# Patient Record
Sex: Male | Born: 1980 | Hispanic: No | Marital: Married | Smoking: Current every day smoker
Health system: Southern US, Community
[De-identification: ages and names within clinical notes are randomized; demographics above are authoritative.]

## PROBLEM LIST (undated history)

## (undated) DIAGNOSIS — H5440 Blindness, one eye, unspecified eye: Secondary | ICD-10-CM

## (undated) DIAGNOSIS — I10 Essential (primary) hypertension: Secondary | ICD-10-CM

## (undated) DIAGNOSIS — J45909 Unspecified asthma, uncomplicated: Secondary | ICD-10-CM

## (undated) DIAGNOSIS — I2510 Atherosclerotic heart disease of native coronary artery without angina pectoris: Secondary | ICD-10-CM

## (undated) DIAGNOSIS — F419 Anxiety disorder, unspecified: Secondary | ICD-10-CM

## (undated) DIAGNOSIS — R0683 Snoring: Secondary | ICD-10-CM

## (undated) DIAGNOSIS — F32A Depression, unspecified: Secondary | ICD-10-CM

## (undated) DIAGNOSIS — E7800 Pure hypercholesterolemia, unspecified: Secondary | ICD-10-CM

## (undated) DIAGNOSIS — Z720 Tobacco use: Secondary | ICD-10-CM

## (undated) DIAGNOSIS — I219 Acute myocardial infarction, unspecified: Secondary | ICD-10-CM

## (undated) DIAGNOSIS — I739 Peripheral vascular disease, unspecified: Secondary | ICD-10-CM

## (undated) DIAGNOSIS — K648 Other hemorrhoids: Secondary | ICD-10-CM

## (undated) DIAGNOSIS — R202 Paresthesia of skin: Secondary | ICD-10-CM

## (undated) DIAGNOSIS — M1990 Unspecified osteoarthritis, unspecified site: Secondary | ICD-10-CM

## (undated) DIAGNOSIS — R7302 Impaired glucose tolerance (oral): Secondary | ICD-10-CM

## (undated) DIAGNOSIS — K644 Residual hemorrhoidal skin tags: Secondary | ICD-10-CM

## (undated) HISTORY — DX: Depression, unspecified: F32.A

## (undated) HISTORY — DX: Impaired glucose tolerance (oral): R73.02

## (undated) HISTORY — DX: Snoring: R06.83

## (undated) HISTORY — DX: Peripheral vascular disease, unspecified: I73.9

## (undated) HISTORY — DX: Anxiety disorder, unspecified: F41.9

## (undated) HISTORY — DX: Paresthesia of skin: R20.2

## (undated) HISTORY — DX: Other hemorrhoids: K64.8

## (undated) HISTORY — PX: OTHER SURGICAL HISTORY: SHX169

## (undated) HISTORY — DX: Residual hemorrhoidal skin tags: K64.4

## (undated) HISTORY — PX: CORONARY ANGIOPLASTY: SHX604

## (undated) HISTORY — DX: Blindness, one eye, unspecified eye: H54.40

## (undated) HISTORY — DX: Essential (primary) hypertension: I10

## (undated) HISTORY — DX: Pure hypercholesterolemia, unspecified: E78.00

## (undated) HISTORY — PX: CARDIAC CATHETERIZATION: SHX172

## (undated) HISTORY — DX: Atherosclerotic heart disease of native coronary artery without angina pectoris: I25.10

## (undated) HISTORY — DX: Tobacco use: Z72.0

---

## 2017-03-07 DIAGNOSIS — E7800 Pure hypercholesterolemia, unspecified: Secondary | ICD-10-CM | POA: Insufficient documentation

## 2017-03-07 DIAGNOSIS — I10 Essential (primary) hypertension: Secondary | ICD-10-CM | POA: Insufficient documentation

## 2017-03-07 DIAGNOSIS — Z720 Tobacco use: Secondary | ICD-10-CM

## 2017-03-07 HISTORY — DX: Pure hypercholesterolemia, unspecified: E78.00

## 2017-03-07 HISTORY — DX: Tobacco use: Z72.0

## 2017-03-07 HISTORY — DX: Essential (primary) hypertension: I10

## 2017-03-15 DIAGNOSIS — R7302 Impaired glucose tolerance (oral): Secondary | ICD-10-CM

## 2017-03-15 HISTORY — DX: Impaired glucose tolerance (oral): R73.02

## 2017-04-02 DIAGNOSIS — R202 Paresthesia of skin: Secondary | ICD-10-CM

## 2017-04-02 DIAGNOSIS — I739 Peripheral vascular disease, unspecified: Secondary | ICD-10-CM

## 2017-04-02 DIAGNOSIS — I2510 Atherosclerotic heart disease of native coronary artery without angina pectoris: Secondary | ICD-10-CM

## 2017-04-02 HISTORY — DX: Paresthesia of skin: R20.2

## 2017-04-02 HISTORY — DX: Peripheral vascular disease, unspecified: I73.9

## 2017-04-02 HISTORY — DX: Atherosclerotic heart disease of native coronary artery without angina pectoris: I25.10

## 2017-09-18 DIAGNOSIS — K648 Other hemorrhoids: Secondary | ICD-10-CM

## 2017-09-18 DIAGNOSIS — K644 Residual hemorrhoidal skin tags: Secondary | ICD-10-CM

## 2017-09-18 HISTORY — DX: Other hemorrhoids: K64.8

## 2017-09-18 HISTORY — DX: Residual hemorrhoidal skin tags: K64.4

## 2018-02-25 ENCOUNTER — Other Ambulatory Visit: Payer: Self-pay

## 2018-02-25 ENCOUNTER — Emergency Department (HOSPITAL_COMMUNITY)
Admission: EM | Admit: 2018-02-25 | Discharge: 2018-02-25 | Disposition: A | Discharge status: Home / Self Care | Payer: Self-pay | Attending: Emergency Medicine | Admitting: Emergency Medicine

## 2018-02-25 DIAGNOSIS — Z5321 Procedure and treatment not carried out due to patient leaving prior to being seen by health care provider: Secondary | ICD-10-CM | POA: Insufficient documentation

## 2018-02-25 DIAGNOSIS — H538 Other visual disturbances: Secondary | ICD-10-CM | POA: Insufficient documentation
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
## 2018-02-25 NOTE — ED Triage Notes (Signed)
Pt reports that he has had impaired vision in his left eye for the last 2 years that began from kidney failure. Pt reports due to financial reasons he has not seen a doctor for over 1 year about his eye. Pt reports on Thursday he has smoking vision on the bottom of his vision field but no other sight in that eye, Friday morning he woke up with no vision in his left eye.

## 2018-02-25 NOTE — ED Provider Notes (Cosign Needed)
Patient placed in Quick Look pathway, seen and evaluated   Chief Complaint: loss of vision left eye  HPI:  Notes Pt reports that he has had impaired vision in his left eye for the last 2 years that began from kidney failure. Pt reports due to financial reasons he has not seen a doctor for over 1 year about his eye. Pt reports on Thursday he has smoking vision on the bottom of his vision field but no other sight in that eye, Friday morning he woke up with no vision in his left eye. patient reports headache for the the past couple weeks. He headache has been frontal and behind the left eye.   ROS: Eyes: loss of vision left  Physical Exam:  BP (!) 167/110 (BP Location: Right Arm)   Pulse 95   Temp 98 F (36.7 C) (Oral)   Resp 16   SpO2 97%    Gen: No distress  Neuro: Awake and Alert  Skin: Warm and dry    Focused Exam:    Initiation of care has begun. The patient has been counseled on the process, plan, and necessity for staying for the completion/evaluation, and the remainder of the medical screening examination      Janne Napoleoneese, Hope M, NP 02/25/18 1401

## 2018-02-25 NOTE — ED Notes (Addendum)
Pt's family member came to desk and notified RN that pt is leaving. RN tried to convince pt to stay. He decided to leave

## 2018-02-26 ENCOUNTER — Encounter (HOSPITAL_COMMUNITY): Payer: Self-pay | Admitting: *Deleted

## 2018-02-26 ENCOUNTER — Other Ambulatory Visit (HOSPITAL_BASED_OUTPATIENT_CLINIC_OR_DEPARTMENT_OTHER): Payer: Self-pay

## 2018-02-26 ENCOUNTER — Other Ambulatory Visit: Payer: Self-pay

## 2018-02-26 ENCOUNTER — Emergency Department (HOSPITAL_COMMUNITY): Payer: Self-pay

## 2018-02-26 DIAGNOSIS — G4730 Sleep apnea, unspecified: Secondary | ICD-10-CM

## 2018-02-26 DIAGNOSIS — G4700 Insomnia, unspecified: Secondary | ICD-10-CM

## 2018-02-26 DIAGNOSIS — G4710 Hypersomnia, unspecified: Secondary | ICD-10-CM

## 2018-02-26 DIAGNOSIS — R51 Headache: Secondary | ICD-10-CM | POA: Insufficient documentation

## 2018-02-26 DIAGNOSIS — G2581 Restless legs syndrome: Secondary | ICD-10-CM

## 2018-02-26 DIAGNOSIS — R454 Irritability and anger: Secondary | ICD-10-CM

## 2018-02-26 DIAGNOSIS — Z5321 Procedure and treatment not carried out due to patient leaving prior to being seen by health care provider: Secondary | ICD-10-CM | POA: Insufficient documentation

## 2018-02-26 DIAGNOSIS — R0683 Snoring: Secondary | ICD-10-CM

## 2018-02-26 DIAGNOSIS — R5383 Other fatigue: Secondary | ICD-10-CM

## 2018-02-26 IMAGING — CT CT HEAD W/O CM
3 series · 16 of 47 positions shown, 19 images · non-contrast
Comparison: None.

CLINICAL DATA: Visual loss or uveitis/scleritis. Frontal headache.
Elevated blood pressure.

EXAM:
CT HEAD WITHOUT CONTRAST
TECHNIQUE: Contiguous axial images were obtained from the base of the skull
through the vertex without intravenous contrast.

[Series 2: head wo · axial · 0.49mm/px · z∈[+1264,+1399]mm · 10 of 33 slices shown, 13 images]
[im 3/33  brain]
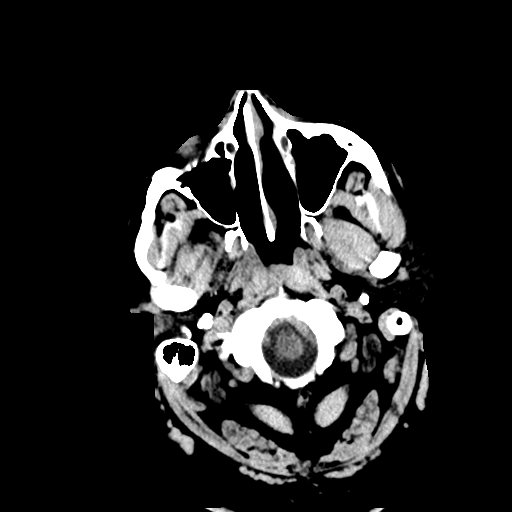
[im 3/33  bone]
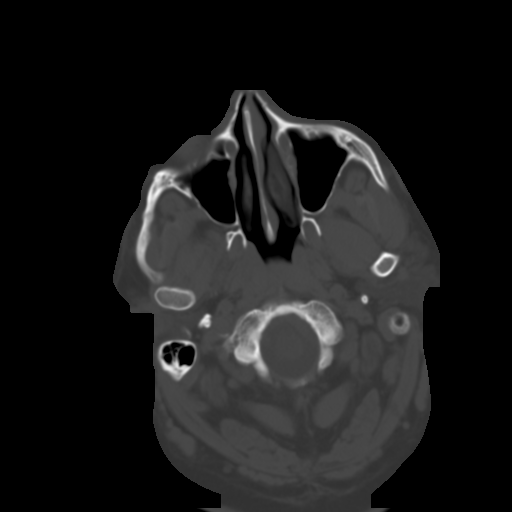
[im 6/33  brain]
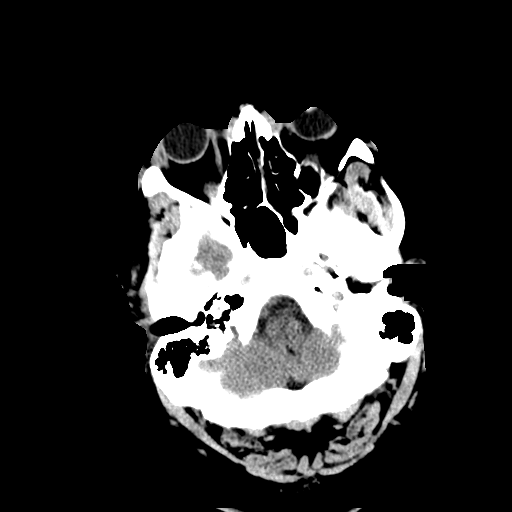
[im 9/33  brain]
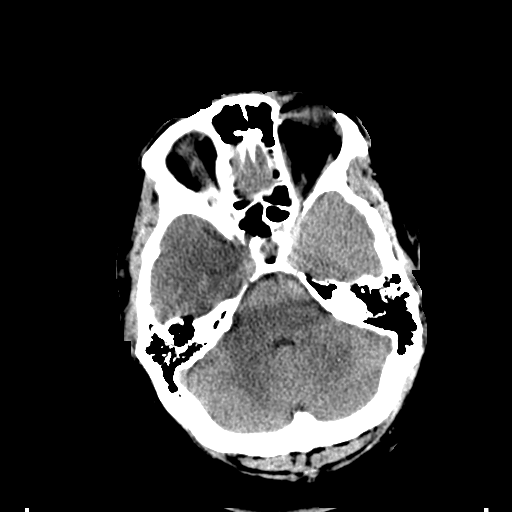
[im 12/33  brain]
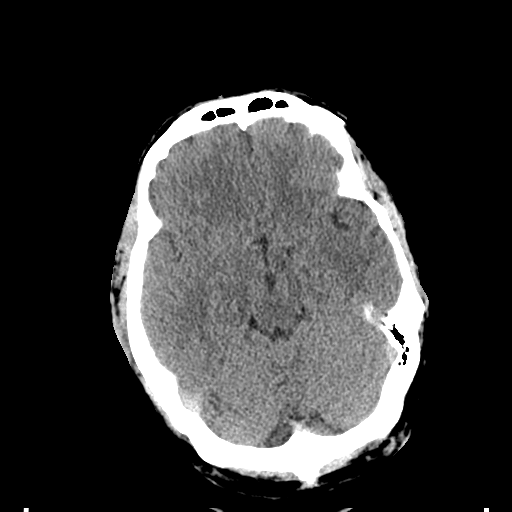
[im 15/33  brain]
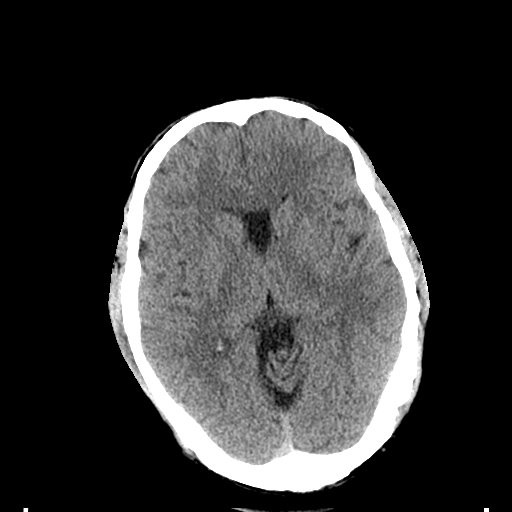
[im 15/33  bone]
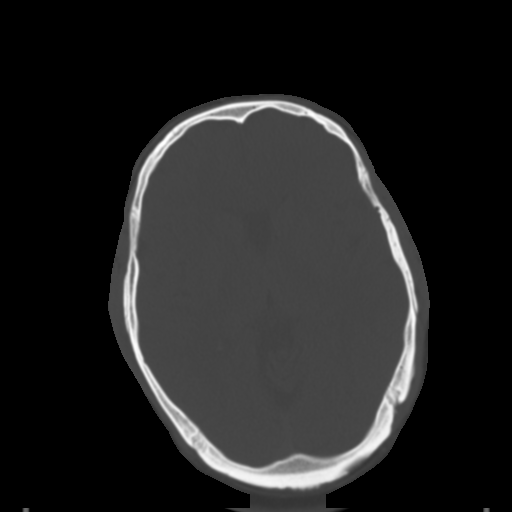
[im 18/33  brain]
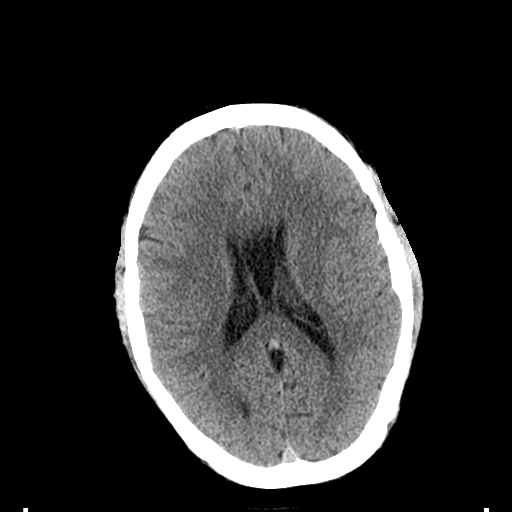
[im 21/33  brain]
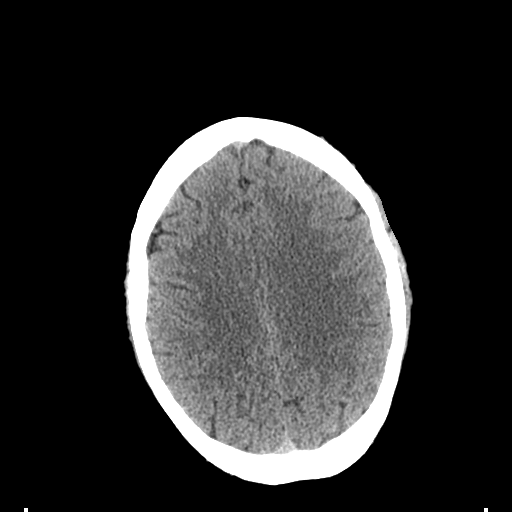
[im 25/33  brain]
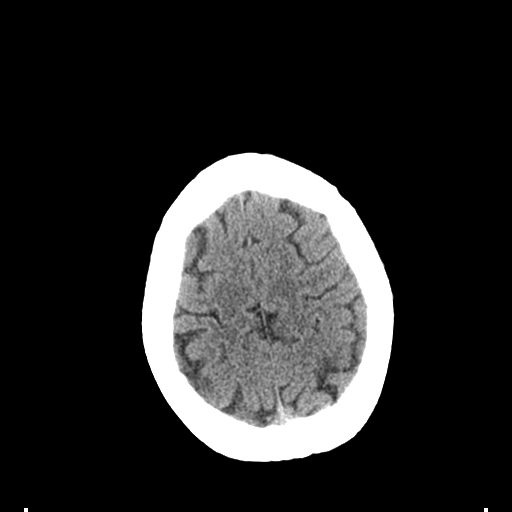
[im 27/33  brain]
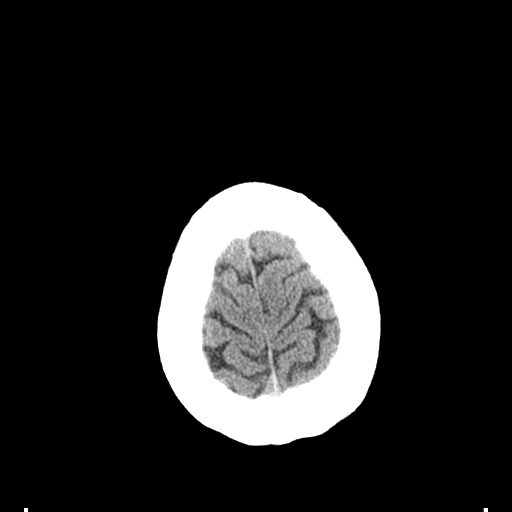
[im 27/33  bone]
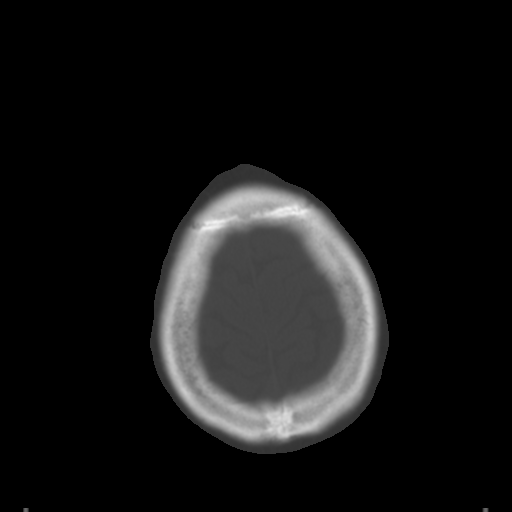
[im 30/33  brain]
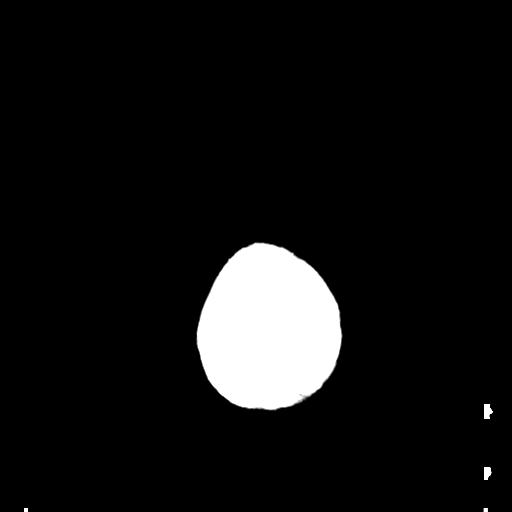

[Series 4: coronal soft tissue · coronal · 0.31mm/px · 3 of 70 slices shown]
[im 24/70  brain]
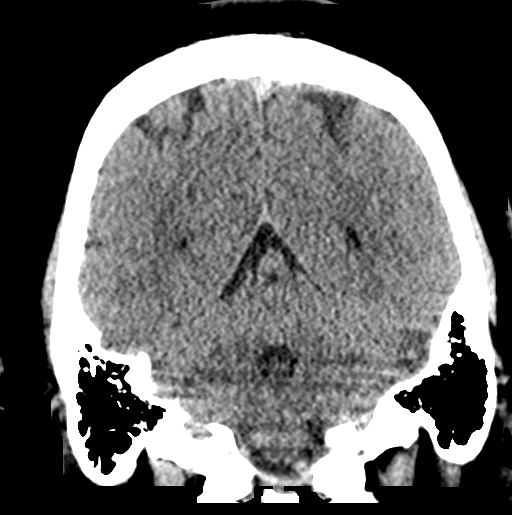
[im 31/70  brain]
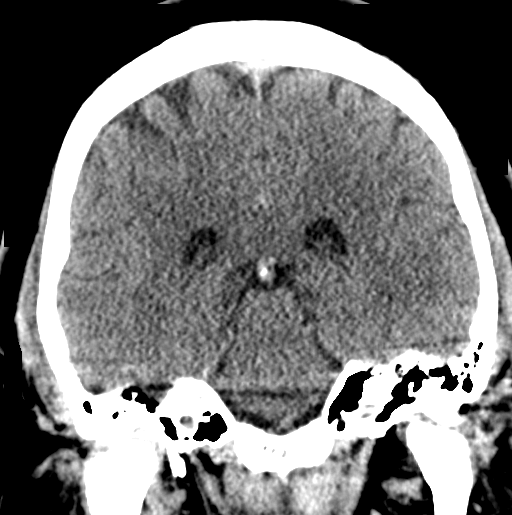
[im 39/70  brain]
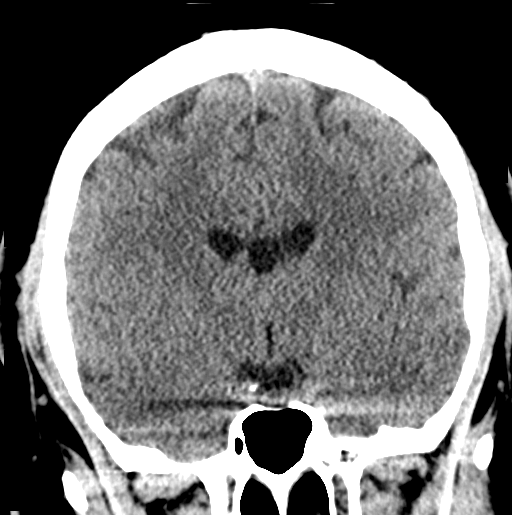

[Series 5: sagittal soft tissue · sagittal · 0.31mm/px · 3 of 54 slices shown]
[im 18/54  brain]
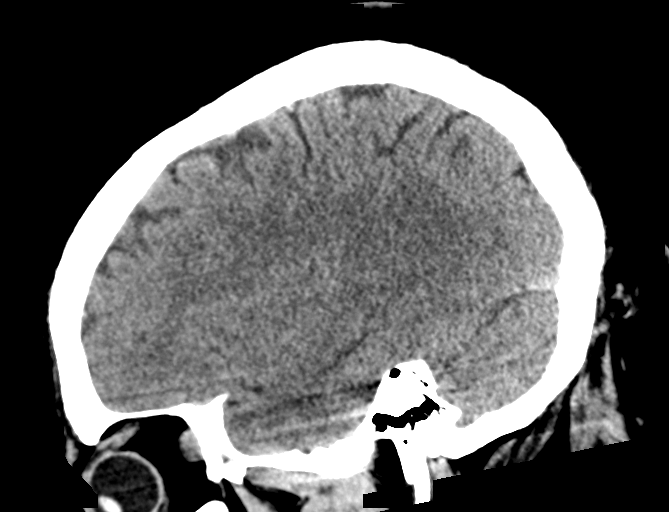
[im 27/54  brain]
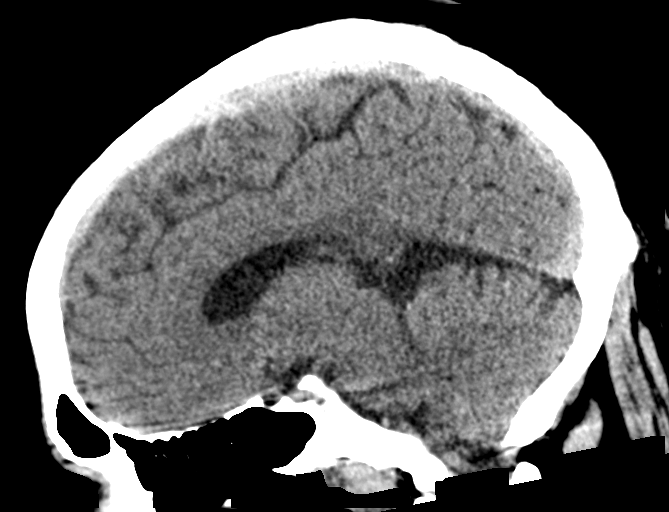
[im 36/54  brain]
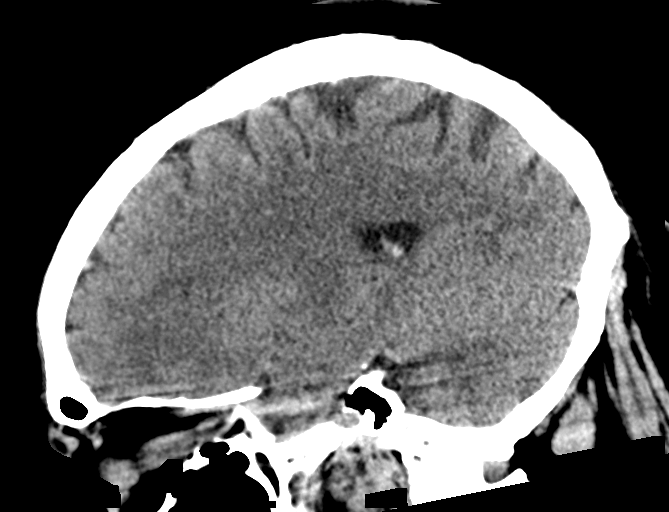

[16 of 47 positions shown; findings below may reference images not displayed]

FINDINGS: Brain: No intracranial hemorrhage, mass effect, or midline shift. No
hydrocephalus. Cavum septum pellucidum, normal variant. The basilar
cisterns are patent. No evidence of territorial infarct or acute
ischemia. No extra-axial or intracranial fluid collection.

Vascular: No hyperdense vessel or unexpected calcification.

Skull: Normal. Negative for fracture or focal lesion.

Sinuses/Orbits: Paranasal sinuses and mastoid air cells are clear.
The visualized orbits are unremarkable. No evidence of orbital
inflammation.

Other: None.
IMPRESSION: Unremarkable noncontrast head CT.

## 2018-02-26 NOTE — ED Notes (Signed)
[facility] EDP made aware of pt's CC.  Will order head CT

## 2018-02-26 NOTE — ED Triage Notes (Signed)
Pt reports frontal head pressure with L vision loss Friday.  [facility], waited for [facility] hours.  He went home without being seen.  Called his PCP today and was instructed to come to the ED.  Pt is hypertensive, has hx of HTN and has taken his med today.  Pt is A&Ox 4.

## 2018-02-27 ENCOUNTER — Emergency Department (HOSPITAL_COMMUNITY)
Admission: EM | Admit: 2018-02-27 | Discharge: 2018-02-27 | Discharge status: Against Medical Advice | Payer: Self-pay | Attending: Emergency Medicine | Admitting: Emergency Medicine

## 2018-02-27 ENCOUNTER — Emergency Department (HOSPITAL_COMMUNITY)
Admission: EM | Admit: 2018-02-27 | Discharge: 2018-02-27 | Disposition: A | Discharge status: Home / Self Care | Payer: Self-pay | Attending: Emergency Medicine | Admitting: Emergency Medicine

## 2018-02-27 ENCOUNTER — Encounter (HOSPITAL_COMMUNITY): Payer: Self-pay | Admitting: Emergency Medicine

## 2018-02-27 DIAGNOSIS — Z79899 Other long term (current) drug therapy: Secondary | ICD-10-CM | POA: Insufficient documentation

## 2018-02-27 DIAGNOSIS — I10 Essential (primary) hypertension: Secondary | ICD-10-CM | POA: Insufficient documentation

## 2018-02-27 DIAGNOSIS — Z7902 Long term (current) use of antithrombotics/antiplatelets: Secondary | ICD-10-CM | POA: Insufficient documentation

## 2018-02-27 DIAGNOSIS — H5462 Unqualified visual loss, left eye, normal vision right eye: Secondary | ICD-10-CM | POA: Insufficient documentation

## 2018-02-27 DIAGNOSIS — F17210 Nicotine dependence, cigarettes, uncomplicated: Secondary | ICD-10-CM | POA: Insufficient documentation

## 2018-02-27 DIAGNOSIS — J45909 Unspecified asthma, uncomplicated: Secondary | ICD-10-CM | POA: Insufficient documentation

## 2018-02-27 HISTORY — DX: Acute myocardial infarction, unspecified: I21.9

## 2018-02-27 HISTORY — DX: Unspecified osteoarthritis, unspecified site: M19.90

## 2018-02-27 HISTORY — DX: Essential (primary) hypertension: I10

## 2018-02-27 HISTORY — DX: Unspecified asthma, uncomplicated: J45.909

## 2018-02-27 LAB — CBC WITH DIFFERENTIAL/PLATELET
BASOS ABS: 0 10*3/uL (ref 0.0–0.1)
BASOS PCT: 0 %
EOS ABS: 0.5 10*3/uL (ref 0.0–0.7)
Eosinophils Relative: 4 %
HCT: 44.5 % (ref 39.0–52.0)
HEMOGLOBIN: 15.3 g/dL (ref 13.0–17.0)
Lymphocytes Relative: 34 %
Lymphs Abs: 4.4 10*3/uL — ABNORMAL HIGH (ref 0.7–4.0)
MCH: 30.8 pg (ref 26.0–34.0)
MCHC: 34.4 g/dL (ref 30.0–36.0)
MCV: 89.7 fL (ref 78.0–100.0)
Monocytes Absolute: 0.7 10*3/uL (ref 0.1–1.0)
Monocytes Relative: 6 %
NEUTROS PCT: 56 %
Neutro Abs: 7.3 10*3/uL (ref 1.7–7.7)
PLATELETS: 303 10*3/uL (ref 150–400)
RBC: 4.96 MIL/uL (ref 4.22–5.81)
RDW: 13.6 % (ref 11.5–15.5)
WBC: 13 10*3/uL — ABNORMAL HIGH (ref 4.0–10.5)

## 2018-02-27 LAB — COMPREHENSIVE METABOLIC PANEL
ALBUMIN: 4.1 g/dL (ref 3.5–5.0)
ALT: 35 U/L (ref 17–63)
ANION GAP: 9 (ref 5–15)
AST: 26 U/L (ref 15–41)
Alkaline Phosphatase: 84 U/L (ref 38–126)
BUN: 11 mg/dL (ref 6–20)
CO2: 27 mmol/L (ref 22–32)
Calcium: 9.3 mg/dL (ref 8.9–10.3)
Chloride: 103 mmol/L (ref 101–111)
Creatinine, Ser: 0.85 mg/dL (ref 0.61–1.24)
GFR calc non Af Amer: >60 mL/min (ref >60)
GLUCOSE: 192 mg/dL — AB (ref 65–99)
POTASSIUM: 3.7 mmol/L (ref 3.5–5.1)
SODIUM: 139 mmol/L (ref 135–145)
TOTAL PROTEIN: 7.7 g/dL (ref 6.5–8.1)
Total Bilirubin: 0.4 mg/dL (ref 0.3–1.2)

## 2018-02-27 LAB — APTT: aPTT: 32 seconds (ref 24–36)

## 2018-02-27 LAB — I-STAT TROPONIN, ED: Troponin i, poc: 0.03 ng/mL (ref 0.00–0.08)

## 2018-02-27 LAB — PROTIME-INR
INR: 0.94
PROTHROMBIN TIME: 12.5 s (ref 11.4–15.2)

## 2018-02-27 LAB — CBG MONITORING, ED: GLUCOSE-CAPILLARY: 189 mg/dL — AB (ref 65–99)

## 2018-02-27 MED ORDER — TETRACAINE HCL 0.5 % OP SOLN
1.0000 [drp] | Freq: Once | OPHTHALMIC | Status: AC
  Administered 2018-02-27: 1 [drp] via OPHTHALMIC
  Filled 2018-02-27: qty 4

## 2018-02-27 MED ORDER — FLUORESCEIN SODIUM 1 MG OP STRP
1.0000 | ORAL_STRIP | Freq: Once | OPHTHALMIC | Status: AC
Start: 2018-02-27 — End: 2018-02-27
  Administered 2018-02-27: 1 via OPHTHALMIC
  Filled 2018-02-27: qty 1

## 2018-02-27 NOTE — ED Provider Notes (Addendum)
Medical screening examination/treatment/procedure(s) were conducted as a shared visit with non-physician practitioner(s) and myself.  I personally evaluated the patient during the encounter.  None   Patient seen by me along with physician assistant.  Patient with known previous poor vision in left eye secondary to what he says was a retinal problem.  On Friday of this week patient had complete loss of vision in that eye.  Associated with some discomfort up in the left upper forehead area.  No distinct eye pain.  Patient also usually wears a patch routinely over the left eye when he is out in the sunlight.  Patient not followed by ophthalmology here.  Slit-lamp exam without any acute findings.  Ocular pressures are normal in both eyes.  Patient's left eye is reactive to light suggesting that is proceeding light.  Patient states he has no vision.  No obvious retinal problem on limited exam.  Will discuss with ophthalmology on-call.  Suspect patient can be seen as an outpatient based on the duration.  [facility] but left due to long wait.  Head CT had no acute abnormalities.  We have ordered basic labs here and are waiting on results.   Vanetta MuldersZackowski, Kendrick Remigio, MD 02/27/18 1805   In addition patient no acute distress.  Do not feel that the loss of vision is related to migraine.   Vanetta MuldersZackowski, Jaydence Vanyo, MD 02/27/18 1807
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
## 2018-02-27 NOTE — ED Notes (Signed)
Pt is sleeping in bed

## 2018-02-27 NOTE — ED Notes (Signed)
Pt is texting on phone. Spouse has left bedside.

## 2018-02-27 NOTE — ED Notes (Signed)
ED Provider at bedside. 

## 2018-02-27 NOTE — ED Triage Notes (Signed)
Pt repots that having headaches that have been intermittent since last Friday that is made worse by light.
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
## 2018-02-27 NOTE — ED Notes (Signed)
Pt no longer in lobby.  

## 2018-03-13 ENCOUNTER — Ambulatory Visit (HOSPITAL_BASED_OUTPATIENT_CLINIC_OR_DEPARTMENT_OTHER): Payer: Self-pay | Attending: *Deleted | Admitting: Internal Medicine

## 2018-03-13 VITALS — Ht 74.0 in | Wt 364.0 lb

## 2018-03-13 DIAGNOSIS — Z79899 Other long term (current) drug therapy: Secondary | ICD-10-CM | POA: Insufficient documentation

## 2018-03-13 DIAGNOSIS — G4733 Obstructive sleep apnea (adult) (pediatric): Secondary | ICD-10-CM | POA: Insufficient documentation

## 2018-03-13 DIAGNOSIS — E669 Obesity, unspecified: Secondary | ICD-10-CM | POA: Insufficient documentation

## 2018-03-13 DIAGNOSIS — R0683 Snoring: Secondary | ICD-10-CM | POA: Insufficient documentation

## 2018-03-13 DIAGNOSIS — G2581 Restless legs syndrome: Secondary | ICD-10-CM

## 2018-03-13 DIAGNOSIS — R454 Irritability and anger: Secondary | ICD-10-CM

## 2018-03-13 DIAGNOSIS — G4730 Sleep apnea, unspecified: Secondary | ICD-10-CM

## 2018-03-13 DIAGNOSIS — G4710 Hypersomnia, unspecified: Secondary | ICD-10-CM | POA: Insufficient documentation

## 2018-03-13 DIAGNOSIS — R5383 Other fatigue: Secondary | ICD-10-CM | POA: Insufficient documentation

## 2018-03-13 DIAGNOSIS — I10 Essential (primary) hypertension: Secondary | ICD-10-CM | POA: Insufficient documentation

## 2018-03-13 DIAGNOSIS — Z6841 Body Mass Index (BMI) 40.0 and over, adult: Secondary | ICD-10-CM | POA: Insufficient documentation

## 2018-03-13 DIAGNOSIS — I493 Ventricular premature depolarization: Secondary | ICD-10-CM | POA: Insufficient documentation

## 2018-03-13 DIAGNOSIS — G4700 Insomnia, unspecified: Secondary | ICD-10-CM

## 2018-03-13 HISTORY — DX: Snoring: R06.83

## 2018-03-23 DIAGNOSIS — R0683 Snoring: Secondary | ICD-10-CM

## 2018-03-23 NOTE — Procedures (Signed)
Patient Name: Jeffrey Barrera, Jeffrey Barrera Date: 03/13/2018 Gender: Male D.O.B: 07-Jan-1981 Age (years): 72 Referring Provider: Marliss Coots NP Height (inches): 74 Interpreting Physician: Baird Lyons MD, ABSM Weight (lbs): 364 RPSGT: Carolin Coy BMI: 64 MRN: [id number] Neck Size: 18.00  CLINICAL INFORMATION Sleep Study Type: Split Night CPAP Indication for sleep study: Excessive Daytime Sleepiness, Fatigue, Hypertension, Obesity, Snoring, Witnessed Apneas  Epworth Sleepiness Score:  5  SLEEP STUDY TECHNIQUE As per the AASM Manual for the Scoring of Sleep and Associated Events v2.3 (April 2016) with a hypopnea requiring 4% desaturations.  The channels recorded and monitored were frontal, central and occipital EEG, electrooculogram (EOG), submentalis EMG (chin), nasal and oral airflow, thoracic and abdominal wall motion, anterior tibialis EMG, snore microphone, electrocardiogram, and pulse oximetry. Continuous positive airway pressure (CPAP) was initiated when the patient met split night criteria and was titrated according to treat sleep-disordered breathing.  MEDICATIONS Medications self-administered by patient taken the night of the study : PRAZOSIN, GABAPENTIN, TRAZODONE  RESPIRATORY PARAMETERS Diagnostic  Total AHI (/hr): 20.2 RDI (/hr): 34.1 OA Index (/hr): 3.4 CA Index (/hr): 0.0 REM AHI (/hr): N/A NREM AHI (/hr): 20.2 Supine AHI (/hr): 66.7 Non-supine AHI (/hr): 18.42 Min O2 Sat (%): 88.0 Mean O2 (%): 92.8 Time below 88% (min): 0   Titration  Optimal Pressure (cm):  AHI at Optimal Pressure (/hr): N/A Min O2 at Optimal Pressure (%): 87.0 Supine % at Optimal (%): N/A Sleep % at Optimal (%): N/A   SLEEP ARCHITECTURE The recording time for the entire night was 362 minutes.  During a baseline period of 169.2 minutes, the patient slept for 125.0 minutes in REM and nonREM, yielding a sleep efficiency of 73.9%%. Sleep onset after lights out was 1.5 minutes with a REM  latency of N/A minutes. The patient spent 44.8%% of the night in stage N1 sleep, 55.2%% in stage N2 sleep, 0.0%% in stage N3 and 0.0%% in REM.  During the titration period of 185.6 minutes, the patient slept for 159.0 minutes in REM and nonREM, yielding a sleep efficiency of 85.6%%. Sleep onset after CPAP initiation was 14.2 minutes with a REM latency of 86.0 minutes. The patient spent 14.8%% of the night in stage N1 sleep, 74.8%% in stage N2 sleep, 0.0%% in stage N3 and 10.4%% in REM.  CARDIAC DATA The 2 lead EKG demonstrated sinus rhythm. The mean heart rate was 100.0 beats per minute. Other EKG findings include: PVCs.  LEG MOVEMENT DATA The total Periodic Limb Movements of Sleep (PLMS) were 0. The PLMS index was 0.0 .  IMPRESSIONS - Moderate obstructive sleep apnea occurred during the diagnostic portion of the study(AHI = 20.2/hour). - CPAP titrated to 16 cwp. - No significant central sleep apnea occurred during the diagnostic portion of the study (CAI = 0.0/hour). - Mild oxygen desaturation was noted during the diagnostic portion of the study (Min O2 = 88.0%). Min sat at CPAP16 was 93%. - The patient snored with moderate snoring volume during the diagnostic portion of the study. - EKG findings include PVCs. - Clinically significant periodic limb movements did not occur during sleep. - Sleep position on back reportedly aoided due to pain.  DIAGNOSIS - Obstructive Sleep Apnea (327.23 [G47.33 ICD-10])  RECOMMENDATIONS - Recommend trial of CPAP 16 or DME autopap 10-20. Patient wore a large Respironics Dreamwear nasal/oral mask with heated hose and humidifier. - Be careful with alcohol, sedatives and other CNS depressants that may worsen sleep apnea and disrupt normal sleep architecture. - Sleep hygiene should  be reviewed to assess factors that may improve sleep quality. - Weight management and regular exercise should be initiated or continued.  [Electronically signed] 03/23/2018 03:14  PM  Baird Lyons MD, [place], American Board of Sleep Medicine   NPI: [id number]                          [place], [place] [facility]  ELECTRONICALLY SIGNED ON:  03/23/2018, 3:09 PM [place] PH: [phone]   FX: [phone] [place]

## 2018-03-27 ENCOUNTER — Other Ambulatory Visit: Payer: Self-pay

## 2018-03-27 ENCOUNTER — Emergency Department (HOSPITAL_COMMUNITY)
Admission: EM | Admit: 2018-03-27 | Discharge: 2018-03-28 | Disposition: A | Discharge status: Home / Self Care | Payer: Self-pay | Attending: Emergency Medicine | Admitting: Emergency Medicine

## 2018-03-27 ENCOUNTER — Emergency Department (HOSPITAL_COMMUNITY): Payer: Self-pay

## 2018-03-27 ENCOUNTER — Encounter (HOSPITAL_COMMUNITY): Payer: Self-pay | Admitting: Emergency Medicine

## 2018-03-27 DIAGNOSIS — I10 Essential (primary) hypertension: Secondary | ICD-10-CM | POA: Insufficient documentation

## 2018-03-27 DIAGNOSIS — I252 Old myocardial infarction: Secondary | ICD-10-CM | POA: Insufficient documentation

## 2018-03-27 DIAGNOSIS — F17210 Nicotine dependence, cigarettes, uncomplicated: Secondary | ICD-10-CM | POA: Insufficient documentation

## 2018-03-27 DIAGNOSIS — Z7901 Long term (current) use of anticoagulants: Secondary | ICD-10-CM | POA: Insufficient documentation

## 2018-03-27 DIAGNOSIS — R079 Chest pain, unspecified: Secondary | ICD-10-CM | POA: Insufficient documentation

## 2018-03-27 DIAGNOSIS — J45909 Unspecified asthma, uncomplicated: Secondary | ICD-10-CM | POA: Insufficient documentation

## 2018-03-27 DIAGNOSIS — Z79899 Other long term (current) drug therapy: Secondary | ICD-10-CM | POA: Insufficient documentation

## 2018-03-27 LAB — CBC
HEMATOCRIT: 42.8 % (ref 39.0–52.0)
HEMOGLOBIN: 14.4 g/dL (ref 13.0–17.0)
MCH: 30.3 pg (ref 26.0–34.0)
MCHC: 33.6 g/dL (ref 30.0–36.0)
MCV: 90.1 fL (ref 78.0–100.0)
Platelets: 333 10*3/uL (ref 150–400)
RBC: 4.75 MIL/uL (ref 4.22–5.81)
RDW: 13.4 % (ref 11.5–15.5)
WBC: 11.1 10*3/uL — ABNORMAL HIGH (ref 4.0–10.5)

## 2018-03-27 LAB — I-STAT TROPONIN, ED: Troponin i, poc: 0.01 ng/mL (ref 0.00–0.08)

## 2018-03-27 LAB — BASIC METABOLIC PANEL
ANION GAP: 11 (ref 5–15)
BUN: 12 mg/dL (ref 6–20)
CHLORIDE: 104 mmol/L (ref 98–111)
CO2: 26 mmol/L (ref 22–32)
Calcium: 9.6 mg/dL (ref 8.9–10.3)
Creatinine, Ser: 0.81 mg/dL (ref 0.61–1.24)
GFR calc non Af Amer: >60 mL/min (ref >60)
Glucose, Bld: 201 mg/dL — ABNORMAL HIGH (ref 70–99)
POTASSIUM: 3.5 mmol/L (ref 3.5–5.1)
Sodium: 141 mmol/L (ref 135–145)

## 2018-03-27 IMAGING — CR DG CHEST 2V
2 series · 2 of 2 positions shown · non-contrast
Comparison: None.

CLINICAL DATA: Left-sided chest pain

EXAM:
CHEST - 2 VIEW

[w chest pa]
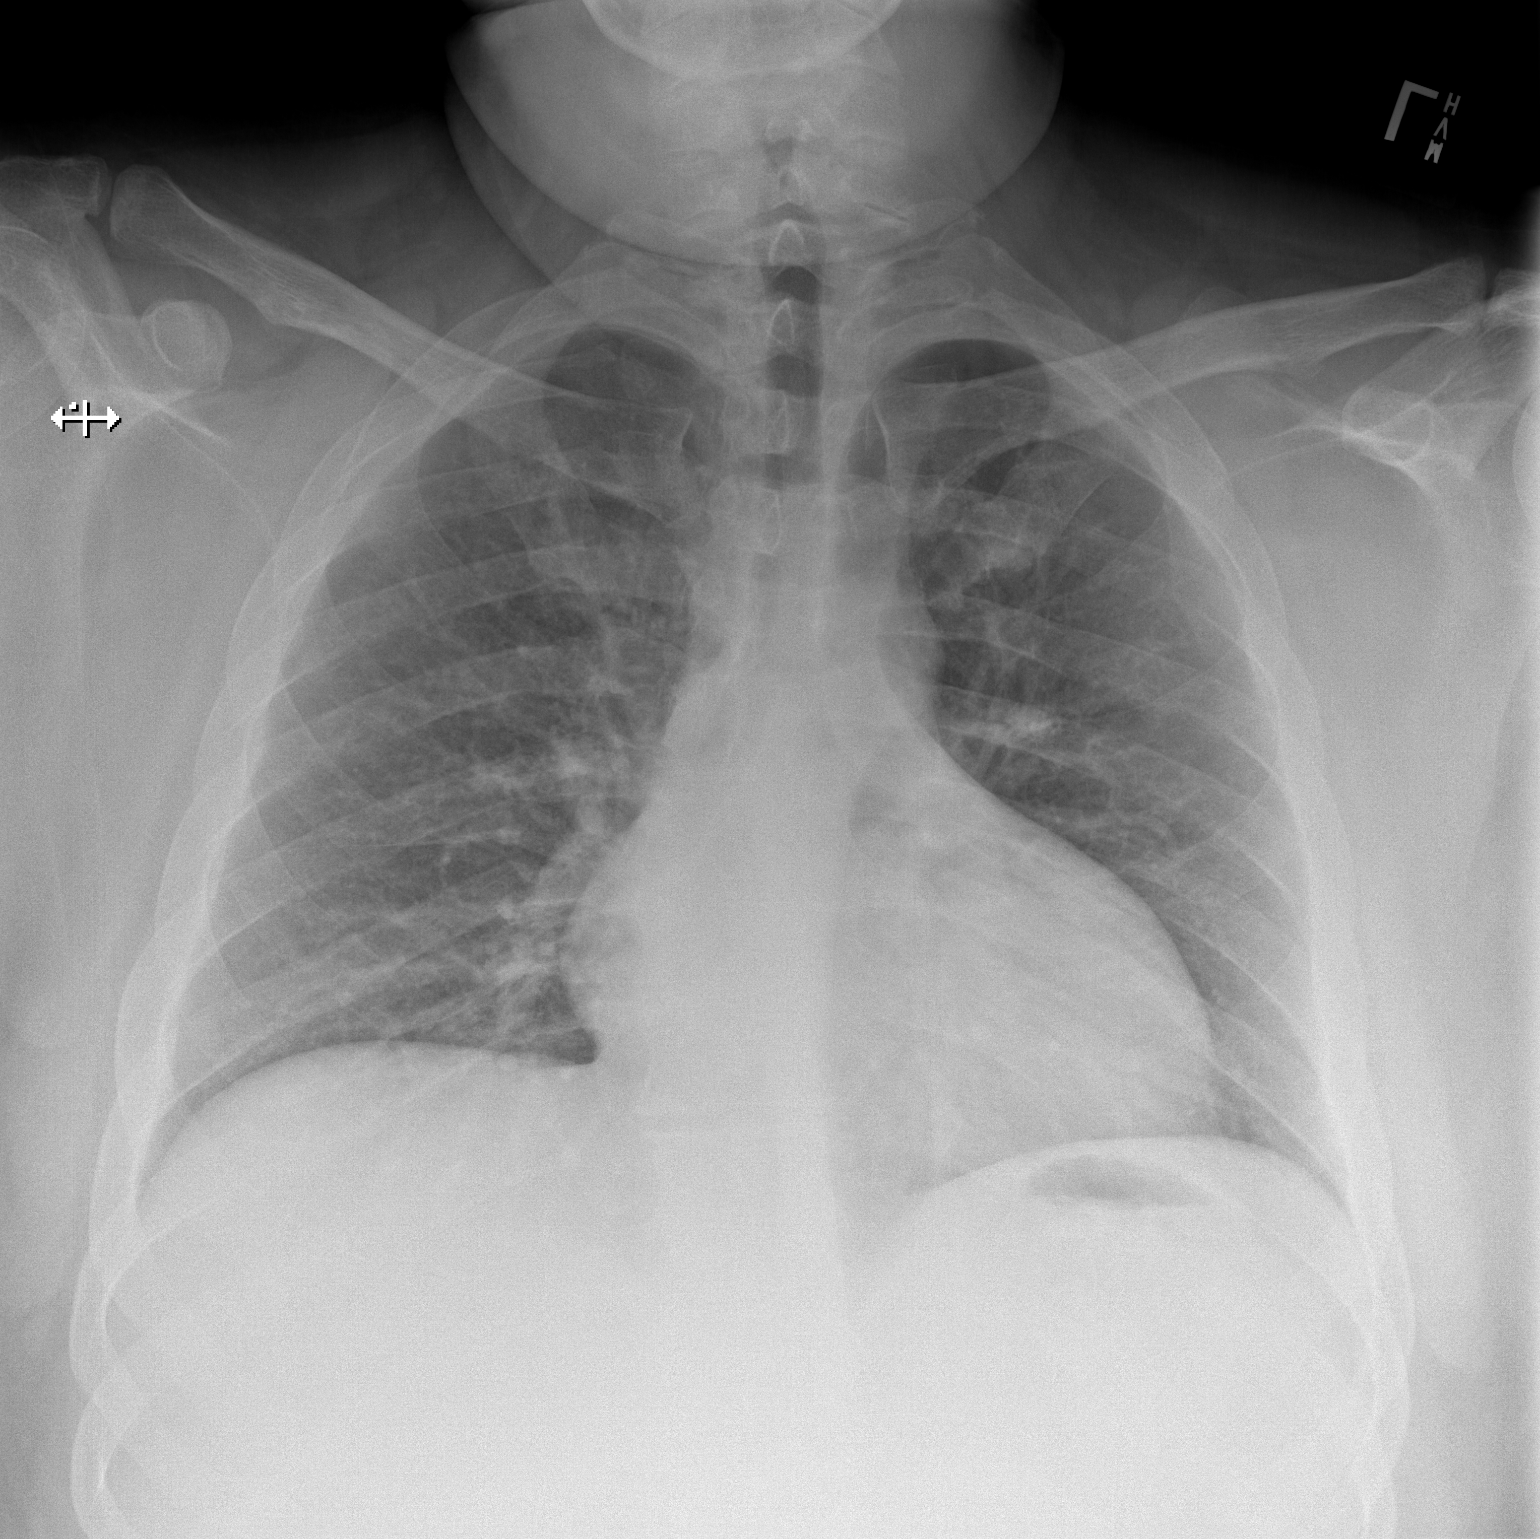

[w chest lat]
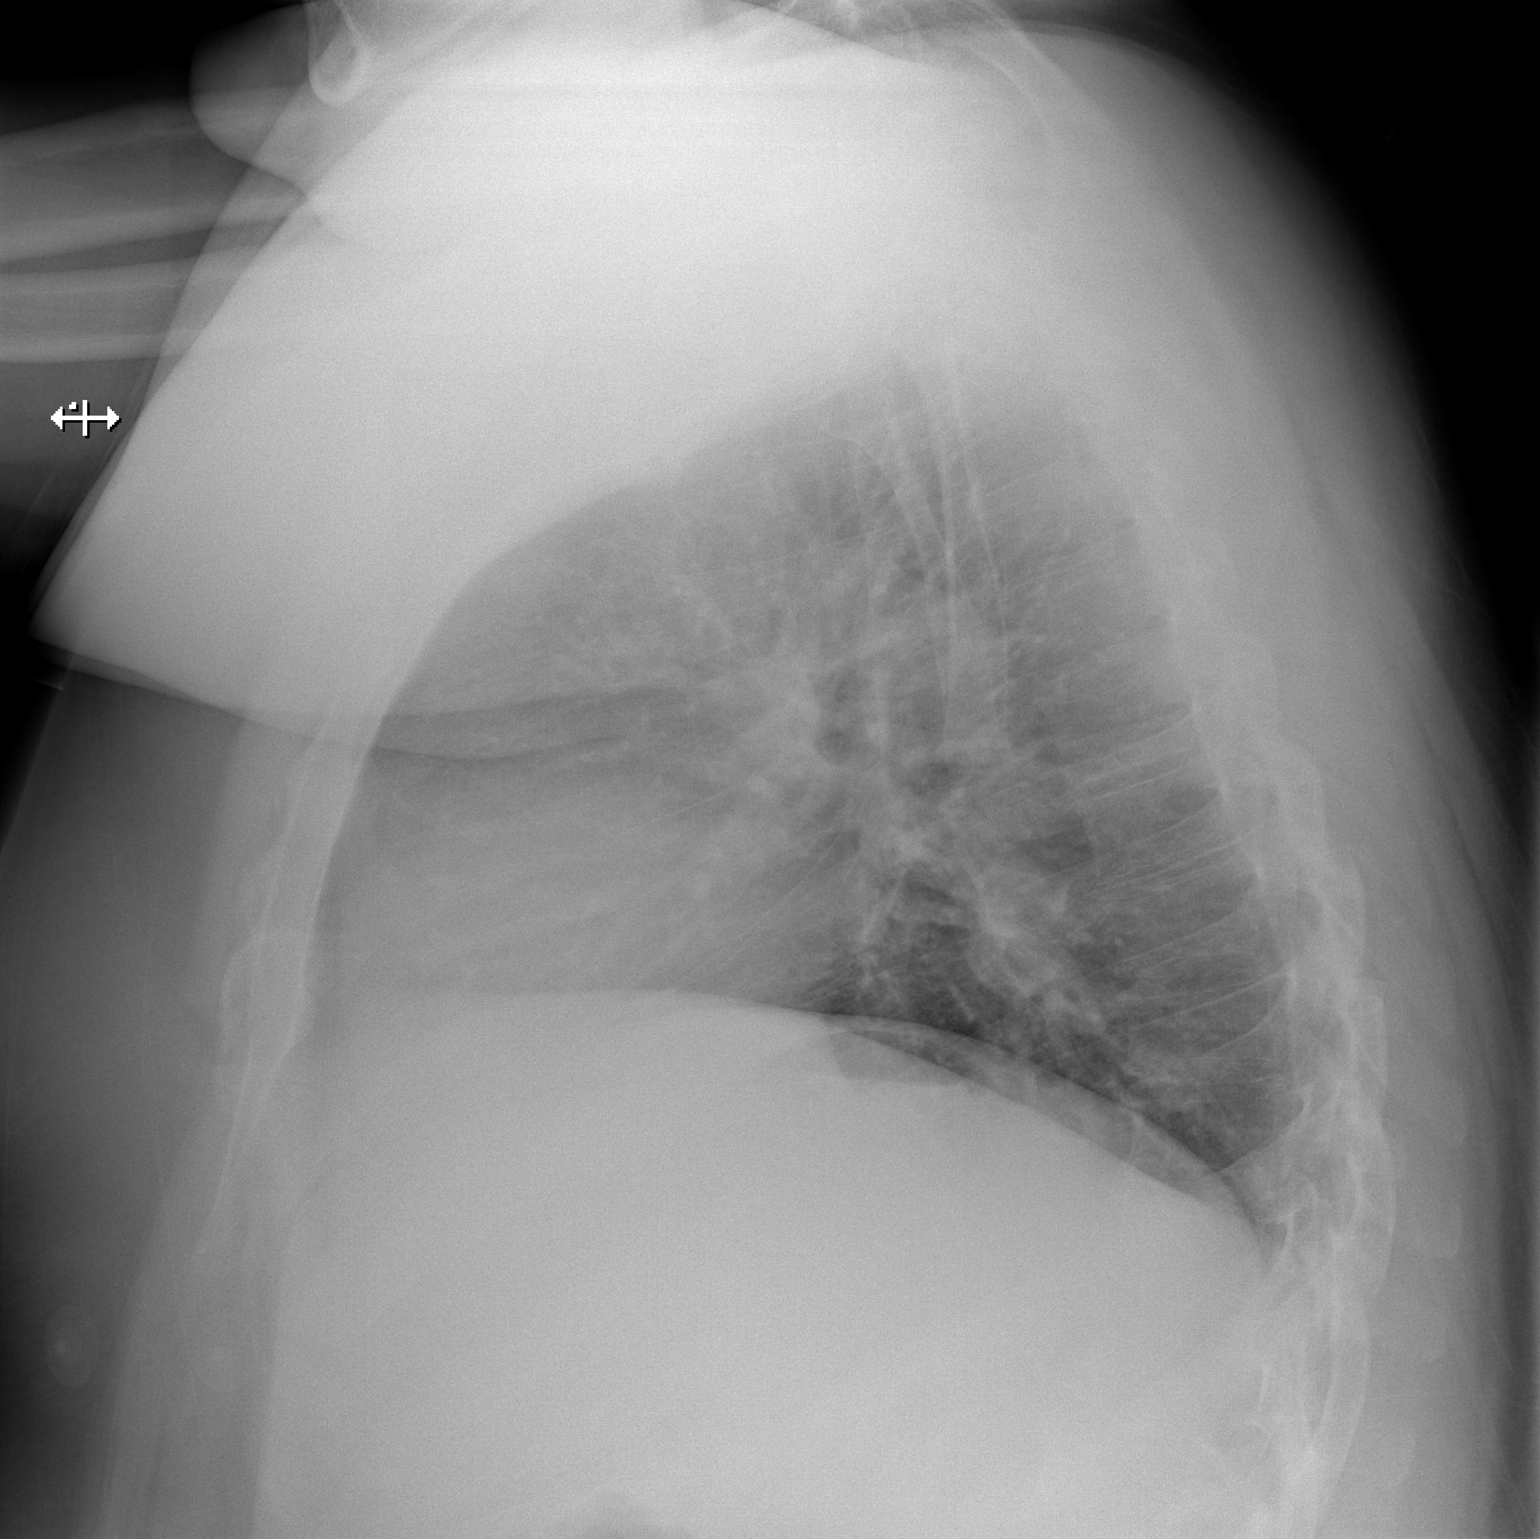

[2 of 2 positions shown; findings below may reference images not displayed]

FINDINGS: Borderline enlarged cardiac silhouette. Nonaneurysmal thoracic
aorta. Mild central vascular congestion without pulmonary
consolidation. No effusion or pneumothorax. No acute nor suspicious
osseous abnormalities.
IMPRESSION: 1. Borderline cardiomegaly with mild vascular congestion.
2. No acute pulmonary consolidation or pneumothorax.
3. No acute osseous abnormality.

## 2018-03-27 NOTE — ED Provider Notes (Signed)
[facility]   CSN: [id number] Arrival date & time: 03/27/18  2050    History   Chief Complaint Chief Complaint  Patient presents with  . Chest Pain    HPI Jeffrey [name] is a 37 y.o. male.  37 y/o male with hx of CAD and NSTEMI with PCIx2 02/2017 (on daily Plavix), asthma, hypertension, hyperlipidemia presents to the emergency department for evaluation of chest pain.  He states that he has had a central, substernal chest pain described as sharp and pressure-like.  It will last a few seconds to minutes before resolving.  This pain has been sporadic over the past 2 days.  It will improve when he lies on his stomach.  Patient denies aggravation of his pain with exertion.  It will not specifically occur with ambulation or strenuous activity.  He reports that he gets slight shortness of breath when his pain is present.  No diaphoresis, nausea, vomiting, syncope or near syncope, leg swelling.  No recent fevers.  He has been compliant with all of his daily medications.  He has not seen a cardiologist since 2018; [place]. Hx of tobacco abuse, but has largely quit since his admission for NSTEMI.  Usually uses nicotine patches.   Chest Pain      Past Medical History:  Diagnosis Date  . Arthritis   . Asthma   . Hypertension   . Myocardial infarction [facility](HCC)     Patient Active Problem List   Diagnosis Date Noted  . Snoring 03/13/2018    Past Surgical History:  Procedure Laterality Date  . cardiac stents          Home Medications    Prior to Admission medications   Medication Sig Start Date End Date Taking? Authorizing Provider  amLODipine (NORVASC) 10 MG tablet Take 10 mg by mouth daily.   Yes [provider]  atorvastatin (LIPITOR) 40 MG tablet Take 40 mg by mouth at bedtime.   Yes [provider]  clopidogrel (PLAVIX) 75 MG tablet Take  75 mg by mouth daily.   Yes [provider]  gabapentin (NEURONTIN) 400 MG capsule Take 800 mg by mouth 3 (three) times daily.    Yes [provider]  hydrochlorothiazide (HYDRODIURIL) 25 MG tablet Take 25 mg by mouth daily.   Yes [provider]  hydrOXYzine (VISTARIL) 50 MG capsule Take 50 mg by mouth 3 (three) times daily.   Yes [provider]  metoprolol tartrate (LOPRESSOR) 25 MG tablet Take 25 mg by mouth 2 (two) times daily.   Yes [provider]  naproxen sodium (ALEVE) 220 MG tablet Take 220 mg by mouth 2 (two) times daily as needed (headache).   Yes [provider]  prazosin (MINIPRESS) 1 MG capsule Take 1 mg by mouth at bedtime.   Yes [provider]  traZODone (DESYREL) 100 MG tablet Take 100 mg by mouth at bedtime.   Yes [provider]    Family History No family history on file.  Social History Social History   Tobacco Use  . Smoking status: Current Every Day Smoker    Types: Cigarettes  . Smokeless tobacco: Never Used  . Tobacco comment: 4 cigarettes   Substance Use Topics  . Alcohol use: Not Currently  . Drug use: Not Currently     Allergies   Penicillins; Asa [aspirin]; and Erythromycin   Review of Systems Review of Systems  Cardiovascular: Positive for chest pain.  Ten systems reviewed and are negative for acute change, except as noted in the HPI.    Physical Exam Updated Vital Signs BP 136/90   Pulse 79   Temp 98.3 F (36.8 C)   Resp (!) 21   Ht 6\' 3"  (1.905 m)   Wt (!) 165.1 kg (364 lb)   SpO2 98%   BMI 45.50 kg/m   Physical Exam  Constitutional: He is oriented to person, place, and time. He appears well-developed and well-nourished. No distress.  Obese, nontoxic.  HENT:  Head: Normocephalic and atraumatic.  Eyes: Conjunctivae and EOM are normal. No scleral icterus.  Neck: Normal range of motion.  Cardiovascular: Normal rate, regular rhythm and intact distal pulses.    Pulmonary/Chest: Effort normal. No stridor. No respiratory distress. He has no wheezes. He has no rales.  Lungs CTAB. Respirations even and unlabored.  Musculoskeletal: Normal range of motion.  No lower extremity pitting edema.  Neurological: He is alert and oriented to person, place, and time. He exhibits normal muscle tone. Coordination normal.  Skin: Skin is warm and dry. No rash noted. He is not diaphoretic. No erythema. No pallor.  Psychiatric: He has a normal mood and affect. His behavior is normal.  Nursing note and vitals reviewed.    ED Treatments / Results  Labs (all labs ordered are listed, but only abnormal results are displayed) Labs Reviewed  BASIC METABOLIC PANEL - Abnormal; Notable for the following components:      Result Value   Glucose, Bld 201 (*)    All other components within normal limits  CBC - Abnormal; Notable for the following components:   WBC 11.1 (*)    All other components within normal limits  I-STAT TROPONIN, ED  I-STAT TROPONIN, ED    EKG ED ECG REPORT   Date: 03/28/2018  Rate: 84  Rhythm: normal sinus rhythm  QRS Axis: normal  Intervals: normal  ST/T Wave abnormalities: nonspecific ST changes  Conduction Disutrbances:RSR' in V1 or V2, right VCD or RVH  Narrative Interpretation: NSR with RSR' in V1 or V2; no STEMI  Old EKG Reviewed: none available  I have personally reviewed the EKG tracing and agree with the computerized printout as noted.   Radiology Dg Chest 2 View  Result Date: 03/27/2018 CLINICAL DATA:  Left-sided chest pain EXAM: CHEST - 2 VIEW COMPARISON:  None. FINDINGS: Borderline enlarged cardiac silhouette. Nonaneurysmal thoracic aorta. Mild central vascular congestion without pulmonary consolidation. No effusion or pneumothorax. No acute nor suspicious osseous abnormalities. IMPRESSION: 1. Borderline cardiomegaly with mild vascular congestion. 2. No acute pulmonary consolidation or pneumothorax. 3. No acute osseous  abnormality. Electronically Signed   By: Tollie Eth M.D.   On: 03/27/2018 21:46    Procedures Procedures (including critical care time)  Medications Ordered in ED Medications - No data to display   Initial Impression / Assessment and Plan / ED Course  I have reviewed the triage vital signs and the nursing notes.  Pertinent labs & imaging results that were available during my care of the patient were reviewed by me and considered in my medical decision making (see chart for details).     Patient presents to the emergency department for evaluation of chest pain.  Cardiac work up today is reassuring.  EKG is nonischemic and troponin negative x2.  Patient has a heart score of 3 consistent with low risk of acute coronary event (slight suspicion, age, risk factors).  Chest x-ray without evidence  of mediastinal widening to suggest dissection.  No pneumothorax, pneumonia, pleural effusion.  Pulmonary embolus further considered; however, patient without tachycardia, tachypnea, dyspnea, hypoxia.  Patient is PERC negative.  The patient does have a history of an STEMI and is status post PCI 1 year ago.  He was found to have 90% stenosis to his RCA.  Only 20 to 30% stenosis of LAD.  Remainder of cardiac catheterization was reassuring.  Hospitalization course can be viewed in care everywhere.  I have had a discussion with the patient about admission for troponin trending.  He expresses that he does not desire admission at this time and would rather follow-up outpatient.  I do not believe this is unreasonable.  The nature of his symptoms is quite atypical sounding for ACS.  I have encouraged close outpatient cardiology follow-up with completion of the stress test within the next month.  Referral provided at time of discharge.  Patient reliable for follow-up.  He has been instructed to return for new or concerning symptoms.  Return precautions discussed and provided. Patient discharged in stable condition with  no unaddressed concerns.   Final Clinical Impressions(s) / ED Diagnoses   Final diagnoses:  Nonspecific chest pain    ED Discharge Orders    None       Antony Madura, PA-C 03/28/18 0216    Linwood Dibbles, MD 03/29/18 1030

## 2018-03-27 NOTE — ED Triage Notes (Signed)
Pt from home with c/o left sided cp that moves inward. 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 Pt states that 2 days ago the CP began and has been getting increasingly severe since then. Pt states laying on stomach makes symptoms better.

## 2018-03-28 LAB — I-STAT TROPONIN, ED: Troponin i, poc: 0 ng/mL (ref 0.00–0.08)

## 2018-03-28 NOTE — Discharge Instructions (Signed)
Continue your daily medications.  Your work-up in the emergency department today was reassuring.  We recommend that you follow-up with a cardiologist for further evaluation of your ongoing chest pain.  It would be beneficial to have a stress test completed within the next 30 days.  This can be coordinated by a cardiologist, but may be able to be completed by your primary care doctor.  Return to the ED for new or concerning symptoms.

## 2018-05-05 ENCOUNTER — Other Ambulatory Visit: Payer: Self-pay

## 2018-05-05 ENCOUNTER — Encounter (HOSPITAL_COMMUNITY): Payer: Self-pay | Admitting: Emergency Medicine

## 2018-05-05 ENCOUNTER — Emergency Department (HOSPITAL_COMMUNITY)
Admission: EM | Admit: 2018-05-05 | Discharge: 2018-05-05 | Disposition: A | Discharge status: Home / Self Care | Payer: Self-pay | Attending: Emergency Medicine | Admitting: Emergency Medicine

## 2018-05-05 ENCOUNTER — Emergency Department (HOSPITAL_COMMUNITY): Payer: Self-pay

## 2018-05-05 DIAGNOSIS — I2510 Atherosclerotic heart disease of native coronary artery without angina pectoris: Secondary | ICD-10-CM | POA: Insufficient documentation

## 2018-05-05 DIAGNOSIS — Z79899 Other long term (current) drug therapy: Secondary | ICD-10-CM | POA: Insufficient documentation

## 2018-05-05 DIAGNOSIS — F17210 Nicotine dependence, cigarettes, uncomplicated: Secondary | ICD-10-CM | POA: Insufficient documentation

## 2018-05-05 DIAGNOSIS — J45909 Unspecified asthma, uncomplicated: Secondary | ICD-10-CM | POA: Insufficient documentation

## 2018-05-05 DIAGNOSIS — K649 Unspecified hemorrhoids: Secondary | ICD-10-CM | POA: Insufficient documentation

## 2018-05-05 DIAGNOSIS — Z7901 Long term (current) use of anticoagulants: Secondary | ICD-10-CM | POA: Insufficient documentation

## 2018-05-05 DIAGNOSIS — I10 Essential (primary) hypertension: Secondary | ICD-10-CM | POA: Insufficient documentation

## 2018-05-05 DIAGNOSIS — K625 Hemorrhage of anus and rectum: Secondary | ICD-10-CM

## 2018-05-05 DIAGNOSIS — R079 Chest pain, unspecified: Secondary | ICD-10-CM | POA: Insufficient documentation

## 2018-05-05 LAB — TYPE AND SCREEN
ABO/RH(D): A POS
Antibody Screen: NEGATIVE

## 2018-05-05 LAB — COMPREHENSIVE METABOLIC PANEL
ALK PHOS: 76 U/L (ref 38–126)
ALT: 41 U/L (ref 0–44)
AST: 34 U/L (ref 15–41)
Albumin: 3.6 g/dL (ref 3.5–5.0)
Anion gap: 10 (ref 5–15)
BUN: 6 mg/dL (ref 6–20)
CALCIUM: 8.6 mg/dL — AB (ref 8.9–10.3)
CHLORIDE: 99 mmol/L (ref 98–111)
CO2: 25 mmol/L (ref 22–32)
CREATININE: 1.13 mg/dL (ref 0.61–1.24)
Glucose, Bld: 228 mg/dL — ABNORMAL HIGH (ref 70–99)
Potassium: 3.3 mmol/L — ABNORMAL LOW (ref 3.5–5.1)
SODIUM: 134 mmol/L — AB (ref 135–145)
Total Bilirubin: 0.7 mg/dL (ref 0.3–1.2)
Total Protein: 7.1 g/dL (ref 6.5–8.1)

## 2018-05-05 LAB — ABO/RH: ABO/RH(D): A POS

## 2018-05-05 LAB — CBC
HCT: 44.2 % (ref 39.0–52.0)
Hemoglobin: 14.8 g/dL (ref 13.0–17.0)
MCH: 29.7 pg (ref 26.0–34.0)
MCHC: 33.5 g/dL (ref 30.0–36.0)
MCV: 88.8 fL (ref 78.0–100.0)
PLATELETS: 330 10*3/uL (ref 150–400)
RBC: 4.98 MIL/uL (ref 4.22–5.81)
RDW: 12.4 % (ref 11.5–15.5)
WBC: 8.8 10*3/uL (ref 4.0–10.5)

## 2018-05-05 LAB — I-STAT TROPONIN, ED: Troponin i, poc: 0 ng/mL (ref 0.00–0.08)

## 2018-05-05 LAB — POC OCCULT BLOOD, ED: FECAL OCCULT BLD: POSITIVE — AB

## 2018-05-05 IMAGING — DX DG CHEST 1V PORT
1 series · 1 of 1 positions shown · non-contrast
Comparison: [DATE]

CLINICAL DATA: Chest pain

EXAM:
PORTABLE CHEST 1 VIEW

[chest]
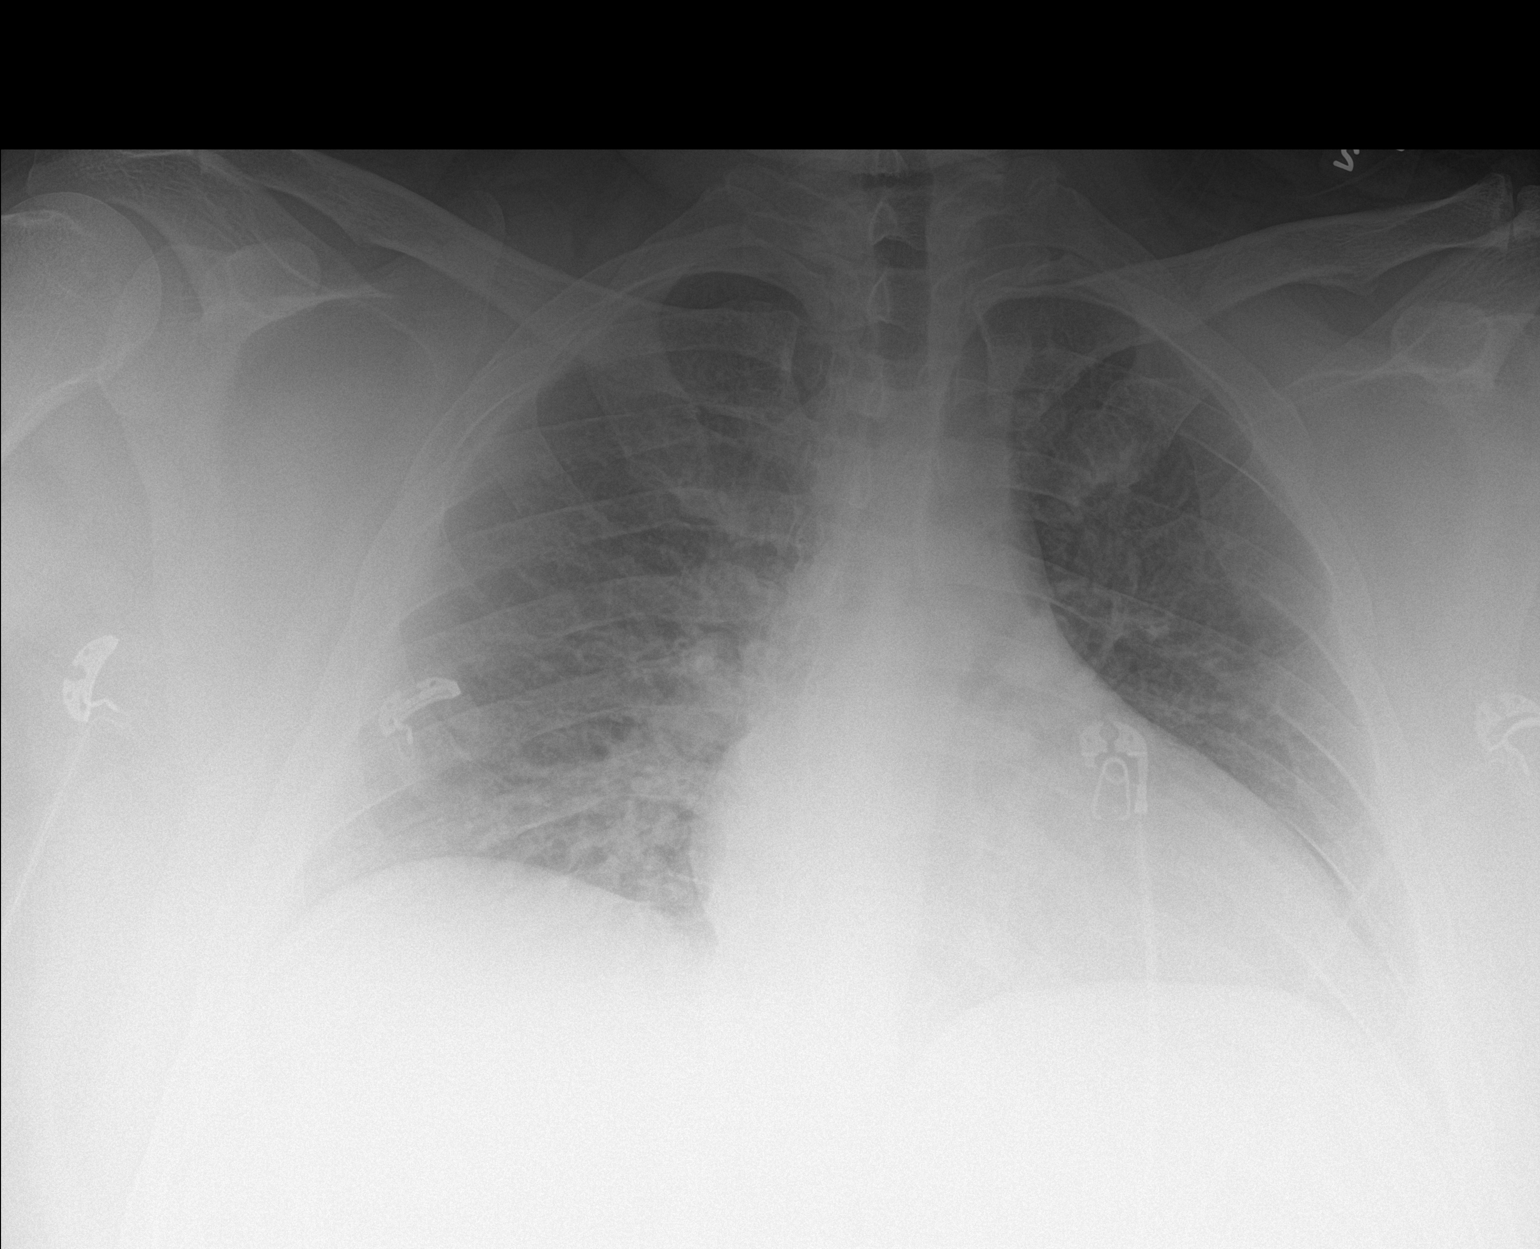

[1 of 1 positions shown; findings below may reference images not displayed]

FINDINGS: Borderline to mild cardiomegaly with mild vascular congestion. No
consolidation or effusion. No pneumothorax.
IMPRESSION: Borderline to mild cardiomegaly with minimal vascular congestion.

## 2018-05-05 MED ORDER — TIZANIDINE HCL 4 MG PO TABS
4.0000 mg | ORAL_TABLET | Freq: Once | ORAL | Status: AC
  Administered 2018-05-05: 4 mg via ORAL
  Filled 2018-05-05: qty 1

## 2018-05-05 MED ORDER — LIDOCAINE HCL URETHRAL/MUCOSAL 2 % EX GEL
1.0000 | Freq: Once | CUTANEOUS | Status: AC
Start: 2018-05-05 — End: 2018-05-05
  Administered 2018-05-05: 1 via TOPICAL
  Filled 2018-05-05: qty 20

## 2018-05-05 MED ORDER — LIDOCAINE HCL 2 % EX GEL: 1.0000 "application " | CUTANEOUS | 0 refills | Status: DC | PRN

## 2018-05-05 MED ORDER — TIZANIDINE HCL 4 MG PO TABS: 4.0000 mg | ORAL_TABLET | Freq: Four times a day (QID) | ORAL | 0 refills | Status: DC | PRN

## 2018-05-05 NOTE — ED Provider Notes (Signed)
[facility] Provider Note   CSN: [id number] Arrival date & time: 05/05/18  1751     History   Chief Complaint Chief Complaint  Patient presents with  . GI Bleeding    HPI Jeffrey CrowJames Rougeau Jr. is a 37 y.o. male who presents with rectal pain and bleeding. PMH significant for CAD, hx of NSTEMI on Plavix, HTN, hx of internal and external hemorrhoids, obestiy. He states that over the past three days he's had gradually worsening rectal pain and bloody bowel movements. The blood is bright red in color. He has hemorrhoid cream and suppositories at home which he has been using without relief. He has had multiple evaluations in the past. Lidocaine jelly and muscle relaxers seemed to help. He has had several bouts of rectal bleeding over the past year which have been minor and manageable with just the cream and suppositories. He reports associated diffuse lower abdominal pain and loose stools. He denies fever, vomiting, constipation.   His wife is at bedside and also brings up chest pain that he had this morning which occurred around 10AM. He states that he was sitting and that it was a pressure like sensation over the center of his chest which felt similar to his MI. It lasted for several seconds and resolved. He does not have this pain often. Of note he was seen in the ED in July for chest pain which was pressure like. He does not have a cardiologist here because he has [place] [facility] and is uninsured. Fortunately, he has been able to establish care with a PCP in the area.  HPI  Past Medical History:  Diagnosis Date  . Arthritis   . Asthma   . Hypertension   . Myocardial infarction [facility](HCC)     Patient Active Problem List   Diagnosis Date Noted  . Snoring 03/13/2018    Past Surgical History:  Procedure Laterality Date  . cardiac stents          Home Medications    Prior to Admission medications     Medication Sig Start Date End Date Taking? Authorizing Provider  amLODipine (NORVASC) 10 MG tablet Take 10 mg by mouth daily.    [provider]  atorvastatin (LIPITOR) 40 MG tablet Take 40 mg by mouth at bedtime.    [provider]  clopidogrel (PLAVIX) 75 MG tablet Take 75 mg by mouth daily.    [provider]  gabapentin (NEURONTIN) 400 MG capsule Take 800 mg by mouth 3 (three) times daily.     [provider]  hydrochlorothiazide (HYDRODIURIL) 25 MG tablet Take 25 mg by mouth daily.    [provider]  hydrOXYzine (VISTARIL) 50 MG capsule Take 50 mg by mouth 3 (three) times daily.    [provider]  metoprolol tartrate (LOPRESSOR) 25 MG tablet Take 25 mg by mouth 2 (two) times daily.    [provider]  naproxen sodium (ALEVE) 220 MG tablet Take 220 mg by mouth 2 (two) times daily as needed (headache).    [provider]  prazosin (MINIPRESS) 1 MG capsule Take 1 mg by mouth at bedtime.    [provider]  traZODone (DESYREL) 100 MG tablet Take 100 mg by mouth at bedtime.    [provider]    Family History No family history on file.  Social History Social History   Tobacco Use  . Smoking status: Current Every Day  Smoker    Types: Cigarettes  . Smokeless tobacco: Never Used  . Tobacco comment: 4 cigarettes   Substance Use Topics  . Alcohol use: Not Currently  . Drug use: Not Currently     Allergies   Penicillins; Asa [aspirin]; and Erythromycin   Review of Systems Review of Systems  Constitutional: Negative for fever.  Respiratory: Negative for shortness of breath.   Cardiovascular: Positive for chest pain (resolved).  Gastrointestinal: Positive for abdominal pain, blood in stool, diarrhea and rectal pain. Negative for anal bleeding, constipation, nausea and vomiting.  Genitourinary: Negative for dysuria.  All other systems reviewed and are negative.    Physical Exam Updated  Vital Signs BP 139/86   Pulse 98   Temp 98.6 F (37 C)   Resp 18   SpO2 98%   Physical Exam  Constitutional: He is oriented to person, place, and time. He appears well-developed and well-nourished. No distress.  Obese, lying on side. Has sunglasses on  HENT:  Head: Normocephalic and atraumatic.  Eyes: Pupils are equal, round, and reactive to light. Conjunctivae are normal. Right eye exhibits no discharge. Left eye exhibits no discharge. No scleral icterus.  Neck: Normal range of motion.  Cardiovascular: Normal rate and regular rhythm.  Pulmonary/Chest: Effort normal and breath sounds normal. No respiratory distress.  Abdominal: Soft. Bowel sounds are normal. He exhibits no distension and no mass. There is tenderness (diffuse lower abdominal tenderness). There is no rebound and no guarding. No hernia.  Genitourinary:  Genitourinary Comments: Rectal: Exquisite tenderness. Small amount of bright red blood with external non-thrombosed hemorrhoid. Pt could not tolerate full digital exam. No obvious fissures, redness, area of fluctuance, lesions. Chaperone present during exam.   Neurological: He is alert and oriented to person, place, and time.  Skin: Skin is warm and dry.  Psychiatric: He has a normal mood and affect. His behavior is normal.  Nursing note and vitals reviewed.    ED Treatments / Results  Labs (all labs ordered are listed, but only abnormal results are displayed) Labs Reviewed  COMPREHENSIVE METABOLIC PANEL - Abnormal; Notable for the following components:      Result Value   Sodium 134 (*)    Potassium 3.3 (*)    Glucose, Bld 228 (*)    Calcium 8.6 (*)    All other components within normal limits  POC OCCULT BLOOD, ED - Abnormal; Notable for the following components:   Fecal Occult Bld POSITIVE (*)    All other components within normal limits  CBC  I-STAT TROPONIN, ED  TYPE AND SCREEN  ABO/RH    EKG EKG Interpretation  Date/Time:  Sunday May 05 2018  21:14:26 EDT Ventricular Rate:  82 PR Interval:    QRS Duration: 114 QT Interval:  384 QTC Calculation: 449 R Axis:   -18 Text Interpretation:  Sinus rhythm Inferolateral infarct, old When compared with ECG of 03/27/2018, No significant change was found Confirmed by Dione BoozeGlick, David ([id number]) on 05/06/2018 4:01:23 PM   Radiology Dg Chest Port 1 View  Result Date: 05/05/2018 CLINICAL DATA:  Chest pain EXAM: PORTABLE CHEST 1 VIEW COMPARISON:  03/27/2018 FINDINGS: Borderline to mild cardiomegaly with mild vascular congestion. No consolidation or effusion. No pneumothorax. IMPRESSION: Borderline to mild cardiomegaly with minimal vascular congestion. Electronically Signed   By: Jasmine PangKim  Fujinaga M.D.   On: 05/05/2018 21:45    Procedures Procedures (including critical care time)  Medications Ordered in ED Medications  lidocaine (XYLOCAINE) 2 % jelly 1 application (1 application  Topical Given 05/05/18 2120)  tiZANidine (ZANAFLEX) tablet 4 mg (4 mg Oral Given 05/05/18 2119)     Initial Impression / Assessment and Plan / ED Course  I have reviewed the triage vital signs and the nursing notes.  Pertinent labs & imaging results that were available during my care of the patient were reviewed by me and considered in my medical decision making (see chart for details).  37 year old male presents with rectal pain and bleeding for several days. His vitals are normal. On exam he is significant rectal tenderness and gross blood on digital exam. CBC is normal. CMP is remarkable for mild hyponatremia, mild hypokalemia, hyperglycemia (228). He is using his prescribed meds without relief. In the past, lidocaine and a muscle relaxer helped. We will give this to him here and rx for the same. His wife is very concerned about hyperglycemia. They are encouraged to f/u with their PCP for recheck  Additionally the patient reports chest pain this morning. This was not mentioned to nursing in triage and was actually brought up  by his wife while on my initial exam. The patient reports a pressure sensation this morning which felt like his MI. He does have this pain at times but not often. He had a recent eval in the ED for this pain. He does not have a cardiologist currently. EKG is SR which appears unchanged. Initial trop is 0. CXR shows borderline cardiomegaly with mild vascular congestion which is similar to prior. Will defer 2nd trop as pain lasted for only seconds and was ~12 hours ago. Strict return precautions given.  Final Clinical Impressions(s) / ED Diagnoses   Final diagnoses:  Hemorrhoids, unspecified hemorrhoid type  Rectal bleeding  Chest pain, unspecified type    ED Discharge Orders    None       Bethel Born, PA-C 05/06/18 Julian Reil    Bethann Berkshire, MD 05/07/18 1137

## 2018-05-05 NOTE — Discharge Instructions (Signed)
Please have blood sugar and A1c checked by your PCP Use Lidocaine jelly and muscle relaxer as needed for rectal pain. Continue hemorrhoid cream and suppositories as well Return if worsening

## 2018-05-05 NOTE — ED Triage Notes (Signed)
Pt states almost 3 days of blood in stool. Does have a known hemorrhoid. Pt states there is a lot of blood in the toilet, states it was dark the first day but is bright now. Takes Plavix, denies excessive use of NSAIDS.

## 2018-05-30 ENCOUNTER — Ambulatory Visit: Payer: Self-pay | Admitting: Cardiovascular Disease

## 2018-06-18 NOTE — Progress Notes (Deleted)
Elmer Picker MD Reason for referral-Chest pain  HPI: 37 yo male for evaluation of CP at request of Linwood Dibbles MD. Seen 7/19 and 8/19 with CP; chest xray with borderline CE and mild vascular congestion. Hgb 14.8, troponin neg, glucose 228, K 3.3, LFTs normal. Pt with h/o CAD; previously follow [facility]. Had abnormal functional study 6/18. BMS x 2 to RCA 6/18. Echo 6/18 showed normal LV function. ABIs 7/18 normal.   Current Outpatient Medications  Medication Sig Dispense Refill  . albuterol (PROVENTIL HFA;VENTOLIN HFA) 108 (90 Base) MCG/ACT inhaler Inhale 2 puffs into the lungs every 6 (six) hours as needed for wheezing or shortness of breath.    Marland Kitchen amLODipine (NORVASC) 10 MG tablet Take 10 mg by mouth daily.    Marland Kitchen atorvastatin (LIPITOR) 40 MG tablet Take 40 mg by mouth at bedtime.    Marland Kitchen buPROPion (WELLBUTRIN) 100 MG tablet Take 100 mg by mouth See admin instructions. Take 1 tablet (100 mg) by mouth twice daily - morning and mid afternoon    . clopidogrel (PLAVIX) 75 MG tablet Take 75 mg by mouth daily.    . furosemide (LASIX) 20 MG tablet Take 20 mg by mouth daily as needed (feet and leg swelling).    . gabapentin (NEURONTIN) 400 MG capsule Take 800 mg by mouth 3 (three) times daily.     . hydrochlorothiazide (HYDRODIURIL) 25 MG tablet Take 25 mg by mouth daily.    . hydrOXYzine (VISTARIL) 50 MG capsule Take 100 mg by mouth 3 (three) times daily.     Marland Kitchen ketoconazole (NIZORAL) 2 % cream Apply 1 application topically 2 (two) times daily. Apply to skin folds on abdomen    . lidocaine (XYLOCAINE) 2 % jelly Apply 1 application topically as needed. 30 mL 0  . metoprolol tartrate (LOPRESSOR) 25 MG tablet Take 12.5 mg by mouth 2 (two) times daily.     . Multiple Vitamin (MULTIVITAMIN WITH MINERALS) TABS tablet Take 1 tablet by mouth daily.    . prazosin (MINIPRESS) 2 MG capsule Take 2 mg by mouth at bedtime.     Marland Kitchen tiZANidine (ZANAFLEX) 4 MG tablet Take 1 tablet (4 mg total) by mouth every 6  (six) hours as needed for muscle spasms. 30 tablet 0  . traZODone (DESYREL) 100 MG tablet Take 150 mg by mouth at bedtime.      No current facility-administered medications for this visit.     Allergies  Allergen Reactions  . Asa [Aspirin] Anaphylaxis  . Penicillins Anaphylaxis and Rash    Has patient had a PCN reaction causing immediate rash, facial/tongue/throat swelling, SOB or lightheadedness with hypotension: Yes Has patient had a PCN reaction causing severe rash involving mucus membranes or skin necrosis: No Has patient had a PCN reaction that required hospitalization: reaction occurred in the hospital Has patient had a PCN reaction occurring within the last 10 years: Yes If all of the above answers are "NO", then may proceed with Cephalosporin use.  Marland Kitchen Lisinopril     Kidney failure  . Clindamycin Rash  . Erythromycin Rash    Past Medical History:  Diagnosis Date  . Arthritis   . Asthma   . Hypertension   . Myocardial infarction [facility])     Past Surgical History:  Procedure Laterality Date  . cardiac stents      Social History   Socioeconomic History  . Marital status: Married    Spouse name: Not on file  . Number of children: Not  on file  . Years of education: Not on file  . Highest education level: Not on file  Occupational History  . Not on file  Social Needs  . Financial resource strain: Not on file  . Food insecurity:    Worry: Not on file    Inability: Not on file  . Transportation needs:    Medical: Not on file    Non-medical: Not on file  Tobacco Use  . Smoking status: Current Every Day Smoker    Types: Cigarettes  . Smokeless tobacco: Never Used  . Tobacco comment: 4 cigarettes   Substance and Sexual Activity  . Alcohol use: Not Currently  . Drug use: Not Currently  . Sexual activity: Not on file  Lifestyle  . Physical activity:    Days per week: Not on file    Minutes per session: Not on file  . Stress: Not on file  Relationships  .  Social connections:    Talks on phone: Not on file    Gets together: Not on file    Attends religious service: Not on file    Active member of club or organization: Not on file    Attends meetings of clubs or organizations: Not on file    Relationship status: Not on file  . Intimate partner violence:    Fear of current or ex partner: Not on file    Emotionally abused: Not on file    Physically abused: Not on file    Forced sexual activity: Not on file  Other Topics Concern  . Not on file  Social History Narrative  . Not on file    No family history on file.  ROS: no fevers or chills, productive cough, hemoptysis, dysphasia, odynophagia, melena, hematochezia, dysuria, hematuria, rash, seizure activity, orthopnea, PND, pedal edema, claudication. Remaining systems are negative.  Physical Exam:   There were no vitals taken for this visit.  General:  Well developed/well nourished in NAD Skin warm/dry Patient not depressed No peripheral clubbing Back-normal HEENT-normal/normal eyelids Neck supple/normal carotid upstroke bilaterally; no bruits; no JVD; no thyromegaly chest - CTA/ normal expansion CV - RRR/normal S1 and S2; no murmurs, rubs or gallops;  PMI nondisplaced Abdomen -NT/ND, no HSM, no mass, + bowel sounds, no bruit 2+ femoral pulses, no bruits Ext-no edema, chords, 2+ DP Neuro-grossly nonfocal  ECG - 03/27/18, NSR inferior MI; personally reviewed  A/P  1  Olga Millers, MD

## 2018-06-24 ENCOUNTER — Ambulatory Visit: Payer: Self-pay | Admitting: Cardiology

## 2018-06-28 ENCOUNTER — Ambulatory Visit: Payer: Self-pay | Attending: Nurse Practitioner | Admitting: Nurse Practitioner

## 2018-06-28 ENCOUNTER — Encounter: Payer: Self-pay | Admitting: Nurse Practitioner

## 2018-06-28 VITALS — BP 146/91 | HR 83 | Temp 98.2°F | Ht 75.0 in | Wt 379.4 lb

## 2018-06-28 DIAGNOSIS — Z7902 Long term (current) use of antithrombotics/antiplatelets: Secondary | ICD-10-CM | POA: Insufficient documentation

## 2018-06-28 DIAGNOSIS — F419 Anxiety disorder, unspecified: Secondary | ICD-10-CM | POA: Insufficient documentation

## 2018-06-28 DIAGNOSIS — F32A Depression, unspecified: Secondary | ICD-10-CM

## 2018-06-28 DIAGNOSIS — E7800 Pure hypercholesterolemia, unspecified: Secondary | ICD-10-CM | POA: Insufficient documentation

## 2018-06-28 DIAGNOSIS — R600 Localized edema: Secondary | ICD-10-CM | POA: Insufficient documentation

## 2018-06-28 DIAGNOSIS — M1990 Unspecified osteoarthritis, unspecified site: Secondary | ICD-10-CM | POA: Insufficient documentation

## 2018-06-28 DIAGNOSIS — I10 Essential (primary) hypertension: Secondary | ICD-10-CM | POA: Insufficient documentation

## 2018-06-28 DIAGNOSIS — F329 Major depressive disorder, single episode, unspecified: Secondary | ICD-10-CM | POA: Insufficient documentation

## 2018-06-28 DIAGNOSIS — Z8249 Family history of ischemic heart disease and other diseases of the circulatory system: Secondary | ICD-10-CM | POA: Insufficient documentation

## 2018-06-28 DIAGNOSIS — R609 Edema, unspecified: Secondary | ICD-10-CM

## 2018-06-28 DIAGNOSIS — Z955 Presence of coronary angioplasty implant and graft: Secondary | ICD-10-CM | POA: Insufficient documentation

## 2018-06-28 DIAGNOSIS — G629 Polyneuropathy, unspecified: Secondary | ICD-10-CM | POA: Insufficient documentation

## 2018-06-28 DIAGNOSIS — F4310 Post-traumatic stress disorder, unspecified: Secondary | ICD-10-CM | POA: Insufficient documentation

## 2018-06-28 DIAGNOSIS — I252 Old myocardial infarction: Secondary | ICD-10-CM | POA: Insufficient documentation

## 2018-06-28 DIAGNOSIS — Z79899 Other long term (current) drug therapy: Secondary | ICD-10-CM | POA: Insufficient documentation

## 2018-06-28 DIAGNOSIS — F5101 Primary insomnia: Secondary | ICD-10-CM

## 2018-06-28 DIAGNOSIS — J45909 Unspecified asthma, uncomplicated: Secondary | ICD-10-CM | POA: Insufficient documentation

## 2018-06-28 DIAGNOSIS — I2510 Atherosclerotic heart disease of native coronary artery without angina pectoris: Secondary | ICD-10-CM | POA: Insufficient documentation

## 2018-06-28 MED ORDER — AMLODIPINE BESYLATE 10 MG PO TABS: 10.0000 mg | ORAL_TABLET | Freq: Every day | ORAL | 1 refills | Status: DC

## 2018-06-28 MED ORDER — BUPROPION HCL ER (SR) 150 MG PO TB12: 150.0000 mg | ORAL_TABLET | Freq: Two times a day (BID) | ORAL | 3 refills | Status: DC

## 2018-06-28 MED ORDER — TRAZODONE HCL 150 MG PO TABS: 150.0000 mg | ORAL_TABLET | Freq: Every day | ORAL | 2 refills | Status: DC

## 2018-06-28 MED ORDER — GABAPENTIN 400 MG PO CAPS: 800.0000 mg | ORAL_CAPSULE | Freq: Three times a day (TID) | ORAL | 3 refills | Status: DC

## 2018-06-28 MED ORDER — TIZANIDINE HCL 4 MG PO TABS: 4.0000 mg | ORAL_TABLET | Freq: Four times a day (QID) | ORAL | 1 refills | Status: DC | PRN

## 2018-06-28 MED ORDER — METOPROLOL TARTRATE 25 MG PO TABS: 12.5000 mg | ORAL_TABLET | Freq: Two times a day (BID) | ORAL | 3 refills | Status: DC

## 2018-06-28 MED ORDER — FUROSEMIDE 20 MG PO TABS: 20.0000 mg | ORAL_TABLET | Freq: Every day | ORAL | 2 refills | Status: DC | PRN

## 2018-06-28 MED ORDER — ATORVASTATIN CALCIUM 40 MG PO TABS: 40.0000 mg | ORAL_TABLET | Freq: Every day | ORAL | 1 refills | Status: DC

## 2018-06-28 MED ORDER — HYDROXYZINE PAMOATE 50 MG PO CAPS: 100.0000 mg | ORAL_CAPSULE | Freq: Three times a day (TID) | ORAL | 3 refills | Status: AC

## 2018-06-28 MED ORDER — LOSARTAN POTASSIUM-HCTZ 100-25 MG PO TABS: 1.0000 | ORAL_TABLET | Freq: Every day | ORAL | 3 refills | Status: DC

## 2018-06-28 MED ORDER — CLOPIDOGREL BISULFATE 75 MG PO TABS: 75.0000 mg | ORAL_TABLET | Freq: Every day | ORAL | 2 refills | Status: DC

## 2018-06-28 NOTE — Progress Notes (Signed)
Assessment & Plan:  Jeffrey Barrera was seen today for hospitalization follow-up.  Diagnoses and all orders for this visit:  Essential hypertension -     losartan-hydrochlorothiazide (HYZAAR) 100-25 MG tablet; Take 1 tablet by mouth daily. -     amLODipine (NORVASC) 10 MG tablet; Take 1 tablet (10 mg total) by mouth daily. -     CMP14+EGFR Continue all antihypertensives as prescribed.  Remember to bring in your blood pressure log with you for your follow up appointment.  DASH/Mediterranean Diets are healthier choices for HTN.   Pure hypercholesterolemia -     atorvastatin (LIPITOR) 40 MG tablet; Take 1 tablet (40 mg total) by mouth at bedtime. -     Lipid panel INSTRUCTIONS: Work on a low fat, heart healthy diet and participate in regular aerobic exercise program by working out at least 150 minutes per week; 5 days a week-30 minutes per day. Avoid red meat, fried foods. junk foods, sodas, sugary drinks, unhealthy snacking, alcohol and smoking.  Drink at least 48oz of water per day and monitor your carbohydrate intake daily.    Primary insomnia -     traZODone (DESYREL) 150 MG tablet; Take 1 tablet (150 mg total) by mouth at bedtime.  Anxiety and depression -     hydrOXYzine (VISTARIL) 50 MG capsule; Take 2 capsules (100 mg total) by mouth 3 (three) times daily. -     buPROPion (WELLBUTRIN SR) 150 MG 12 hr tablet; Take 1 tablet (150 mg total) by mouth 2 (two) times daily.  Peripheral edema -     furosemide (LASIX) 20 MG tablet; Take 1 tablet (20 mg total) by mouth daily as needed (feet and leg swelling).  History of MI (myocardial infarction) -     clopidogrel (PLAVIX) 75 MG tablet; Take 1 tablet (75 mg total) by mouth daily. -     metoprolol tartrate (LOPRESSOR) 25 MG tablet; Take 0.5 tablets (12.5 mg total) by mouth 2 (two) times daily.  Peripheral polyneuropathy -     gabapentin (NEURONTIN) 400 MG capsule; Take 2 capsules (800 mg total) by mouth 3 (three) times daily.  Other orders -      tiZANidine (ZANAFLEX) 4 MG tablet; Take 1 tablet (4 mg total) by mouth every 6 (six) hours as needed for muscle spasms.    Patient has been counseled on age-appropriate routine health concerns for screening and prevention. These are reviewed and up-to-date. Referrals have been placed accordingly. Immunizations are up-to-date or declined.    Subjective:   Chief Complaint  Patient presents with  . Hospitalization Follow-up    Pt. is here for a hospital follow-up.    HPI   Jeffrey Barrera. 37 y.o. male presents to office today to establish care. He relocated from [place] last year due to the [facility] and has a history of MI, HTN, HPL, neuropathy and BLE edema. He will need to establish care with a cardiologist once he has established financial assistance.  He was recently seen in the ED for hemorrhoids which he reports have currently resolved. As his fecal occult blood test was also positive he will need to see GI. He is currently seeing a psychiatrist for anxiety, insomnia, PTSD and depression through family services. He is accompanied by his wife today who reports he will also need to be evaluated by chronic pain management.   CHRONIC HYPERTENSION Disease Monitoring  Blood pressure range. Elevated today. He endorses high readings at home. Will add losartan to HCTZ 25 mg today. He  also takes amlodipine 10 mg and due to history of MI takes metoprolol 12.5 mg bid.  BP Readings from Last 3 Encounters:  06/28/18 (!) 146/91  05/05/18 132/77  03/27/18 136/90   Chest pain: no   Dyspnea: no   Claudication: no  Medication compliance: yes  Medication Side Effects  Lightheadedness: no   Urinary frequency: no   Edema: yes   Impotence: no  Preventitive Healthcare:  Exercise: no   Diet Pattern: diet: general  Salt Restriction:  No  Hyperlipidemia Patient presents for follow up to hyperlipidemia.  He is medication compliant taking atorvastatin 40 mg daily. He is not diet compliant and  denies exertional chest pressure/discomfort, poor exercise tolerance and skin xanthelasma or statin intolerance including myalgias.  Lab Results  Component Value Date   CHOL 178 06/28/2018   Lab Results  Component Value Date   HDL 31 (L) 06/28/2018   Lab Results  Component Value Date   LDLCALC 105 (H) 06/28/2018   Lab Results  Component Value Date   TRIG 209 (H) 06/28/2018   Lab Results  Component Value Date   CHOLHDL 5.7 (H) 06/28/2018    Review of Systems  Constitutional: Negative for fever, malaise/fatigue and weight loss.  HENT: Negative.  Negative for nosebleeds.   Eyes: Negative.  Negative for blurred vision, double vision and photophobia.  Respiratory: Negative.  Negative for cough and shortness of breath.   Cardiovascular: Positive for leg swelling. Negative for chest pain and palpitations.  Gastrointestinal: Negative.  Negative for heartburn, nausea and vomiting.  Musculoskeletal: Positive for joint pain. Negative for myalgias.  Neurological: Positive for tingling and sensory change. Negative for dizziness, focal weakness, seizures and headaches.  Psychiatric/Behavioral: Positive for depression. Negative for suicidal ideas. The patient is nervous/anxious and has insomnia.     Past Medical History:  Diagnosis Date  . Arthritis   . Asthma   . Claudication ([place]) 04/02/2017  . Coronary artery disease involving native coronary artery of native heart without angina pectoris 04/02/2017  . Essential hypertension 03/07/2017  . Glucose intolerance (impaired glucose tolerance) 03/15/2017  . Hypertension   . Myocardial infarction ([place])   . Paresthesia of lower extremity 04/02/2017  . Pure hypercholesterolemia 03/07/2017  . Snoring 03/13/2018  . Tobacco abuse 03/07/2017    Past Surgical History:  Procedure Laterality Date  . cardiac stents      Family History  Problem Relation Age of Onset  . Hypertension Mother     Social History Reviewed with no changes to be made  today.   Outpatient Medications Prior to Visit  Medication Sig Dispense Refill  . albuterol (PROVENTIL HFA;VENTOLIN HFA) 108 (90 Base) MCG/ACT inhaler Inhale 2 puffs into the lungs every 6 (six) hours as needed for wheezing or shortness of breath.    . ketoconazole (NIZORAL) 2 % cream Apply 1 application topically 2 (two) times daily. Apply to skin folds on abdomen    . lidocaine (XYLOCAINE) 2 % jelly Apply 1 application topically as needed. 30 mL 0  . Multiple Vitamin (MULTIVITAMIN WITH MINERALS) TABS tablet Take 1 tablet by mouth daily.    Marland Kitchen amLODipine (NORVASC) 10 MG tablet Take 10 mg by mouth daily.    Marland Kitchen atorvastatin (LIPITOR) 40 MG tablet Take 40 mg by mouth at bedtime.    Marland Kitchen buPROPion (WELLBUTRIN) 100 MG tablet Take 100 mg by mouth See admin instructions. Take 1 tablet (100 mg) by mouth twice daily - morning and mid afternoon    . clopidogrel (PLAVIX)  75 MG tablet Take 75 mg by mouth daily.    . furosemide (LASIX) 20 MG tablet Take 20 mg by mouth daily as needed (feet and leg swelling).    . gabapentin (NEURONTIN) 400 MG capsule Take 800 mg by mouth 3 (three) times daily.     . hydrochlorothiazide (HYDRODIURIL) 25 MG tablet Take 25 mg by mouth daily.    . hydrOXYzine (VISTARIL) 50 MG capsule Take 100 mg by mouth 3 (three) times daily.     . metoprolol tartrate (LOPRESSOR) 25 MG tablet Take 12.5 mg by mouth 2 (two) times daily.     . prazosin (MINIPRESS) 2 MG capsule Take 2 mg by mouth at bedtime.     Marland Kitchen tiZANidine (ZANAFLEX) 4 MG tablet Take 1 tablet (4 mg total) by mouth every 6 (six) hours as needed for muscle spasms. 30 tablet 0  . traZODone (DESYREL) 100 MG tablet Take 150 mg by mouth at bedtime.      No facility-administered medications prior to visit.     Allergies  Allergen Reactions  . Asa [Aspirin] Anaphylaxis  . Penicillins Anaphylaxis and Rash    Has patient had a PCN reaction causing immediate rash, facial/tongue/throat swelling, SOB or lightheadedness with hypotension:  Yes Has patient had a PCN reaction causing severe rash involving mucus membranes or skin necrosis: No Has patient had a PCN reaction that required hospitalization: reaction occurred in the hospital Has patient had a PCN reaction occurring within the last 10 years: Yes If all of the above answers are "NO", then may proceed with Cephalosporin use.  [place] Flavor     Throat swell up.   . Lisinopril     Kidney failure  . Other Swelling    Pickle  . Clindamycin Rash  . Erythromycin Rash       Objective:    BP (!) 146/91 (BP Location: Right Arm, Patient Position: Sitting, Cuff Size: Large)   Pulse 83   Temp 98.2 F (36.8 C) (Oral)   Ht '6\' 3"'  (1.905 m)   Wt (!) 379 lb 6.4 oz (172.1 kg)   SpO2 95%   BMI 47.42 kg/m  Wt Readings from Last 3 Encounters:  06/28/18 (!) 379 lb 6.4 oz (172.1 kg)  03/27/18 (!) 364 lb (165.1 kg)  03/13/18 (!) 364 lb (165.1 kg)    Physical Exam  Constitutional: He is oriented to person, place, and time. He appears well-developed and well-nourished. He is cooperative.  HENT:  Head: Normocephalic and atraumatic.  Eyes: EOM are normal.  Neck: Normal range of motion.  Cardiovascular: Normal rate, regular rhythm and normal heart sounds. Exam reveals no gallop and no friction rub.  No murmur heard. Pulmonary/Chest: Effort normal and breath sounds normal. No tachypnea. No respiratory distress. He has no decreased breath sounds. He has no wheezes. He has no rhonchi. He has no rales. He exhibits no tenderness.  Abdominal: Bowel sounds are normal.  Musculoskeletal: Normal range of motion. He exhibits no edema.  Neurological: He is alert and oriented to person, place, and time. Coordination normal.  Skin: Skin is warm and dry.  Psychiatric: He has a normal mood and affect. His behavior is normal. Judgment and thought content normal.  Nursing note and vitals reviewed.      Patient has been counseled extensively about nutrition and exercise as well as the  importance of adherence with medications and regular follow-up. The patient was given clear instructions to go to ER or return to medical center if  symptoms don't improve, worsen or new problems develop. The patient verbalized understanding.   Follow-up: Return in about 4 weeks (around 07/26/2018) for BP recheck with luke.   Gildardo Pounds, FNP-BC [facility] [place], [place]   06/30/2018, 10:34 PM

## 2018-06-29 LAB — CMP14+EGFR
A/G RATIO: 1.5 (ref 1.2–2.2)
ALBUMIN: 4.2 g/dL (ref 3.5–5.5)
ALT: 49 IU/L — AB (ref 0–44)
AST: 31 IU/L (ref 0–40)
Alkaline Phosphatase: 89 IU/L (ref 39–117)
BILIRUBIN TOTAL: 0.3 mg/dL (ref 0.0–1.2)
BUN / CREAT RATIO: 11 (ref 9–20)
BUN: 10 mg/dL (ref 6–20)
CHLORIDE: 97 mmol/L (ref 96–106)
CO2: 26 mmol/L (ref 20–29)
Calcium: 9.8 mg/dL (ref 8.7–10.2)
Creatinine, Ser: 0.9 mg/dL (ref 0.76–1.27)
GFR calc non Af Amer: 107 mL/min/{1.73_m2} (ref >59)
GFR, EST AFRICAN AMERICAN: 124 mL/min/{1.73_m2} (ref >59)
Globulin, Total: 2.8 g/dL (ref 1.5–4.5)
Glucose: 269 mg/dL — ABNORMAL HIGH (ref 65–99)
POTASSIUM: 4.1 mmol/L (ref 3.5–5.2)
Sodium: 137 mmol/L (ref 134–144)
TOTAL PROTEIN: 7 g/dL (ref 6.0–8.5)

## 2018-06-29 LAB — LIPID PANEL
Chol/HDL Ratio: 5.7 ratio — ABNORMAL HIGH (ref 0.0–5.0)
Cholesterol, Total: 178 mg/dL (ref 100–199)
HDL: 31 mg/dL — ABNORMAL LOW (ref >39)
LDL Calculated: 105 mg/dL — ABNORMAL HIGH (ref 0–99)
Triglycerides: 209 mg/dL — ABNORMAL HIGH (ref 0–149)
VLDL Cholesterol Cal: 42 mg/dL — ABNORMAL HIGH (ref 5–40)

## 2018-06-30 ENCOUNTER — Encounter: Payer: Self-pay | Admitting: Nurse Practitioner

## 2018-07-03 ENCOUNTER — Telehealth: Payer: Self-pay

## 2018-07-03 NOTE — Telephone Encounter (Signed)
-----   Message from Claiborne Rigg, NP sent at 06/30/2018 11:00 PM EDT ----- Your glucose levels are showing numbers consistent with diabetes. We will discuss at your next office visit. I have sent a diabetes medication for you to start taking to the pharmacy and would like for you to start checking your blood glucose levels twice a day. Please bring your meter in for your next appointment. Before you eat your blood glucose levels should be between 90-130 and 1-2 hours after you eat your blood glucose levels should be <180. Your cholesterol levels are also elevated; please make sure you are taking your cholesterol medication atorvastatin every day as prescribed. High cholesterol levels increase your risk of another heart attack or stroke.

## 2018-07-03 NOTE — H&P (View-Only) (Signed)
Cardiology Office Note:    Date:  07/04/2018   ID:  Jeffrey Adger Jr., DOB 05/28/1981, MRN [id number]  PCP:  Jeffrey Barrera  Cardiologist:  Jeffrey Munley, MD   Referring MD: Jeffrey Barrera  ASSESSMENT:    1. Coronary artery disease involving native coronary artery of native heart without angina pectoris   2. Essential hypertension   3. Pure hypercholesterolemia   4. Claudication in peripheral vascular disease (HCC)   5. Does not have health insurance   6. Pre-op evaluation    PLAN:    In order of problems listed above:  1. See discussion under history known CAD PCI and 2 bare-metal stents June 2018 now having class III angina he will continue medical therapy and referred for left heart catheterization and PCI if appropriate.  Note he had anaphylaxis with aspirin.  He has been maintained on single agent clopidogrel.  Risk benefits options detailed with the patient and wife they voiced understanding and agreed to heart catheterization 2. Stable blood pressure target continue current treatment 3. Stable continue high intensity statin 4. He has a diagnosis of claudication peripheral vascular disease, however ABIs were normal he has low back pain and neuropathic symptoms and I think he is pseudo-claudication. 5. Referred for social work consultation prior to elective coronary angiography  Next appointment 1 month   Medication Adjustments/Labs and Tests Ordered: Current medicines are reviewed at length with the patient today.  Concerns regarding medicines are outlined above.  Orders Placed This Encounter  Procedures  . DG Chest 2 View  . CBC  . Basic Metabolic Panel (BMET)  . Ambulatory referral to Social Work  . EKG 12-Lead   Meds ordered this encounter  Medications  . nitroGLYCERIN (NITROSTAT) 0.4 MG SL tablet    Sig: Place 1 tablet (0.4 mg total) under the tongue every 5 (five) minutes as needed for chest pain.    Dispense:  25 tablet    Refill:  11      Chief Complaint  Patient presents with  . Coronary Artery Disease    History of Present Illness:    Jeffrey Findlay Jr. is a 37 y.o. male who is being seen today for the evaluation of CAD with PCI and stent of LAD 03/01/17 at the request of Jeffrey Barrera.  Has been under intense personal stress with the abdomen out of her teens and being relocated and financial distress.  Overall feels poorly he is now on CPAP for his obstructive sleep apnea he has marked exercise intolerance weakness and fatigue but he has a pattern of anginal discomfort diffuse precordial pressure without radiation with shortness of breath with and without physical activity relieved and unrelieved with rest and ice several episodes of week and the pattern is accelerated.  He has had an emergency room visit in July with chest pain normal troponins.  Reviewed the patient's history and drug-eluting stents and his wife thinks is because of her questions of his financial ability to afford long-term dual antiplatelet therapy with newer agents he is also allergic to aspirin with anaphylaxis.  He has been maintained on clopidogrel.  In view of his clinical presentation and high degree of suspicion of in-stent restenosis with 2 drug-eluting stents I advocated he undergo coronary angiography options benefits and risk detail the patient and wife agree will be scheduled for week at the request of social work work consult for financial assistance.  In the interim we will continue his current medical   treatment and is been given a new prescription for nitroglycerin. Past Medical History:  Diagnosis Date  . Arthritis   . Asthma   . Claudication ([place]) 04/02/2017  . Coronary artery disease involving native coronary artery of native heart without angina pectoris 04/02/2017  . Essential hypertension 03/07/2017  . Glucose intolerance (impaired glucose tolerance) 03/15/2017  . Hypertension   . Myocardial infarction ([place])   . Paresthesia of  lower extremity 04/02/2017  . Pure hypercholesterolemia 03/07/2017  . Snoring 03/13/2018  . Tobacco abuse 03/07/2017    Past Surgical History:  Procedure Laterality Date  . CARDIAC CATHETERIZATION    . cardiac stents    . CORONARY ANGIOPLASTY      Current Medications: Current Meds  Medication Sig  . albuterol (PROVENTIL HFA;VENTOLIN HFA) 108 (90 Base) MCG/ACT inhaler Inhale 2 puffs into the lungs every 6 (six) hours as needed for wheezing or shortness of breath.  Marland Kitchen amLODipine (NORVASC) 10 MG tablet Take 1 tablet (10 mg total) by mouth daily.  Marland Kitchen atorvastatin (LIPITOR) 40 MG tablet Take 1 tablet (40 mg total) by mouth at bedtime.  . Blood Glucose Monitoring Suppl (TRUE METRIX METER) w/Device KIT Use as instructed  . buPROPion (WELLBUTRIN SR) 150 MG 12 hr tablet Take 1 tablet (150 mg total) by mouth 2 (two) times daily.  . clopidogrel (PLAVIX) 75 MG tablet Take 1 tablet (75 mg total) by mouth daily.  . furosemide (LASIX) 20 MG tablet Take 1 tablet (20 mg total) by mouth daily as needed (feet and leg swelling).  . gabapentin (NEURONTIN) 400 MG capsule Take 2 capsules (800 mg total) by mouth 3 (three) times daily.  Marland Kitchen glucose blood (TRUE METRIX BLOOD GLUCOSE TEST) test strip Use as instructed  . hydrOXYzine (VISTARIL) 50 MG capsule Take 2 capsules (100 mg total) by mouth 3 (three) times daily.  Marland Kitchen ketoconazole (NIZORAL) 2 % cream Apply 1 application topically 2 (two) times daily. Apply to skin folds on abdomen  . lidocaine (XYLOCAINE) 2 % jelly Apply 1 application topically as needed.  Marland Kitchen losartan-hydrochlorothiazide (HYZAAR) 100-25 MG tablet Take 1 tablet by mouth daily.  . metoprolol tartrate (LOPRESSOR) 25 MG tablet Take 0.5 tablets (12.5 mg total) by mouth 2 (two) times daily.  . Multiple Vitamin (MULTIVITAMIN WITH MINERALS) TABS tablet Take 1 tablet by mouth daily.  Marland Kitchen tiZANidine (ZANAFLEX) 4 MG tablet Take 1 tablet (4 mg total) by mouth every 6 (six) hours as needed for muscle spasms.  .  traZODone (DESYREL) 150 MG tablet Take 1 tablet (150 mg total) by mouth at bedtime.  . TRUEPLUS LANCETS 28G MISC Use as instructed     Allergies:   Asa [aspirin]; Penicillins; Lemon flavor; Lisinopril; Other; Clindamycin; and Erythromycin   Social History   Socioeconomic History  . Marital status: Married    Spouse name: Not on file  . Number of children: Not on file  . Years of education: Not on file  . Highest education level: Not on file  Occupational History  . Not on file  Social Needs  . Financial resource strain: Not on file  . Food insecurity:    Worry: Not on file    Inability: Not on file  . Transportation needs:    Medical: Not on file    Non-medical: Not on file  Tobacco Use  . Smoking status: Current Every Day Smoker    Types: Cigarettes  . Smokeless tobacco: Former Systems developer    Types: Chew    Quit date: 2000  .  Tobacco comment: 4 cigarettes   Substance and Sexual Activity  . Alcohol use: Not Currently  . Drug use: Not Currently  . Sexual activity: Yes  Lifestyle  . Physical activity:    Days per week: Not on file    Minutes per session: Not on file  . Stress: Not on file  Relationships  . Social connections:    Talks on phone: Not on file    Gets together: Not on file    Attends religious service: Not on file    Active member of club or organization: Not on file    Attends meetings of clubs or organizations: Not on file    Relationship status: Not on file  Other Topics Concern  . Not on file  Social History Narrative  . Not on file     Family History: The patient's family history includes Heart attack in his mother; Heart disease in his mother; Hypertension in his father and mother.  ROS:   Review of Systems  Constitution: Positive for chills and malaise/fatigue.  HENT: Negative.   Eyes: Positive for visual disturbance.  Cardiovascular: Positive for chest pain and dyspnea on exertion.  Respiratory: Positive for shortness of breath and snoring.    Endocrine: Negative.   Hematologic/Lymphatic: Negative.   Skin: Negative.   Musculoskeletal: Positive for back pain.  Gastrointestinal: Negative.   Genitourinary: Negative.   Neurological: Positive for dizziness.  Psychiatric/Behavioral: Positive for depression. The patient is nervous/anxious.   Allergic/Immunologic: Negative.    Please see the history of present illness.     All other systems reviewed and are negative.  EKGs/Labs/Other Studies Reviewed:    The following studies were reviewed today:   EKG:  EKG is  ordered today.  The ekg ordered today demonstrates sinus rhythm left axis deviation  Recent Labs: 05/05/2018: Hemoglobin 14.8; Platelets 330 06/28/2018: ALT 49; BUN 10; Creatinine, Ser 0.90; Potassium 4.1; Sodium 137  Recent Lipid Panel    Component Value Date/Time   CHOL 178 06/28/2018 1213   TRIG 209 (H) 06/28/2018 1213   HDL 31 (L) 06/28/2018 1213   CHOLHDL 5.7 (H) 06/28/2018 1213   LDLCALC 105 (H) 06/28/2018 1213    Physical Exam:    VS:  BP 110/72 (BP Location: Left Arm, Patient Position: Sitting, Cuff Size: Large)   Pulse 72   Ht _0  (1.905 m)   Wt (!) 380 lb 12.8 oz (172.7 kg)   SpO2 97%   BMI 47.60 kg/m     Wt Readings from Last 3 Encounters:  07/04/18 (!) 380 lb 12.8 oz (172.7 kg)  06/28/18 (!) 379 lb 6.4 oz (172.1 kg)  03/27/18 (!) 364 lb (165.1 kg)     GEN:  Well nourished, well developed in no acute distress marked obesity HEENT: Normal NECK: No JVD; No carotid bruits LYMPHATICS: No lymphadenopathy CARDIAC: RRR, no murmurs, rubs, gallops RESPIRATORY:  Clear to auscultation without rales, wheezing or rhonchi  ABDOMEN: Soft, non-tender, non-distended MUSCULOSKELETAL:  No edema; No deformity  SKIN: Warm and dry NEUROLOGIC:  Alert and oriented x 3 PSYCHIATRIC:  Normal affect     Signed, Shirlee More, MD  07/04/2018 10:40 AM    [place]

## 2018-07-03 NOTE — Telephone Encounter (Signed)
CMA spoke to patient to inform on results and advising.  Patient verified DOB. Patient stated he would like his medication for his diabetes and meter to be send to [facility] due to no insurance.  Pt. Stated he is taking his cholesterol medication everyday.

## 2018-07-03 NOTE — Progress Notes (Signed)
Cardiology Office Note:    Date:  07/04/2018   ID:  Jeffrey Moores., DOB 02-16-1981, MRN [id number]  PCP:  Gildardo Pounds, NP  Cardiologist:  Shirlee More, MD   Referring MD: Marliss Coots, NP  ASSESSMENT:    1. Coronary artery disease involving native coronary artery of native heart without angina pectoris   2. Essential hypertension   3. Pure hypercholesterolemia   4. Claudication in peripheral vascular disease ([place])   5. Does not have health insurance   6. Pre-op evaluation    PLAN:    In order of problems listed above:  1. See discussion under history known CAD PCI and 2 bare-metal stents June 2018 now having class III angina he will continue medical therapy and referred for left heart catheterization and PCI if appropriate.  Note he had anaphylaxis with aspirin.  He has been maintained on single agent clopidogrel.  Risk benefits options detailed with the patient and wife they voiced understanding and agreed to heart catheterization 2. Stable blood pressure target continue current treatment 3. Stable continue high intensity statin 4. He has a diagnosis of claudication peripheral vascular disease, however ABIs were normal he has low back pain and neuropathic symptoms and I think he is pseudo-claudication. 5. Referred for social work consultation prior to elective coronary angiography  Next appointment 1 month   Medication Adjustments/Labs and Tests Ordered: Current medicines are reviewed at length with the patient today.  Concerns regarding medicines are outlined above.  Orders Placed This Encounter  Procedures  . DG Chest 2 View  . CBC  . Basic Metabolic Panel (BMET)  . Ambulatory referral to Social Work  . EKG 12-Lead   Meds ordered this encounter  Medications  . nitroGLYCERIN (NITROSTAT) 0.4 MG SL tablet    Sig: Place 1 tablet (0.4 mg total) under the tongue every 5 (five) minutes as needed for chest pain.    Dispense:  25 tablet    Refill:  11      Chief Complaint  Patient presents with  . Coronary Artery Disease    History of Present Illness:    Jeffrey Volante. is a 37 y.o. male who is being seen today for the evaluation of CAD with PCI and stent of LAD 03/01/17 at the request of Placey, Audrea Muscat, NP.  Has been under intense personal stress with the abdomen out of her teens and being relocated and financial distress.  Overall feels poorly he is now on CPAP for his obstructive sleep apnea he has marked exercise intolerance weakness and fatigue but he has a pattern of anginal discomfort diffuse precordial pressure without radiation with shortness of breath with and without physical activity relieved and unrelieved with rest and ice several episodes of week and the pattern is accelerated.  He has had an emergency room visit in July with chest pain normal troponins.  Reviewed the patient's history and drug-eluting stents and his wife thinks is because of her questions of his financial ability to afford long-term dual antiplatelet therapy with newer agents he is also allergic to aspirin with anaphylaxis.  He has been maintained on clopidogrel.  In view of his clinical presentation and high degree of suspicion of in-stent restenosis with 2 drug-eluting stents I advocated he undergo coronary angiography options benefits and risk detail the patient and wife agree will be scheduled for week at the request of social work work consult for financial assistance.  In the interim we will continue his current medical  treatment and is been given a new prescription for nitroglycerin. Past Medical History:  Diagnosis Date  . Arthritis   . Asthma   . Claudication ([place]) 04/02/2017  . Coronary artery disease involving native coronary artery of native heart without angina pectoris 04/02/2017  . Essential hypertension 03/07/2017  . Glucose intolerance (impaired glucose tolerance) 03/15/2017  . Hypertension   . Myocardial infarction ([place])   . Paresthesia of  lower extremity 04/02/2017  . Pure hypercholesterolemia 03/07/2017  . Snoring 03/13/2018  . Tobacco abuse 03/07/2017    Past Surgical History:  Procedure Laterality Date  . CARDIAC CATHETERIZATION    . cardiac stents    . CORONARY ANGIOPLASTY      Current Medications: Current Meds  Medication Sig  . albuterol (PROVENTIL HFA;VENTOLIN HFA) 108 (90 Base) MCG/ACT inhaler Inhale 2 puffs into the lungs every 6 (six) hours as needed for wheezing or shortness of breath.  Marland Kitchen amLODipine (NORVASC) 10 MG tablet Take 1 tablet (10 mg total) by mouth daily.  Marland Kitchen atorvastatin (LIPITOR) 40 MG tablet Take 1 tablet (40 mg total) by mouth at bedtime.  . Blood Glucose Monitoring Suppl (TRUE METRIX METER) w/Device KIT Use as instructed  . buPROPion (WELLBUTRIN SR) 150 MG 12 hr tablet Take 1 tablet (150 mg total) by mouth 2 (two) times daily.  . clopidogrel (PLAVIX) 75 MG tablet Take 1 tablet (75 mg total) by mouth daily.  . furosemide (LASIX) 20 MG tablet Take 1 tablet (20 mg total) by mouth daily as needed (feet and leg swelling).  . gabapentin (NEURONTIN) 400 MG capsule Take 2 capsules (800 mg total) by mouth 3 (three) times daily.  Marland Kitchen glucose blood (TRUE METRIX BLOOD GLUCOSE TEST) test strip Use as instructed  . hydrOXYzine (VISTARIL) 50 MG capsule Take 2 capsules (100 mg total) by mouth 3 (three) times daily.  Marland Kitchen ketoconazole (NIZORAL) 2 % cream Apply 1 application topically 2 (two) times daily. Apply to skin folds on abdomen  . lidocaine (XYLOCAINE) 2 % jelly Apply 1 application topically as needed.  Marland Kitchen losartan-hydrochlorothiazide (HYZAAR) 100-25 MG tablet Take 1 tablet by mouth daily.  . metoprolol tartrate (LOPRESSOR) 25 MG tablet Take 0.5 tablets (12.5 mg total) by mouth 2 (two) times daily.  . Multiple Vitamin (MULTIVITAMIN WITH MINERALS) TABS tablet Take 1 tablet by mouth daily.  Marland Kitchen tiZANidine (ZANAFLEX) 4 MG tablet Take 1 tablet (4 mg total) by mouth every 6 (six) hours as needed for muscle spasms.  .  traZODone (DESYREL) 150 MG tablet Take 1 tablet (150 mg total) by mouth at bedtime.  . TRUEPLUS LANCETS 28G MISC Use as instructed     Allergies:   Asa [aspirin]; Penicillins; Lemon flavor; Lisinopril; Other; Clindamycin; and Erythromycin   Social History   Socioeconomic History  . Marital status: Married    Spouse name: Not on file  . Number of children: Not on file  . Years of education: Not on file  . Highest education level: Not on file  Occupational History  . Not on file  Social Needs  . Financial resource strain: Not on file  . Food insecurity:    Worry: Not on file    Inability: Not on file  . Transportation needs:    Medical: Not on file    Non-medical: Not on file  Tobacco Use  . Smoking status: Current Every Day Smoker    Types: Cigarettes  . Smokeless tobacco: Former Systems developer    Types: Chew    Quit date: 2000  .  Tobacco comment: 4 cigarettes   Substance and Sexual Activity  . Alcohol use: Not Currently  . Drug use: Not Currently  . Sexual activity: Yes  Lifestyle  . Physical activity:    Days per week: Not on file    Minutes per session: Not on file  . Stress: Not on file  Relationships  . Social connections:    Talks on phone: Not on file    Gets together: Not on file    Attends religious service: Not on file    Active member of club or organization: Not on file    Attends meetings of clubs or organizations: Not on file    Relationship status: Not on file  Other Topics Concern  . Not on file  Social History Narrative  . Not on file     Family History: The patient's family history includes Heart attack in his mother; Heart disease in his mother; Hypertension in his father and mother.  ROS:   Review of Systems  Constitution: Positive for chills and malaise/fatigue.  HENT: Negative.   Eyes: Positive for visual disturbance.  Cardiovascular: Positive for chest pain and dyspnea on exertion.  Respiratory: Positive for shortness of breath and snoring.    Endocrine: Negative.   Hematologic/Lymphatic: Negative.   Skin: Negative.   Musculoskeletal: Positive for back pain.  Gastrointestinal: Negative.   Genitourinary: Negative.   Neurological: Positive for dizziness.  Psychiatric/Behavioral: Positive for depression. The patient is nervous/anxious.   Allergic/Immunologic: Negative.    Please see the history of present illness.     All other systems reviewed and are negative.  EKGs/Labs/Other Studies Reviewed:    The following studies were reviewed today:   EKG:  EKG is  ordered today.  The ekg ordered today demonstrates sinus rhythm left axis deviation  Recent Labs: 05/05/2018: Hemoglobin 14.8; Platelets 330 06/28/2018: ALT 49; BUN 10; Creatinine, Ser 0.90; Potassium 4.1; Sodium 137  Recent Lipid Panel    Component Value Date/Time   CHOL 178 06/28/2018 1213   TRIG 209 (H) 06/28/2018 1213   HDL 31 (L) 06/28/2018 1213   CHOLHDL 5.7 (H) 06/28/2018 1213   LDLCALC 105 (H) 06/28/2018 1213    Physical Exam:    VS:  BP 110/72 (BP Location: Left Arm, Patient Position: Sitting, Cuff Size: Large)   Pulse 72   Ht _0  (1.905 m)   Wt (!) 380 lb 12.8 oz (172.7 kg)   SpO2 97%   BMI 47.60 kg/m     Wt Readings from Last 3 Encounters:  07/04/18 (!) 380 lb 12.8 oz (172.7 kg)  06/28/18 (!) 379 lb 6.4 oz (172.1 kg)  03/27/18 (!) 364 lb (165.1 kg)     GEN:  Well nourished, well developed in no acute distress marked obesity HEENT: Normal NECK: No JVD; No carotid bruits LYMPHATICS: No lymphadenopathy CARDIAC: RRR, no murmurs, rubs, gallops RESPIRATORY:  Clear to auscultation without rales, wheezing or rhonchi  ABDOMEN: Soft, non-tender, non-distended MUSCULOSKELETAL:  No edema; No deformity  SKIN: Warm and dry NEUROLOGIC:  Alert and oriented x 3 PSYCHIATRIC:  Normal affect     Signed, Shirlee More, MD  07/04/2018 10:40 AM    [place]

## 2018-07-04 ENCOUNTER — Encounter

## 2018-07-04 ENCOUNTER — Encounter: Payer: Self-pay | Admitting: Cardiology

## 2018-07-04 ENCOUNTER — Ambulatory Visit (INDEPENDENT_AMBULATORY_CARE_PROVIDER_SITE_OTHER): Payer: Self-pay | Admitting: Cardiology

## 2018-07-04 ENCOUNTER — Other Ambulatory Visit: Payer: Self-pay | Admitting: Nurse Practitioner

## 2018-07-04 ENCOUNTER — Ambulatory Visit (HOSPITAL_BASED_OUTPATIENT_CLINIC_OR_DEPARTMENT_OTHER)
Admission: RE | Admit: 2018-07-04 | Discharge: 2018-07-04 | Disposition: A | Discharge status: Home / Self Care | Payer: Self-pay | Source: Ambulatory Visit | Attending: Cardiology | Admitting: Cardiology

## 2018-07-04 VITALS — BP 110/72 | HR 72 | Ht 75.0 in | Wt 380.8 lb

## 2018-07-04 DIAGNOSIS — Z5989 Other problems related to housing and economic circumstances: Secondary | ICD-10-CM

## 2018-07-04 DIAGNOSIS — Z01818 Encounter for other preprocedural examination: Secondary | ICD-10-CM | POA: Insufficient documentation

## 2018-07-04 DIAGNOSIS — I10 Essential (primary) hypertension: Secondary | ICD-10-CM

## 2018-07-04 DIAGNOSIS — I739 Peripheral vascular disease, unspecified: Secondary | ICD-10-CM

## 2018-07-04 DIAGNOSIS — Z598 Other problems related to housing and economic circumstances: Secondary | ICD-10-CM

## 2018-07-04 DIAGNOSIS — E7800 Pure hypercholesterolemia, unspecified: Secondary | ICD-10-CM

## 2018-07-04 DIAGNOSIS — I2510 Atherosclerotic heart disease of native coronary artery without angina pectoris: Secondary | ICD-10-CM

## 2018-07-04 IMAGING — DX DG CHEST 2V
2 series · 2 of 2 positions shown · non-contrast
Comparison: [DATE] and [DATE]

CLINICAL DATA: Preoperative study prior to heart catheterization.

EXAM:
CHEST - 2 VIEW

[chest pa]
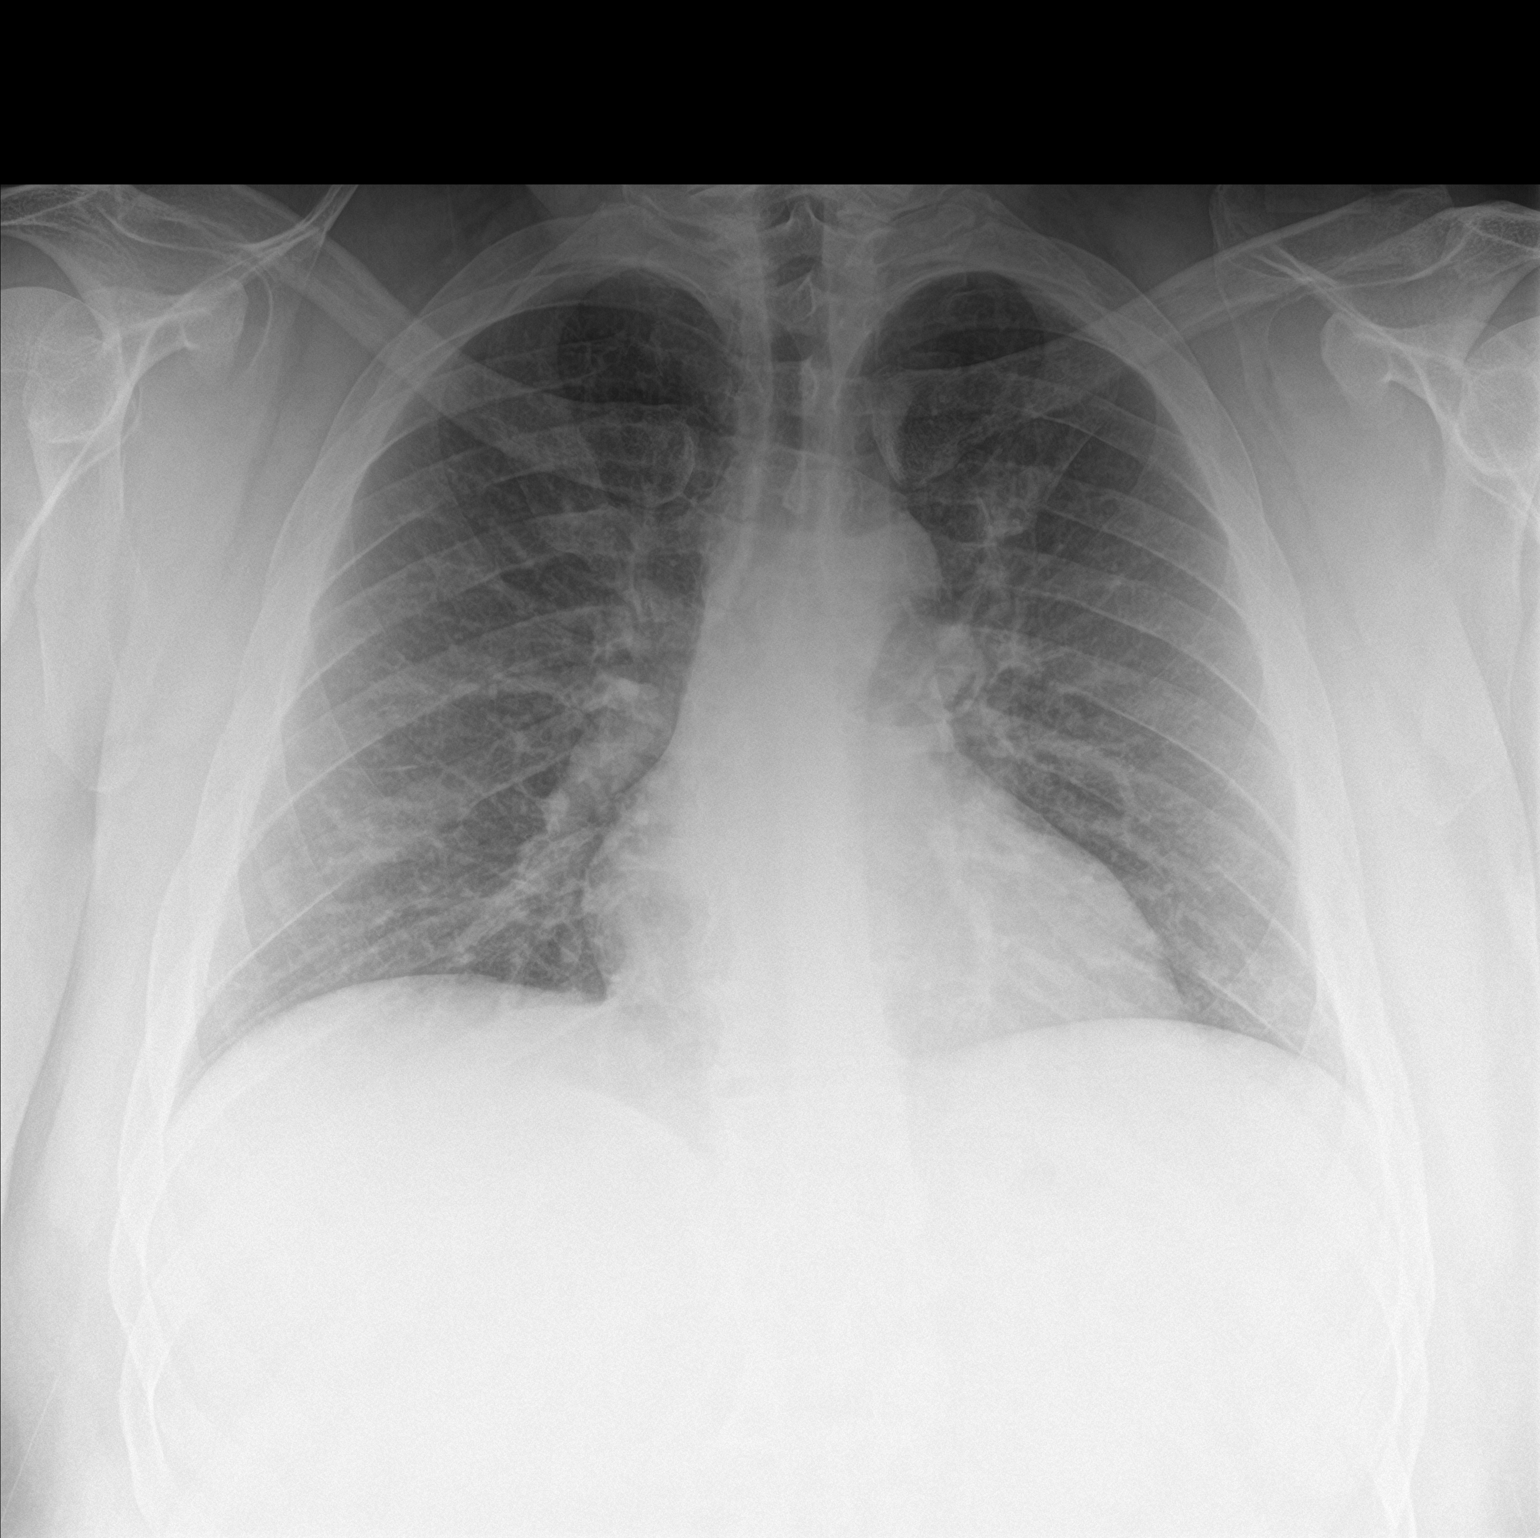

[chest lat]
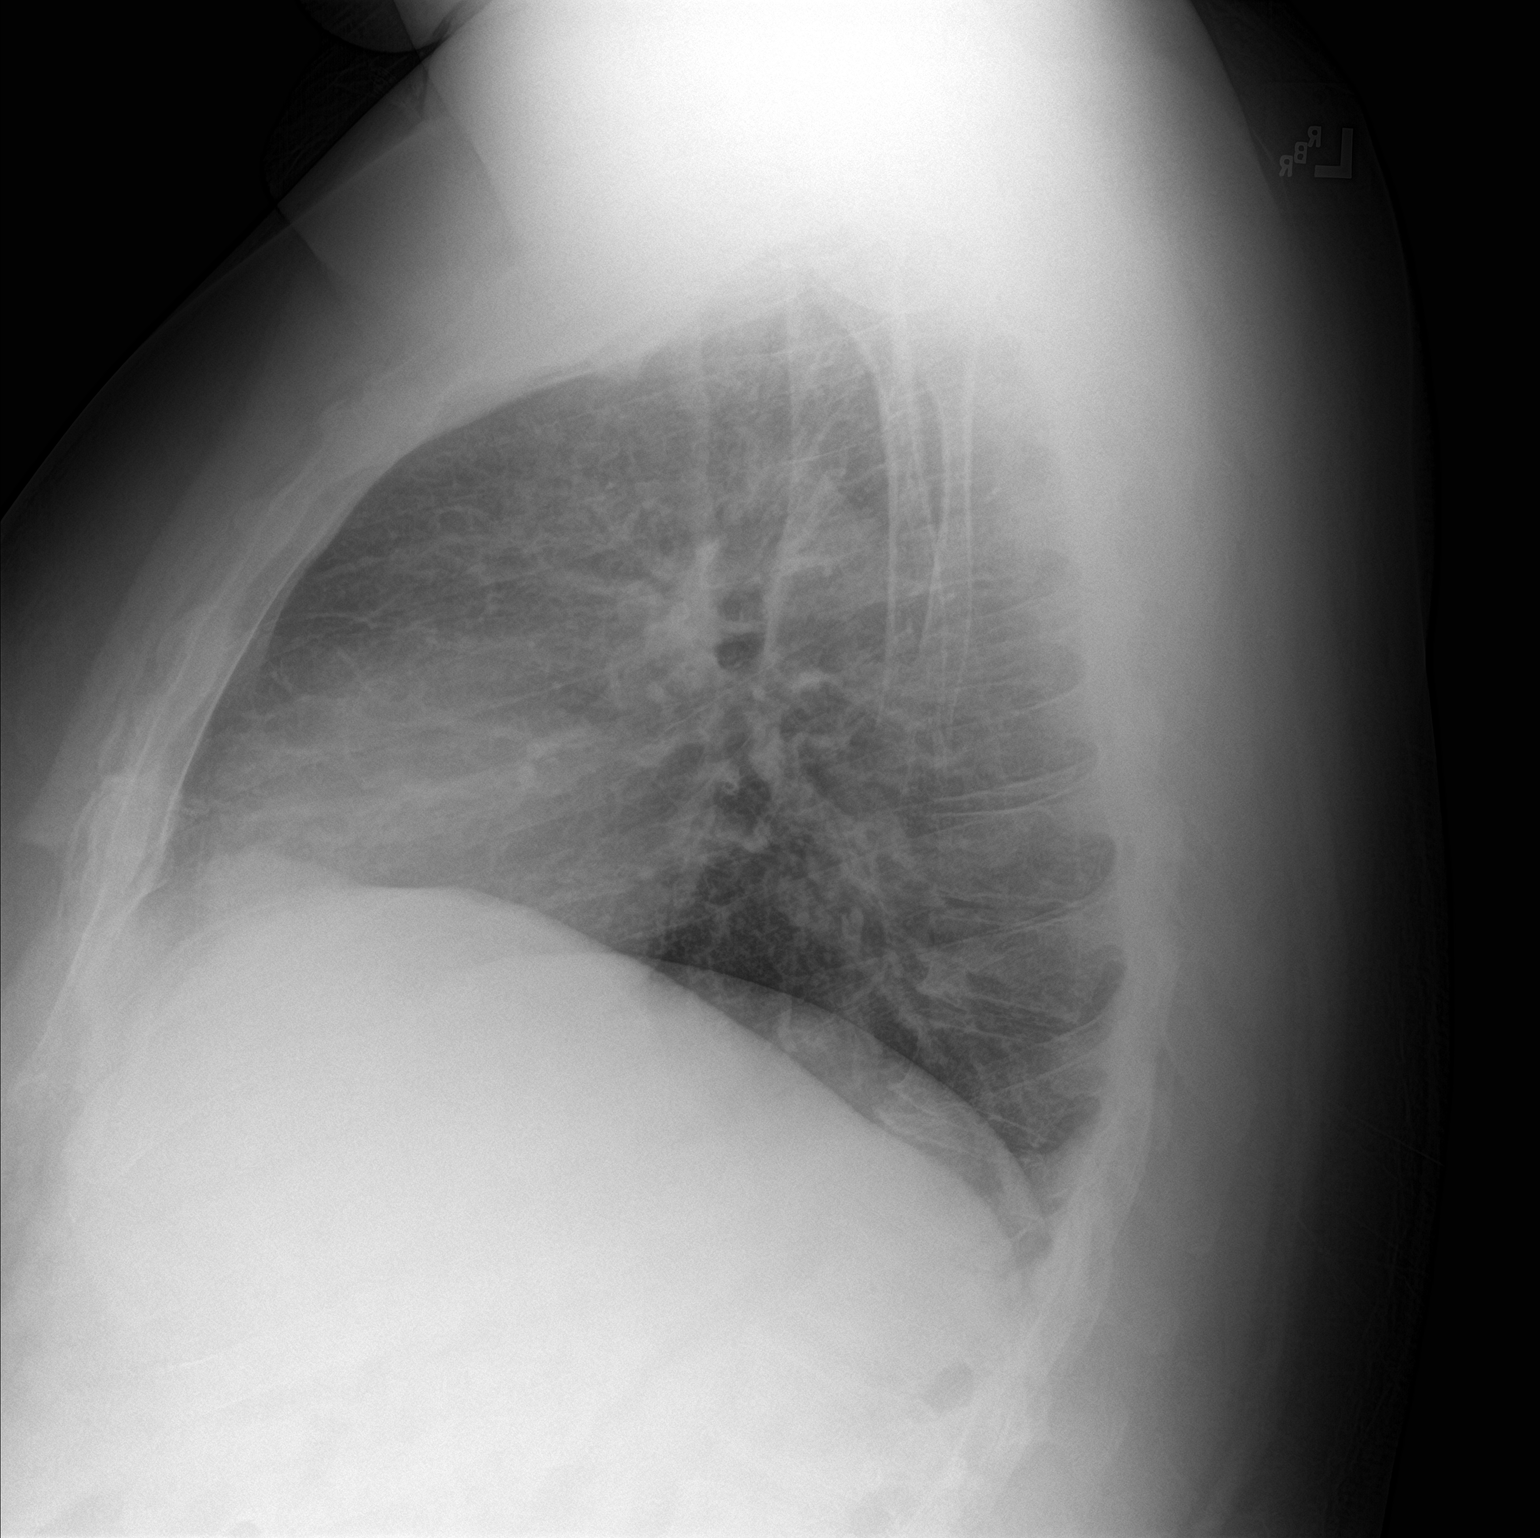

[2 of 2 positions shown; findings below may reference images not displayed]

FINDINGS: The heart size is borderline to mildly enlarged. The hila and
mediastinum are normal. Increased density over the left first
costochondral junction is likely degenerative and superimposition of
vascular and bony structures. No suspicious nodules or masses. No
focal infiltrates.
IMPRESSION: No active cardiopulmonary disease.

## 2018-07-04 MED ORDER — ACETAMINOPHEN 500 MG PO TABS: 1000.0000 mg | ORAL_TABLET | Freq: Three times a day (TID) | ORAL | 0 refills | Status: DC

## 2018-07-04 MED ORDER — METFORMIN HCL 500 MG PO TABS: 500.0000 mg | ORAL_TABLET | Freq: Two times a day (BID) | ORAL | 3 refills | Status: DC

## 2018-07-04 MED ORDER — NITROGLYCERIN 0.4 MG SL SUBL: 0.4000 mg | SUBLINGUAL_TABLET | SUBLINGUAL | 11 refills | Status: AC | PRN

## 2018-07-04 MED ORDER — TRUEPLUS LANCETS 28G MISC: 3 refills | Status: DC

## 2018-07-04 MED ORDER — GLUCOSE BLOOD VI STRP: ORAL_STRIP | 12 refills | Status: DC

## 2018-07-04 MED ORDER — TRUE METRIX METER W/DEVICE KIT: PACK | 0 refills | Status: DC

## 2018-07-04 NOTE — Telephone Encounter (Signed)
Patient want to know if he need to be any diabetic medication.

## 2018-07-04 NOTE — Addendum Note (Signed)
Addended by: Crist Fat on: 07/04/2018 02:24 PM   Modules accepted: Orders

## 2018-07-04 NOTE — Patient Instructions (Addendum)
Medication Instructions:  Your physician has recommended you make the following change in your medication:   START nitroglycerin as needed for chest pain: When having chest pain, stop what you are doing and sit down. Take 1 nitro, wait 5 minutes. Still having chest pain, take 1 nitro, wait 5 minutes. Still having chest pain, take 1 nitro, dial 911. Total of 3 nitro in 15 minutes.   If you need a refill on your cardiac medications before your next appointment, please call your pharmacy.   Lab work: Your physician recommends that you return for lab work today: CBC, BMP.   If you have labs (blood work) drawn today and your tests are completely normal, you will receive your results only by: Marland Kitchen MyChart Message (if you have MyChart) OR . A paper copy in the mail If you have any lab test that is abnormal or we need to change your treatment, we will call you to review the results.  Testing/Procedures: You had an EKG today.   A chest x-ray takes a picture of the organs and structures inside the chest, including the heart, lungs, and blood vessels. This test can show several things, including, whether the heart is enlarges; whether fluid is building up in the lungs; and whether pacemaker / defibrillator leads are still in place.      [facility] [place] [address], [address] [place] [address] Dept: [phone] Loc: [phone]  Jeffrey Barrera.  07/04/2018  You are scheduled for a Cardiac Catheterization on Thursday, October 24 with Dr. Cristal [place] End.  1. Please arrive at the [facility] (Main Entrance A) at [facility]: [address] [place], [place] [address] at 5:30 AM (This time is two hours before your procedure to ensure your preparation). Free valet parking service is available.   Special note: Every effort is made to have your procedure done on time. Please understand that emergencies  sometimes delay scheduled procedures.  2. Diet: Do not eat solid foods after midnight.  The patient may have clear liquids until 5am upon the day of the procedure.  3. Labs: None needed.  4. Medication instructions in preparation for your procedure:   Contrast Allergy: No  Stop furosemide (lasix) and losartan-hydrochlorothiazide on Wednesday, 07/10/18.   On the morning of your procedure, take your Plavix/Clopidogrel and any morning medicines NOT listed above.  You may use sips of water./  5. Plan for one night stay--bring personal belongings. 6. Bring a current list of your medications and current insurance cards. 7. You MUST have a responsible person to drive you home. 8. Someone MUST be with you the first 24 hours after you arrive home or your discharge will be delayed. 9. Please wear clothes that are easy to get on and off and wear slip-on shoes.  Thank you for allowing [place] to care for you!   -- [facility]   Follow-Up: At [facility], you and your health needs are our priority.  As part of our continuing mission to provide you with exceptional heart care, we have created designated Provider Care Teams.  These Care Teams include your primary Cardiologist (physician) and Advanced Practice Providers (APPs -  Physician Assistants and Nurse Practitioners) who all work together to provide you with the care you need, when you need it. . You will need a follow up appointment in 1 months.   Any Other Special Instructions Will Be Listed  Below (If Applicable). You will be contacted by a Child psychotherapist. Please fill out financial assistance paperwork and drop off at designated location/mail ASAP.   Nitroglycerin sublingual tablets What is this medicine? NITROGLYCERIN (nye troe GLI ser in) is a type of vasodilator. It relaxes blood vessels, increasing the blood and oxygen supply to your heart. This medicine is used to relieve chest pain caused by angina. It  is also used to prevent chest pain before activities like climbing stairs, going outdoors in cold weather, or sexual activity. This medicine may be used for other purposes; ask your health care provider or pharmacist if you have questions. COMMON BRAND NAME(S): Nitroquick, Nitrostat, Nitrotab What should I tell my health care provider before I take this medicine? They need to know if you have any of these conditions: -anemia -head injury, recent stroke, or bleeding in the brain -liver disease -previous heart attack -an unusual or allergic reaction to nitroglycerin, other medicines, foods, dyes, or preservatives -pregnant or trying to get pregnant -breast-feeding How should I use this medicine? Take this medicine by mouth as needed. At the first sign of an angina attack (chest pain or tightness) place one tablet under your tongue. You can also take this medicine 5 to 10 minutes before an event likely to produce chest pain. Follow the directions on the prescription label. Let the tablet dissolve under the tongue. Do not swallow whole. Replace the dose if you accidentally swallow it. It will help if your mouth is not dry. Saliva around the tablet will help it to dissolve more quickly. Do not eat or drink, smoke or chew tobacco while a tablet is dissolving. If you are not better within 5 minutes after taking ONE dose of nitroglycerin, call 9-1-1 immediately to seek emergency medical care. Do not take more than 3 nitroglycerin tablets over 15 minutes. If you take this medicine often to relieve symptoms of angina, your doctor or health care professional may provide you with different instructions to manage your symptoms. If symptoms do not go away after following these instructions, it is important to call 9-1-1 immediately. Do not take more than 3 nitroglycerin tablets over 15 minutes. Talk to your pediatrician regarding the use of this medicine in children. Special care may be needed. Overdosage: If you  think you have taken too much of this medicine contact a poison control center or emergency room at once. NOTE: This medicine is only for you. Do not share this medicine with others. What if I miss a dose? This does not apply. This medicine is only used as needed. What may interact with this medicine? Do not take this medicine with any of the following medications: -certain migraine medicines like ergotamine and dihydroergotamine (DHE) -medicines used to treat erectile dysfunction like sildenafil, tadalafil, and vardenafil -riociguat This medicine may also interact with the following medications: -alteplase -aspirin -heparin -medicines for high blood pressure -medicines for mental depression -other medicines used to treat angina -phenothiazines like chlorpromazine, mesoridazine, prochlorperazine, thioridazine This list may not describe all possible interactions. Give your health care provider a list of all the medicines, herbs, non-prescription drugs, or dietary supplements you use. Also tell them if you smoke, drink alcohol, or use illegal drugs. Some items may interact with your medicine. What should I watch for while using this medicine? Tell your doctor or health care professional if you feel your medicine is no longer working. Keep this medicine with you at all times. Sit or lie down when you take your medicine  to prevent falling if you feel dizzy or faint after using it. Try to remain calm. This will help you to feel better faster. If you feel dizzy, take several deep breaths and lie down with your feet propped up, or bend forward with your head resting between your knees. You may get drowsy or dizzy. Do not drive, use machinery, or do anything that needs mental alertness until you know how this drug affects you. Do not stand or sit up quickly, especially if you are an older patient. This reduces the risk of dizzy or fainting spells. Alcohol can make you more drowsy and dizzy. Avoid  alcoholic drinks. Do not treat yourself for coughs, colds, or pain while you are taking this medicine without asking your doctor or health care professional for advice. Some ingredients may increase your blood pressure. What side effects may I notice from receiving this medicine? Side effects that you should report to your doctor or health care professional as soon as possible: -blurred vision -dry mouth -skin rash -sweating -the feeling of extreme pressure in the head -unusually weak or tired Side effects that usually do not require medical attention (report to your doctor or health care professional if they continue or are bothersome): -flushing of the face or neck -headache -irregular heartbeat, palpitations -nausea, vomiting This list may not describe all possible side effects. Call your doctor for medical advice about side effects. You may report side effects to FDA at 1-800-FDA-1088. Where should I keep my medicine? Keep out of the reach of children. Store at room temperature between 20 and 25 degrees C (68 and 77 degrees F). Store in Retail buyer. Protect from light and moisture. Keep tightly closed. Throw away any unused medicine after the expiration date. NOTE: This sheet is a summary. It may not cover all possible information. If you have questions about this medicine, talk to your doctor, pharmacist, or health care provider.  2018 Elsevier/Gold Standard (2013-07-03 17:57:36)     Coronary Angiogram With Stent Coronary angiogram with stent placement is a procedure to widen or open a narrow blood vessel of the heart (coronary artery). Arteries may become blocked by cholesterol buildup (plaques) in the lining of the wall. When a coronary artery becomes partially blocked, blood flow to that area decreases. This may lead to chest pain or a heart attack (myocardial infarction). A stent is a small piece of metal that looks like mesh or a spring. Stent placement may be done as  treatment for a heart attack or right after a coronary angiogram in which a blocked artery is found. Let your health care provider know about:  Any allergies you have.  All medicines you are taking, including vitamins, herbs, eye drops, creams, and over-the-counter medicines.  Any problems you or family members have had with anesthetic medicines.  Any blood disorders you have.  Any surgeries you have had.  Any medical conditions you have.  Whether you are pregnant or may be pregnant. What are the risks? Generally, this is a safe procedure. However, problems may occur, including:  Damage to the heart or its blood vessels.  A return of blockage.  Bleeding, infection, or bruising at the insertion site.  A collection of blood under the skin (hematoma) at the insertion site.  A blood clot in another part of the body.  Kidney injury.  Allergic reaction to the dye or contrast that is used.  Bleeding into the abdomen (retroperitoneal bleeding).  What happens before the procedure? Staying hydrated Follow  instructions from your health care provider about hydration, which may include:  Up to 2 hours before the procedure - you may continue to drink clear liquids, such as water, clear fruit juice, black coffee, and plain tea.  Eating and drinking restrictions Follow instructions from your health care provider about eating and drinking, which may include:  8 hours before the procedure - stop eating heavy meals or foods such as meat, fried foods, or fatty foods.  6 hours before the procedure - stop eating light meals or foods, such as toast or cereal.  2 hours before the procedure - stop drinking clear liquids.  Ask your health care provider about:  Changing or stopping your regular medicines. This is especially important if you are taking diabetes medicines or blood thinners.  Taking medicines such as ibuprofen. These medicines can thin your blood. Do not take these medicines  before your procedure if your health care provider instructs you not to. Generally, aspirin is recommended before a procedure of passing a small, thin tube (catheter) through a blood vessel and into the heart (cardiac catheterization).  What happens during the procedure?  An IV tube will be inserted into one of your veins.  You will be given one or more of the following: ? A medicine to help you relax (sedative). ? A medicine to numb the area where the catheter will be inserted into an artery (local anesthetic).  To reduce your risk of infection: ? Your health care team will wash or sanitize their hands. ? Your skin will be washed with soap. ? Hair may be removed from the area where the catheter will be inserted.  Using a guide wire, the catheter will be inserted into an artery. The location may be in your groin, in your wrist, or in the fold of your arm (near your elbow).  A type of X-ray (fluoroscopy) will be used to help guide the catheter to the opening of the arteries in the heart.  A dye will be injected into the catheter, and X-rays will be taken. The dye will help to show where any narrowing or blockages are located in the arteries.  A tiny wire will be guided to the blocked spot, and a balloon will be inflated to make the artery wider.  The stent will be expanded and will crush the plaques into the wall of the vessel. The stent will hold the area open and improve the blood flow. Most stents have a drug coating to reduce the risk of the stent narrowing over time.  The artery may be made wider using a drill, laser, or other tools to remove plaques.  When the blood flow is better, the catheter will be removed. The lining of the artery will grow over the stent, which stays where it was placed. This procedure may vary among health care providers and hospitals. What happens after the procedure?  If the procedure is done through the leg, you will be kept in bed lying flat for about 6  hours. You will be instructed to not bend and not cross your legs.  The insertion site will be checked frequently.  The pulse in your foot or wrist will be checked frequently.  You may have additional blood tests, X-rays, and a test that records the electrical activity of your heart (electrocardiogram, or ECG). This information is not intended to replace advice given to you by your health care provider. Make sure you discuss any questions you have with your health care  provider. Document Released: 03/11/2003 Document Revised: 05/04/2016 Document Reviewed: 04/09/2016 Elsevier Interactive Patient Education  [employer].

## 2018-07-04 NOTE — Telephone Encounter (Signed)
Medications have been sent

## 2018-07-05 LAB — CBC
Hematocrit: 42.2 % (ref 37.5–51.0)
Hemoglobin: 14.3 g/dL (ref 13.0–17.7)
MCH: 28.7 pg (ref 26.6–33.0)
MCHC: 33.9 g/dL (ref 31.5–35.7)
MCV: 85 fL (ref 79–97)
Platelets: 345 10*3/uL (ref 150–450)
RBC: 4.99 x10E6/uL (ref 4.14–5.80)
RDW: 12.7 % (ref 12.3–15.4)
WBC: 11.1 10*3/uL — ABNORMAL HIGH (ref 3.4–10.8)

## 2018-07-05 LAB — BASIC METABOLIC PANEL
BUN / CREAT RATIO: 10 (ref 9–20)
BUN: 9 mg/dL (ref 6–20)
CO2: 24 mmol/L (ref 20–29)
Calcium: 9.5 mg/dL (ref 8.7–10.2)
Chloride: 97 mmol/L (ref 96–106)
Creatinine, Ser: 0.87 mg/dL (ref 0.76–1.27)
GFR, EST AFRICAN AMERICAN: 126 mL/min/{1.73_m2} (ref >59)
GFR, EST NON AFRICAN AMERICAN: 109 mL/min/{1.73_m2} (ref >59)
GLUCOSE: 212 mg/dL — AB (ref 65–99)
Potassium: 4.2 mmol/L (ref 3.5–5.2)
SODIUM: 140 mmol/L (ref 134–144)

## 2018-07-07 ENCOUNTER — Other Ambulatory Visit: Payer: Self-pay | Admitting: Nurse Practitioner

## 2018-07-07 MED ORDER — METFORMIN HCL 500 MG PO TABS: 500.0000 mg | ORAL_TABLET | Freq: Every day | ORAL | 1 refills | Status: DC

## 2018-07-07 NOTE — Telephone Encounter (Signed)
Yes metformin has been sent to the pharmacy

## 2018-07-08 NOTE — Telephone Encounter (Signed)
CMA spoke to patient to inform his meter and his diabetic medication are sent to [facility].  Pt. Understood.

## 2018-07-09 ENCOUNTER — Telehealth: Payer: Self-pay | Admitting: *Deleted

## 2018-07-09 NOTE — Telephone Encounter (Addendum)
Pt contacted pre-catheterization scheduled at [facility] for: Thursday July 11, 2018 7:30 AM Verified arrival time and place: [facility] Main Entrance A at: 5:30 AM  No solid food after midnight prior to cath, clear liquids until 5 AM day of procedure. Contrast allergy:no    Hold Metformin-day of procedure and 48 hours post procedure- pt states he has not started this medication. He is aware of metformin instruction. Losartan-HCTZ-AM of procedure. Furosemide-AM of procedure  Except hold medications AM meds can be  taken pre-cath with sip of water including: Clopidogrel 75 mg  Of note-allergic to aspirin per patient.   Confirmed patient has responsible person to drive home post procedure and for 24 hours after you arrive home: yes

## 2018-07-11 ENCOUNTER — Encounter (HOSPITAL_COMMUNITY)
Admission: RE | Disposition: A | Discharge status: Home / Self Care | Payer: Self-pay | Source: Ambulatory Visit | Attending: Internal Medicine

## 2018-07-11 ENCOUNTER — Other Ambulatory Visit: Payer: Self-pay

## 2018-07-11 ENCOUNTER — Ambulatory Visit (HOSPITAL_COMMUNITY)
Admission: RE | Admit: 2018-07-11 | Discharge: 2018-07-11 | Disposition: A | Discharge status: Home / Self Care | Payer: Self-pay | Source: Ambulatory Visit | Attending: Internal Medicine | Admitting: Internal Medicine

## 2018-07-11 DIAGNOSIS — I2089 Other forms of angina pectoris: Secondary | ICD-10-CM | POA: Diagnosis present

## 2018-07-11 DIAGNOSIS — M1990 Unspecified osteoarthritis, unspecified site: Secondary | ICD-10-CM | POA: Insufficient documentation

## 2018-07-11 DIAGNOSIS — Z8249 Family history of ischemic heart disease and other diseases of the circulatory system: Secondary | ICD-10-CM | POA: Insufficient documentation

## 2018-07-11 DIAGNOSIS — E7800 Pure hypercholesterolemia, unspecified: Secondary | ICD-10-CM | POA: Insufficient documentation

## 2018-07-11 DIAGNOSIS — Z881 Allergy status to other antibiotic agents status: Secondary | ICD-10-CM | POA: Insufficient documentation

## 2018-07-11 DIAGNOSIS — I208 Other forms of angina pectoris: Secondary | ICD-10-CM | POA: Diagnosis present

## 2018-07-11 DIAGNOSIS — R609 Edema, unspecified: Secondary | ICD-10-CM

## 2018-07-11 DIAGNOSIS — Z888 Allergy status to other drugs, medicaments and biological substances status: Secondary | ICD-10-CM | POA: Insufficient documentation

## 2018-07-11 DIAGNOSIS — Z880 Allergy status to penicillin: Secondary | ICD-10-CM | POA: Insufficient documentation

## 2018-07-11 DIAGNOSIS — Z79899 Other long term (current) drug therapy: Secondary | ICD-10-CM | POA: Insufficient documentation

## 2018-07-11 DIAGNOSIS — F17210 Nicotine dependence, cigarettes, uncomplicated: Secondary | ICD-10-CM | POA: Insufficient documentation

## 2018-07-11 DIAGNOSIS — Z955 Presence of coronary angioplasty implant and graft: Secondary | ICD-10-CM | POA: Insufficient documentation

## 2018-07-11 DIAGNOSIS — G4733 Obstructive sleep apnea (adult) (pediatric): Secondary | ICD-10-CM | POA: Insufficient documentation

## 2018-07-11 DIAGNOSIS — I25118 Atherosclerotic heart disease of native coronary artery with other forms of angina pectoris: Secondary | ICD-10-CM | POA: Insufficient documentation

## 2018-07-11 DIAGNOSIS — Z886 Allergy status to analgesic agent status: Secondary | ICD-10-CM | POA: Insufficient documentation

## 2018-07-11 DIAGNOSIS — I252 Old myocardial infarction: Secondary | ICD-10-CM | POA: Insufficient documentation

## 2018-07-11 DIAGNOSIS — Z7902 Long term (current) use of antithrombotics/antiplatelets: Secondary | ICD-10-CM | POA: Insufficient documentation

## 2018-07-11 DIAGNOSIS — J45909 Unspecified asthma, uncomplicated: Secondary | ICD-10-CM | POA: Insufficient documentation

## 2018-07-11 DIAGNOSIS — Y831 Surgical operation with implant of artificial internal device as the cause of abnormal reaction of the patient, or of later complication, without mention of misadventure at the time of the procedure: Secondary | ICD-10-CM | POA: Insufficient documentation

## 2018-07-11 DIAGNOSIS — I10 Essential (primary) hypertension: Secondary | ICD-10-CM | POA: Insufficient documentation

## 2018-07-11 DIAGNOSIS — T82855A Stenosis of coronary artery stent, initial encounter: Secondary | ICD-10-CM | POA: Insufficient documentation

## 2018-07-11 HISTORY — PX: LEFT HEART CATH AND CORONARY ANGIOGRAPHY: CATH118249

## 2018-07-11 LAB — GLUCOSE, CAPILLARY: Glucose-Capillary: 176 mg/dL — ABNORMAL HIGH (ref 70–99)

## 2018-07-11 SURGERY — LEFT HEART CATH AND CORONARY ANGIOGRAPHY
Anesthesia: LOCAL

## 2018-07-11 MED ORDER — FENTANYL CITRATE (PF) 100 MCG/2ML IJ SOLN
INTRAMUSCULAR | Status: DC | PRN
  Administered 2018-07-11: 50 ug via INTRAVENOUS

## 2018-07-11 MED ORDER — HEPARIN (PORCINE) IN NACL 1000-0.9 UT/500ML-% IV SOLN
INTRAVENOUS | Status: DC | PRN
  Administered 2018-07-11 (×3): 500 mL

## 2018-07-11 MED ORDER — ONDANSETRON HCL 4 MG/2ML IJ SOLN: 4.0000 mg | Freq: Four times a day (QID) | INTRAMUSCULAR | Status: DC | PRN

## 2018-07-11 MED ORDER — IOHEXOL 350 MG/ML SOLN
INTRAVENOUS | Status: DC | PRN
  Administered 2018-07-11: 70 mL via INTRA_ARTERIAL

## 2018-07-11 MED ORDER — VERAPAMIL HCL 2.5 MG/ML IV SOLN
INTRAVENOUS | Status: DC | PRN
  Administered 2018-07-11: 10 mL via INTRA_ARTERIAL

## 2018-07-11 MED ORDER — LIDOCAINE HCL (PF) 1 % IJ SOLN
INTRAMUSCULAR | Status: DC | PRN
  Administered 2018-07-11: 2 mL via INTRADERMAL

## 2018-07-11 MED ORDER — ACETAMINOPHEN 325 MG PO TABS
650.0000 mg | ORAL_TABLET | ORAL | Status: DC | PRN
  Administered 2018-07-11: 650 mg via ORAL
  Filled 2018-07-11: qty 2

## 2018-07-11 MED ORDER — HEPARIN SODIUM (PORCINE) 1000 UNIT/ML IJ SOLN
INTRAMUSCULAR | Status: DC | PRN
  Administered 2018-07-11: 5000 [IU] via INTRAVENOUS

## 2018-07-11 MED ORDER — SODIUM CHLORIDE 0.9 % WEIGHT BASED INFUSION: 1.0000 mL/kg/h | INTRAVENOUS | Status: DC

## 2018-07-11 MED ORDER — LIDOCAINE HCL (PF) 1 % IJ SOLN
INTRAMUSCULAR | Status: AC
  Filled 2018-07-11: qty 30

## 2018-07-11 MED ORDER — HEPARIN (PORCINE) IN NACL 1000-0.9 UT/500ML-% IV SOLN
INTRAVENOUS | Status: AC
  Filled 2018-07-11: qty 1000

## 2018-07-11 MED ORDER — FENTANYL CITRATE (PF) 100 MCG/2ML IJ SOLN
INTRAMUSCULAR | Status: AC
  Filled 2018-07-11: qty 2

## 2018-07-11 MED ORDER — SODIUM CHLORIDE 0.9% FLUSH: 3.0000 mL | Freq: Two times a day (BID) | INTRAVENOUS | Status: DC

## 2018-07-11 MED ORDER — SODIUM CHLORIDE 0.9 % IV SOLN: 250.0000 mL | INTRAVENOUS | Status: DC | PRN

## 2018-07-11 MED ORDER — METFORMIN HCL 500 MG PO TABS: 500.0000 mg | ORAL_TABLET | Freq: Every day | ORAL | 1 refills | Status: DC

## 2018-07-11 MED ORDER — FUROSEMIDE 10 MG/ML IJ SOLN
20.0000 mg | Freq: Once | INTRAMUSCULAR | Status: AC
  Administered 2018-07-11: 20 mg via INTRAVENOUS
  Filled 2018-07-11: qty 4

## 2018-07-11 MED ORDER — SODIUM CHLORIDE 0.9 % WEIGHT BASED INFUSION
3.0000 mL/kg/h | INTRAVENOUS | Status: AC
  Administered 2018-07-11: 3 mL/kg/h via INTRAVENOUS

## 2018-07-11 MED ORDER — VERAPAMIL HCL 2.5 MG/ML IV SOLN
INTRAVENOUS | Status: AC
  Filled 2018-07-11: qty 2

## 2018-07-11 MED ORDER — FUROSEMIDE 40 MG PO TABS: 40.0000 mg | ORAL_TABLET | Freq: Every day | ORAL | 5 refills | Status: DC

## 2018-07-11 MED ORDER — MIDAZOLAM HCL 2 MG/2ML IJ SOLN
INTRAMUSCULAR | Status: DC | PRN
  Administered 2018-07-11: 1 mg via INTRAVENOUS

## 2018-07-11 MED ORDER — SODIUM CHLORIDE 0.9% FLUSH: 3.0000 mL | INTRAVENOUS | Status: DC | PRN

## 2018-07-11 MED ORDER — MIDAZOLAM HCL 2 MG/2ML IJ SOLN
INTRAMUSCULAR | Status: AC
  Filled 2018-07-11: qty 2

## 2018-07-11 SURGICAL SUPPLY — 14 items
BRACE RADIAL COMPRESSION RADST (HEMOSTASIS) ×2 IMPLANT
CATH INFINITI 5FR ANG PIGTAIL (CATHETERS) ×2 IMPLANT
CATH OPTITORQUE TIG 4.0 5F (CATHETERS) ×2 IMPLANT
DEVICE RAD COMP TR BAND LRG (VASCULAR PRODUCTS) ×2 IMPLANT
GLIDESHEATH SLEND A-KIT 6F 22G (SHEATH) ×2 IMPLANT
GUIDEWIRE INQWIRE 1.5J.035X260 (WIRE) ×1 IMPLANT
HOVERMATT SINGLE USE (MISCELLANEOUS) ×2 IMPLANT
INQWIRE 1.5J .035X260CM (WIRE) ×2
KIT HEART LEFT (KITS) ×2 IMPLANT
PACK CARDIAC CATHETERIZATION (CUSTOM PROCEDURE TRAY) ×2 IMPLANT
SHEATH PROBE COVER 6X72 (BAG) ×2 IMPLANT
SYR MEDRAD MARK V 150ML (SYRINGE) ×2 IMPLANT
TRANSDUCER W/STOPCOCK (MISCELLANEOUS) ×2 IMPLANT
TUBING CIL FLEX 10 FLL-RA (TUBING) ×2 IMPLANT

## 2018-07-11 NOTE — Brief Op Note (Signed)
BRIEF CARDIAC CATHETERIZATION NOTE  07/11/2018  8:12 AM  PATIENT:  Jeffrey Barrera.  37 y.o. male  PRE-OPERATIVE DIAGNOSIS:  Stable angina  POST-OPERATIVE DIAGNOSIS:  CAD and HFpEF  PROCEDURE:  Procedure(s): LEFT HEART CATH AND CORONARY ANGIOGRAPHY (N/A)  SURGEON:  Surgeon(s) and Role:    * Dannon Perlow, MD - Primary  FINDINGS: 1. Mild, non-obstructive CAD. 2. Patent overlapping stents in the mid RCA with mild ISR. 3. Moderately to severely elevated LVEDP with normal LVEF.  RECOMMENDATIONS: 1. Medical therapy of chronic diastolic HF.  Will give furosemide 20 mg IV x1 today and discharge with furosemide 40 mg PO daily. 2. Aggressive secondary prevention. 3. Encourage weight loss. 4. Will need BMP in ~1 week with Dr. Dulce Sellar.  Yvonne Kendall, MD [facility] Pager: [phone]

## 2018-07-11 NOTE — Discharge Instructions (Signed)
NO METFORMIN/GLUCOPHAGE FOR 2 DAYS ° ° ° °Radial Site Care °Refer to this sheet in the next few weeks. These instructions provide you with information about caring for yourself after your procedure. Your health care provider may also give you more specific instructions. Your treatment has been planned according to current medical practices, but problems sometimes occur. Call your health care provider if you have any problems or questions after your procedure. °What can I expect after the procedure? °After your procedure, it is typical to have the following: °· Bruising at the radial site that usually fades within 1-2 weeks. °· Blood collecting in the tissue (hematoma) that may be painful to the touch. It should usually decrease in size and tenderness within 1-2 weeks. ° °Follow these instructions at home: °· Take medicines only as directed by your health care provider. °· You may shower 24-48 hours after the procedure or as directed by your health care provider. Remove the bandage (dressing) and gently wash the site with plain soap and water. Pat the area dry with a clean towel. Do not rub the site, because this may cause bleeding. °· Do not take baths, swim, or use a hot tub until your health care provider approves. °· Check your insertion site every day for redness, swelling, or drainage. °· Do not apply powder or lotion to the site. °· Do not flex or bend the affected arm for 24 hours or as directed by your health care provider. °· Do not push or pull heavy objects with the affected arm for 24 hours or as directed by your health care provider. °· Do not lift over 10 lb (4.5 kg) for 5 days after your procedure or as directed by your health care provider. °· Ask your health care provider when it is okay to: °? Return to work or school. °? Resume usual physical activities or sports. °? Resume sexual activity. °· Do not drive home if you are discharged the same day as the procedure. Have someone else drive  you. °· You may drive 24 hours after the procedure unless otherwise instructed by your health care provider. °· Do not operate machinery or power tools for 24 hours after the procedure. °· If your procedure was done as an outpatient procedure, which means that you went home the same day as your procedure, a responsible adult should be with you for the first 24 hours after you arrive home. °· Keep all follow-up visits as directed by your health care provider. This is important. °Contact a health care provider if: °· You have a fever. °· You have chills. °· You have increased bleeding from the radial site. Hold pressure on the site. °Get help right away if: °· You have unusual pain at the radial site. °· You have redness, warmth, or swelling at the radial site. °· You have drainage (other than a small amount of blood on the dressing) from the radial site. °· The radial site is bleeding, and the bleeding does not stop after 30 minutes of holding steady pressure on the site. °· Your arm or hand becomes pale, cool, tingly, or numb. °This information is not intended to replace advice given to you by your health care provider. Make sure you discuss any questions you have with your health care provider. °Document Released: 10/07/2010 Document Revised: 02/10/2016 Document Reviewed: 03/23/2014 °Elsevier Interactive Patient Education © 2018 Elsevier Inc. °Moderate Conscious Sedation, Adult, Care After °These instructions provide you with information about caring for yourself after your   procedure. Your health care provider may also give you more specific instructions. Your treatment has been planned according to current medical practices, but problems sometimes occur. Call your health care provider if you have any problems or questions after your procedure. °What can I expect after the procedure? °After your procedure, it is common: °· To feel sleepy for several hours. °· To feel clumsy and have poor balance for several  hours. °· To have poor judgment for several hours. °· To vomit if you eat too soon. ° °Follow these instructions at home: °For at least 24 hours after the procedure: ° °· Do not: °? Participate in activities where you could fall or become injured. °? Drive. °? Use heavy machinery. °? Drink alcohol. °? Take sleeping pills or medicines that cause drowsiness. °? Make important decisions or sign legal documents. °? Take care of children on your own. °· Rest. °Eating and drinking °· Follow the diet recommended by your health care provider. °· If you vomit: °? Drink water, juice, or soup when you can drink without vomiting. °? Make sure you have little or no nausea before eating solid foods. °General instructions °· Have a responsible adult stay with you until you are awake and alert. °· Take over-the-counter and prescription medicines only as told by your health care provider. °· If you smoke, do not smoke without supervision. °· Keep all follow-up visits as told by your health care provider. This is important. °Contact a health care provider if: °· You keep feeling nauseous or you keep vomiting. °· You feel light-headed. °· You develop a rash. °· You have a fever. °Get help right away if: °· You have trouble breathing. °This information is not intended to replace advice given to you by your health care provider. Make sure you discuss any questions you have with your health care provider. °Document Released: 06/25/2013 Document Revised: 02/07/2016 Document Reviewed: 12/25/2015 °Elsevier Interactive Patient Education © 2018 Elsevier Inc. ° °

## 2018-07-11 NOTE — Interval H&P Note (Signed)
History and Physical Interval Note:  07/11/2018 7:17 AM  Jeffrey Barrera.  has presented today for cardiac catheterization, with the diagnosis of stable angina. The various methods of treatment have been discussed with the patient and family. After consideration of risks, benefits and other options for treatment, the patient has consented to  Procedure(s): LEFT HEART CATH AND CORONARY ANGIOGRAPHY (N/A) as a surgical intervention .  The patient's history has been reviewed, patient examined, no change in status, stable for surgery.  I have reviewed the patient's chart and labs.  Questions were answered to the patient's satisfaction.    Cath Lab Visit (complete for each Cath Lab visit)  Clinical Evaluation Leading to the Procedure:   ACS: No.  Non-ACS:    Anginal Classification: CCS III  Anti-ischemic medical therapy: Maximal Therapy (2 or more classes of medications)  Non-Invasive Test Results: No non-invasive testing performed  Prior CABG: No previous CABG  Jeffrey Barrera

## 2018-07-12 ENCOUNTER — Encounter (HOSPITAL_COMMUNITY): Payer: Self-pay | Admitting: Internal Medicine

## 2018-07-14 ENCOUNTER — Encounter (HOSPITAL_COMMUNITY): Payer: Self-pay | Admitting: *Deleted

## 2018-07-14 ENCOUNTER — Other Ambulatory Visit: Payer: Self-pay

## 2018-07-14 ENCOUNTER — Emergency Department (HOSPITAL_COMMUNITY)
Admission: EM | Admit: 2018-07-14 | Discharge: 2018-07-14 | Discharge status: Against Medical Advice | Payer: Self-pay | Attending: Emergency Medicine | Admitting: Emergency Medicine

## 2018-07-14 DIAGNOSIS — Z7901 Long term (current) use of anticoagulants: Secondary | ICD-10-CM | POA: Insufficient documentation

## 2018-07-14 DIAGNOSIS — F17210 Nicotine dependence, cigarettes, uncomplicated: Secondary | ICD-10-CM | POA: Insufficient documentation

## 2018-07-14 DIAGNOSIS — M25531 Pain in right wrist: Secondary | ICD-10-CM | POA: Insufficient documentation

## 2018-07-14 DIAGNOSIS — I2510 Atherosclerotic heart disease of native coronary artery without angina pectoris: Secondary | ICD-10-CM | POA: Insufficient documentation

## 2018-07-14 DIAGNOSIS — M79641 Pain in right hand: Secondary | ICD-10-CM | POA: Insufficient documentation

## 2018-07-14 DIAGNOSIS — I10 Essential (primary) hypertension: Secondary | ICD-10-CM | POA: Insufficient documentation

## 2018-07-14 DIAGNOSIS — Z79899 Other long term (current) drug therapy: Secondary | ICD-10-CM | POA: Insufficient documentation

## 2018-07-14 DIAGNOSIS — R200 Anesthesia of skin: Secondary | ICD-10-CM | POA: Insufficient documentation

## 2018-07-14 DIAGNOSIS — J45909 Unspecified asthma, uncomplicated: Secondary | ICD-10-CM | POA: Insufficient documentation

## 2018-07-14 DIAGNOSIS — R202 Paresthesia of skin: Secondary | ICD-10-CM | POA: Insufficient documentation

## 2018-07-14 MED ORDER — HYDROCODONE-ACETAMINOPHEN 5-325 MG PO TABS
1.0000 | ORAL_TABLET | Freq: Once | ORAL | Status: AC
  Administered 2018-07-14: 1 via ORAL
  Filled 2018-07-14: qty 1

## 2018-07-14 NOTE — ED Notes (Signed)
Pt was given Vicodin for pain and pt ask what was next, I explained to pt that lab work would be drawn.  Pt voices understanding, even though after this RN walked away, pt and person with him left going down back hallway.  MD made aware.

## 2018-07-14 NOTE — ED Provider Notes (Signed)
[facility] EMERGENCY DEPARTMENT Provider Note   CSN: [id number] Arrival date & time: 07/14/18  1500     History   Chief Complaint Chief Complaint  Patient presents with  . post cathrt arm swellling    HPI Jeffrey Barrera. is a 37 y.o. male for evaluation of right wrist swelling after a heart cath 3 days ago.  He had a right heart cath 3 days ago, access point was the right radial artery.  He denies complications during the procedure states that the next morning he woke up with numbness in his right fingers.  He wore his immobilizer as directed but over the past 3 days has experienced worsening numbness and swelling.  The numbness involves all of his fingers and extends to his forearms.  The swelling is most notable around his wrist and forearm.  Endorses severe pain at rest and with movement.  He has taken Tylenol 1000 mg TID, ice, elevation with no relief.  Denies fevers or chills.  His appetite has been normal.  HPI  Past Medical History:  Diagnosis Date  . Arthritis   . Asthma   . Claudication ([place]) 04/02/2017  . Coronary artery disease involving native coronary artery of native heart without angina pectoris 04/02/2017  . Essential hypertension 03/07/2017  . Glucose intolerance (impaired glucose tolerance) 03/15/2017  . Hypertension   . Myocardial infarction ([place])   . Paresthesia of lower extremity 04/02/2017  . Pure hypercholesterolemia 03/07/2017  . Snoring 03/13/2018  . Tobacco abuse 03/07/2017    Patient Active Problem List   Diagnosis Date Noted  . Stable angina ([place]) 07/11/2018  . Sleep apnea 03/13/2018  . Claudication in peripheral vascular disease ([place]) 04/02/2017  . Coronary artery disease involving native coronary artery of native heart without angina pectoris 04/02/2017  . Paresthesia of lower extremity 04/02/2017  . Glucose intolerance (impaired glucose tolerance) 03/15/2017  . Essential hypertension 03/07/2017  . Pure hypercholesterolemia  03/07/2017  . Tobacco abuse 03/07/2017    Past Surgical History:  Procedure Laterality Date  . CARDIAC CATHETERIZATION    . cardiac stents    . CORONARY ANGIOPLASTY    . LEFT HEART CATH AND CORONARY ANGIOGRAPHY N/A 07/11/2018   Procedure: LEFT HEART CATH AND CORONARY ANGIOGRAPHY;  Surgeon: Nelva Bush, MD;  Location: [place] [facility];  Service: Cardiovascular;  Laterality: N/A;        Home Medications    Prior to Admission medications   Medication Sig Start Date End Date Taking? Authorizing Provider  acetaminophen (TYLENOL) 500 MG tablet Take 2 tablets (1,000 mg total) by mouth 3 (three) times daily. 07/04/18   Richardo Priest, MD  albuterol (PROVENTIL HFA;VENTOLIN HFA) 108 (90 Base) MCG/ACT inhaler Inhale 2 puffs into the lungs every 6 (six) hours as needed for wheezing or shortness of breath.    [provider]  amLODipine (NORVASC) 10 MG tablet Take 1 tablet (10 mg total) by mouth daily. 06/28/18   Gildardo Pounds, NP  atorvastatin (LIPITOR) 40 MG tablet Take 1 tablet (40 mg total) by mouth at bedtime. 06/28/18   Gildardo Pounds, NP  Blood Glucose Monitoring Suppl (TRUE METRIX METER) w/Device KIT Use as instructed 07/04/18   Gildardo Pounds, NP  buPROPion [facility] SR) 150 MG 12 hr tablet Take 1 tablet (150 mg total) by mouth 2 (two) times daily. 06/28/18   Gildardo Pounds, NP  clopidogrel (PLAVIX) 75 MG tablet Take 1 tablet (75 mg total) by mouth daily. 06/28/18  Gildardo Pounds, NP  furosemide (LASIX) 40 MG tablet Take 1 tablet (40 mg total) by mouth daily. 07/11/18   Cheryln Manly, NP  gabapentin (NEURONTIN) 400 MG capsule Take 2 capsules (800 mg total) by mouth 3 (three) times daily. 06/28/18   Gildardo Pounds, NP  glucose blood (TRUE METRIX BLOOD GLUCOSE TEST) test strip Use as instructed 07/04/18   Gildardo Pounds, NP  hydrOXYzine (VISTARIL) 50 MG capsule Take 2 capsules (100 mg total) by mouth 3 (three) times daily. 06/28/18 07/28/18  Gildardo Pounds, NP  ketoconazole (NIZORAL) 2 % cream Apply 1 application topically 2 (two) times daily. Apply to skin folds on abdomen    [provider]  lidocaine (XYLOCAINE) 2 % jelly Apply 1 application topically as needed. Patient taking differently: Apply 1 application topically 2 (two) times daily.  05/05/18   Recardo Evangelist, PA-C  losartan-hydrochlorothiazide (HYZAAR) 100-25 MG tablet Take 1 tablet by mouth daily. 06/28/18   Gildardo Pounds, NP  metFORMIN (GLUCOPHAGE) 500 MG tablet Take 1 tablet (500 mg total) by mouth daily with breakfast. 07/14/18   End, Harrell Gave, MD  metoprolol tartrate (LOPRESSOR) 25 MG tablet Take 0.5 tablets (12.5 mg total) by mouth 2 (two) times daily. 06/28/18 07/28/18  Gildardo Pounds, NP  Multiple Vitamin (MULTIVITAMIN WITH MINERALS) TABS tablet Take 1 tablet by mouth daily.    [provider]  nitroGLYCERIN (NITROSTAT) 0.4 MG SL tablet Place 1 tablet (0.4 mg total) under the tongue every 5 (five) minutes as needed for chest pain. 07/04/18 10/02/18  Richardo Priest, MD  tiZANidine (ZANAFLEX) 4 MG tablet Take 1 tablet (4 mg total) by mouth every 6 (six) hours as needed for muscle spasms. 06/28/18   Gildardo Pounds, NP  traZODone (DESYREL) 150 MG tablet Take 1 tablet (150 mg total) by mouth at bedtime. 06/28/18 09/26/18  Gildardo Pounds, NP  TRUEPLUS LANCETS 28G MISC Use as instructed 07/04/18   Gildardo Pounds, NP    Family History Family History  Problem Relation Age of Onset  . Hypertension Mother   . Heart attack Mother   . Heart disease Mother   . Hypertension Father     Social History Social History   Tobacco Use  . Smoking status: Current Every Day Smoker    Types: Cigarettes  . Smokeless tobacco: Former Systems developer    Types: Chew    Quit date: 2000  . Tobacco comment: 4 cigarettes   Substance Use Topics  . Alcohol use: Not Currently  . Drug use: Not Currently     Allergies   Asa [aspirin]; Penicillins; Lemon flavor;  Lisinopril; Other; Clindamycin; and Erythromycin   Review of Systems Review of Systems A complete ROS was negative except as per HPI.  General: Denies fever, chills, change in appetite  Respiratory: Denies SOB, cough Cardiovascular: Denies chest pain.  Gastrointestinal: Denies nausea, vomiting, abdominal pain Genitourinary: Denies dysuria Endocrine: Denies polyuria,  Musculoskeletal: +joint swelling, +back pain, Denies  gait problem.  Skin: Denies pallor, rash and wounds.  Neurological: +numbness, Denies dizziness, headaches,  Psychiatric/Behavioral: Denies mood changes, confusion   Physical Exam Updated Vital Signs BP (!) 128/101 (BP Location: Left Arm)   Pulse (!) 105   Temp 98.8 F (37.1 C) (Oral)   Resp 16   Ht '6\' 3"'  (1.905 m)   Wt (!) 172.4 kg   SpO2 94%   BMI 47.50 kg/m   Physical Exam Vitals:   07/14/18 1531 07/14/18 1554  BP: (!) 128/101   Pulse: (!) 105   Resp: 16   Temp: 98.8 F (37.1 C)   TempSrc: Oral   SpO2: 94%   Weight:  (!) 172.4 kg  Height:  '6\' 3"'  (1.905 m)   General: Vital signs reviewed.  Patient is well-developed and well-nourished, in no acute distress and cooperative with exam.  Head: Normocephalic and atraumatic. Eyes: EOMI, conjunctivae normal, no scleral icterus.  Neck: Supple, trachea midline, normal ROM Cardiovascular: RRR, S1 normal, S2 normal, no murmurs, gallops, or rubs. Pulmonary/Chest: Clear to auscultation bilaterally, no wheezes, rales, or rhonchi. Abdominal: Soft, non-tender, non-distended, BS + Extremities: Right wrist/forearm mildly swollen without erythema or warmth. In tact radial and ulnar pulses. Good capillary refill. Tender to palpation with light touch. Pain out of proportion. Diffuse numbness over hand, including dorsal and ventral surfaces and all fingers.  Neurological: A&O x3, Strength is normal and symmetric bilaterally, cranial nerve II-XII are grossly intact Skin: Warm, dry and intact. No rashes or  erythema. Psychiatric: Normal mood and affect. speech and behavior is normal. Cognition and memory are normal.    ED Treatments / Results  Labs (all labs ordered are listed, but only abnormal results are displayed) Labs Reviewed  CBC WITH DIFFERENTIAL/PLATELET  BASIC METABOLIC PANEL    EKG EKG Interpretation  Date/Time:  'Sunday July 14 2018 15:34:20 EDT Ventricular Rate:  102 PR Interval:  144 QRS Duration: 100 QT Interval:  350 QTC Calculation: 456 R Axis:   -2 Text Interpretation:  Sinus tachycardia Minimal voltage criteria for LVH, may be normal variant Confirmed by Adam, [place] ([id number]) on 07/14/2018 5:13:23 PM   Radiology No results found.  Procedures Procedures (including critical care time)  Medications Ordered in ED Medications  HYDROcodone-acetaminophen (NORCO/VICODIN) 5-325 MG per tablet 1 tablet (1 tablet Oral Given 07/14/18 1719)     Initial Impression / Assessment and Plan / ED Course  I have reviewed the triage vital signs and the nursing notes.  Pertinent labs & imaging results that were available during my care of the patient were reviewed by me and considered in my medical decision making (see chart for details).     37' yo male presenting with 3 days of worsening right hand numbness, swelling, and pain after a right heart cath via right radial artery access. Radial and ulnar pulses are intact, extremity is warm with good color, good capillary refill. Diffuse paresthesias and pain out of proportion. Strength appears in tact but difficult to assess due to pain. Decreased ROM of wrist and decreased ability to make a fist. Concerning for compartment syndrome vs arterial occulsion vs spasm. Will consult hand surgery.   Doubt radial artery occlusion due to intact radial and ulnar pulses and good capillary refill. Doubt compartment syndrome due to good capillary refill and good color. Will consult cardiology.    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   Patient eloped after receiving Vicodin and before I was able to up date him with our recommendations.   Final Clinical Impressions(s) / ED Diagnoses   Final diagnoses:  Numbness and tingling of right hand    ED Discharge Orders    None       Isabelle Course, MD 07/14/18 Dionicia Abler    Lennice Sites, DO 07/14/18 1944

## 2018-07-14 NOTE — ED Provider Notes (Signed)
I have personally seen and examined the patient. I have reviewed the documentation on PMH/FH/Soc Hx. I have discussed the plan of care with the resident and patient.  I have reviewed and agree with the resident's documentation. Please see associated encounter note.  Briefly, the patient is a 37 y.o. male here with right hand pain, right wrist pain.  Patient with normal vitals.  No fever.  Patient with heart catheterization via right radial artery several days ago and has had some numbness and pain in his right hand ever since.  Patient has been using an immobilizer since.  He states that he continues to have numbness and some swelling.  Patient with strong palpable radial and ulnar pulses on exam.  Has good capillary refill in all of his fingers.  Has normal motor function of his right hand.  He has decreased sensation over the first through fourth digits of his right hand.  Has overall soft compartments of the hand.  Patient with no obvious signs of compartment syndrome.  Talked with Dr. Aundria Rud with hand surgery and after discussion believe that this is pretty unlikely in a patient 4 days out from this type of procedure.  Has good motor strength and pain is not out of proportion.  Low concern for compartment syndrome.  Patient with strong arterial pulses and no concern for arterial thrombus.  Patient with history and physical that is most consistent with likely median nerve irritation.  Patient eloped from the ED prior to full evaluation and further recommendations.  Patient needs to elevate extremity and use anti-inflammatories and follow-up with interventionalists, PCP.  However patient eloped prior to further planning.  This chart was dictated using voice recognition software.  Despite best efforts to proofread,  errors can occur which can change the documentation meaning.    EKG Interpretation  Date/Time:  Sunday July 14 2018 15:34:20 EDT Ventricular Rate:  102 PR Interval:  144 QRS  Duration: 100 QT Interval:  350 QTC Calculation: 456 R Axis:   -2 Text Interpretation:  Sinus tachycardia Minimal voltage criteria for LVH, may be normal variant Confirmed by Virgina Norfolk [phone]) on 07/14/2018 5:13:23 PM         Virgina Norfolk, DO 07/14/18 1944

## 2018-07-14 NOTE — ED Triage Notes (Signed)
The pt is c/o his rt arm from mid-forearm to his rt hand swollen and painful since he had a cardiac cath last Thursday.  They entered his rt wrist for the cath.  He has a good radial artery  Good cap refill.  Touching his radial artery to check his pulse is painful

## 2018-07-17 ENCOUNTER — Telehealth: Payer: Self-pay | Admitting: *Deleted

## 2018-07-17 NOTE — Telephone Encounter (Signed)
Attempted to contact patient with no answer, unable to leave voicemail. Following up to check on the status of financial assistance and to see if patient was contacted by the Social Work Department with additional resources. Will discuss further at scheduled appointment on 08/08/18.

## 2018-07-26 ENCOUNTER — Encounter: Payer: Self-pay | Admitting: Pharmacist

## 2018-08-02 ENCOUNTER — Ambulatory Visit: Payer: Self-pay | Admitting: Cardiology

## 2018-08-07 NOTE — Progress Notes (Deleted)
Cardiology Office Note:    Date:  08/07/2018   ID:  Jeffrey Barrera., DOB Jan 18, 1981, MRN [id number]  PCP:  Claiborne Rigg, NP  Cardiologist:  Norman Herrlich, MD    Referring MD: Claiborne Rigg, NP    ASSESSMENT:    No diagnosis found. PLAN:    In order of problems listed above:  1. ***   Next appointment: ***   Medication Adjustments/Labs and Tests Ordered: Current medicines are reviewed at length with the patient today.  Concerns regarding medicines are outlined above.  No orders of the defined types were placed in this encounter.  No orders of the defined types were placed in this encounter.   No chief complaint on file.   History of Present Illness:    Jeffrey Barrera. is a 37 y.o. male with a hx of ED with PCI and stent left anterior descending coronary artery 03/01/2017 last seen by me 07/04/2018.  He is referred for left  heart catheterization showed mild nonobstructive CAD patent stents and severely elevated left ventricular filling pressure consistent with diastolic heart failure.  He was initiated on a loop diuretic. Compliance with diet, lifestyle and medications: *** Past Medical History:  Diagnosis Date  . Arthritis   . Asthma   . Claudication ([facility]) 04/02/2017  . Coronary artery disease involving native coronary artery of native heart without angina pectoris 04/02/2017  . Essential hypertension 03/07/2017  . Glucose intolerance (impaired glucose tolerance) 03/15/2017  . Hypertension   . Myocardial infarction ([facility])   . Paresthesia of lower extremity 04/02/2017  . Pure hypercholesterolemia 03/07/2017  . Snoring 03/13/2018  . Tobacco abuse 03/07/2017    Past Surgical History:  Procedure Laterality Date  . CARDIAC CATHETERIZATION    . cardiac stents    . CORONARY ANGIOPLASTY    . LEFT HEART CATH AND CORONARY ANGIOGRAPHY N/A 07/11/2018   Procedure: LEFT HEART CATH AND CORONARY ANGIOGRAPHY;  Surgeon: Yvonne Kendall, MD;  Location: [facility];   Service: Cardiovascular;  Laterality: N/A;    Current Medications: No outpatient medications have been marked as taking for the 08/08/18 encounter (Appointment) with Baldo Daub, MD.     Allergies:   Asa [aspirin]; Penicillins; Lemon flavor; Lisinopril; Other; Clindamycin; and Erythromycin   Social History   Socioeconomic History  . Marital status: Married    Spouse name: Not on file  . Number of children: Not on file  . Years of education: Not on file  . Highest education level: Not on file  Occupational History  . Not on file  Social Needs  . Financial resource strain: Not on file  . Food insecurity:    Worry: Not on file    Inability: Not on file  . Transportation needs:    Medical: Not on file    Non-medical: Not on file  Tobacco Use  . Smoking status: Current Every Day Smoker    Types: Cigarettes  . Smokeless tobacco: Former Neurosurgeon    Types: Chew    Quit date: 2000  . Tobacco comment: 4 cigarettes   Substance and Sexual Activity  . Alcohol use: Not Currently  . Drug use: Not Currently  . Sexual activity: Yes  Lifestyle  . Physical activity:    Days per week: Not on file    Minutes per session: Not on file  . Stress: Not on file  Relationships  . Social connections:    Talks on phone: Not on file    Gets together:  Not on file    Attends religious service: Not on file    Active member of club or organization: Not on file    Attends meetings of clubs or organizations: Not on file    Relationship status: Not on file  Other Topics Concern  . Not on file  Social History Narrative  . Not on file     Family History: The patient's ***family history includes Heart attack in his mother; Heart disease in his mother; Hypertension in his father and mother. ROS:   Please see the history of present illness.    All other systems reviewed and are negative.  EKGs/Labs/Other Studies Reviewed:    The following studies were reviewed today:  EKG:  EKG ordered today.   The ekg ordered today demonstrates ***  Recent Labs: 06/28/2018: ALT 49 07/04/2018: BUN 9; Creatinine, Ser 0.87; Hemoglobin 14.3; Platelets 345; Potassium 4.2; Sodium 140  Recent Lipid Panel    Component Value Date/Time   CHOL 178 06/28/2018 1213   TRIG 209 (H) 06/28/2018 1213   HDL 31 (L) 06/28/2018 1213   CHOLHDL 5.7 (H) 06/28/2018 1213   LDLCALC 105 (H) 06/28/2018 1213    Physical Exam:    VS:  There were no vitals taken for this visit.    Wt Readings from Last 3 Encounters:  07/14/18 (!) 380 lb (172.4 kg)  07/11/18 (!) 380 lb (172.4 kg)  07/04/18 (!) 380 lb 12.8 oz (172.7 kg)     GEN: *** Well nourished, well developed in no acute distress HEENT: Normal NECK: No JVD; No carotid bruits LYMPHATICS: No lymphadenopathy CARDIAC: ***RRR, no murmurs, rubs, gallops RESPIRATORY:  Clear to auscultation without rales, wheezing or rhonchi  ABDOMEN: Soft, non-tender, non-distended MUSCULOSKELETAL:  No edema; No deformity  SKIN: Warm and dry NEUROLOGIC:  Alert and oriented x 3 PSYCHIATRIC:  Normal affect    Signed, Norman HerrlichBrian , MD  08/07/2018 1:13 PM    [facility]

## 2018-08-08 ENCOUNTER — Ambulatory Visit: Payer: Self-pay | Admitting: Cardiology

## 2018-09-25 ENCOUNTER — Telehealth: Payer: Self-pay | Admitting: Nurse Practitioner

## 2018-09-25 NOTE — Telephone Encounter (Signed)
Patient came in to see what financial assistance is available for his medications due to him still waiting for the IRS non-filing status letter. Please follow up.

## 2018-09-25 NOTE — Telephone Encounter (Signed)
LVM explain him what he need to bring and schedule an appt with financial

## 2018-11-18 ENCOUNTER — Emergency Department (HOSPITAL_COMMUNITY): Payer: Self-pay

## 2018-11-18 ENCOUNTER — Emergency Department (HOSPITAL_COMMUNITY)
Admission: EM | Admit: 2018-11-18 | Discharge: 2018-11-18 | Disposition: A | Discharge status: Home / Self Care | Payer: Self-pay | Attending: Emergency Medicine | Admitting: Emergency Medicine

## 2018-11-18 ENCOUNTER — Encounter (HOSPITAL_COMMUNITY): Payer: Self-pay | Admitting: Emergency Medicine

## 2018-11-18 ENCOUNTER — Other Ambulatory Visit: Payer: Self-pay

## 2018-11-18 DIAGNOSIS — B9789 Other viral agents as the cause of diseases classified elsewhere: Secondary | ICD-10-CM | POA: Insufficient documentation

## 2018-11-18 DIAGNOSIS — B372 Candidiasis of skin and nail: Secondary | ICD-10-CM | POA: Insufficient documentation

## 2018-11-18 DIAGNOSIS — I2510 Atherosclerotic heart disease of native coronary artery without angina pectoris: Secondary | ICD-10-CM | POA: Insufficient documentation

## 2018-11-18 DIAGNOSIS — J45909 Unspecified asthma, uncomplicated: Secondary | ICD-10-CM | POA: Insufficient documentation

## 2018-11-18 DIAGNOSIS — Z79899 Other long term (current) drug therapy: Secondary | ICD-10-CM | POA: Insufficient documentation

## 2018-11-18 DIAGNOSIS — I10 Essential (primary) hypertension: Secondary | ICD-10-CM | POA: Insufficient documentation

## 2018-11-18 DIAGNOSIS — Z7901 Long term (current) use of anticoagulants: Secondary | ICD-10-CM | POA: Insufficient documentation

## 2018-11-18 DIAGNOSIS — F17210 Nicotine dependence, cigarettes, uncomplicated: Secondary | ICD-10-CM | POA: Insufficient documentation

## 2018-11-18 DIAGNOSIS — Z7984 Long term (current) use of oral hypoglycemic drugs: Secondary | ICD-10-CM | POA: Insufficient documentation

## 2018-11-18 DIAGNOSIS — I252 Old myocardial infarction: Secondary | ICD-10-CM | POA: Insufficient documentation

## 2018-11-18 DIAGNOSIS — J069 Acute upper respiratory infection, unspecified: Secondary | ICD-10-CM | POA: Insufficient documentation

## 2018-11-18 DIAGNOSIS — E119 Type 2 diabetes mellitus without complications: Secondary | ICD-10-CM | POA: Insufficient documentation

## 2018-11-18 IMAGING — CR DG CHEST 2V
2 series · 2 of 2 positions shown · non-contrast
Comparison: Chest radiographs [DATE] and earlier.

CLINICAL DATA: 37-year-old male with intermittent fever cough and
shortness of breath for 6 weeks. Smoker.

EXAM:
CHEST - 2 VIEW

[w chest pa]
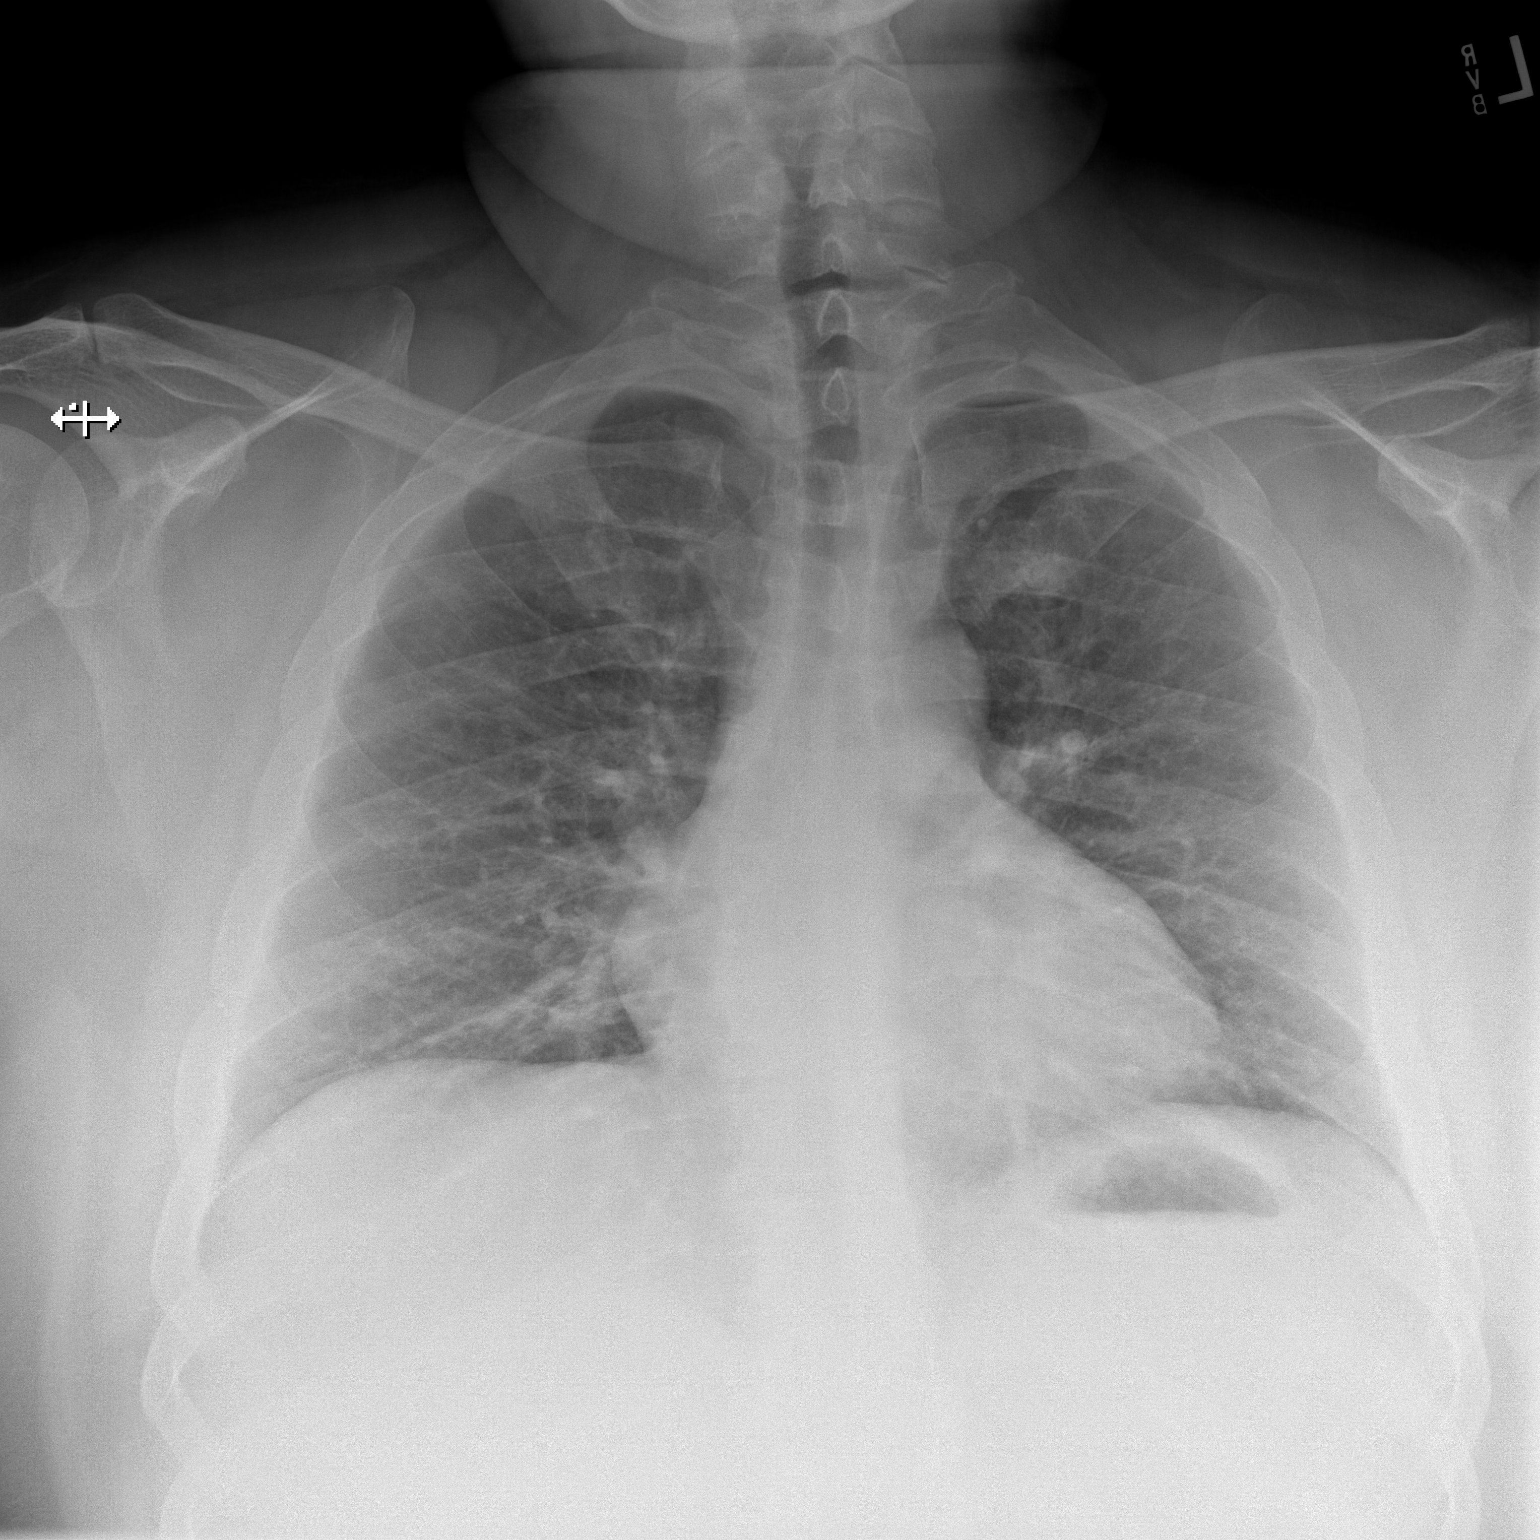

[w chest lat]
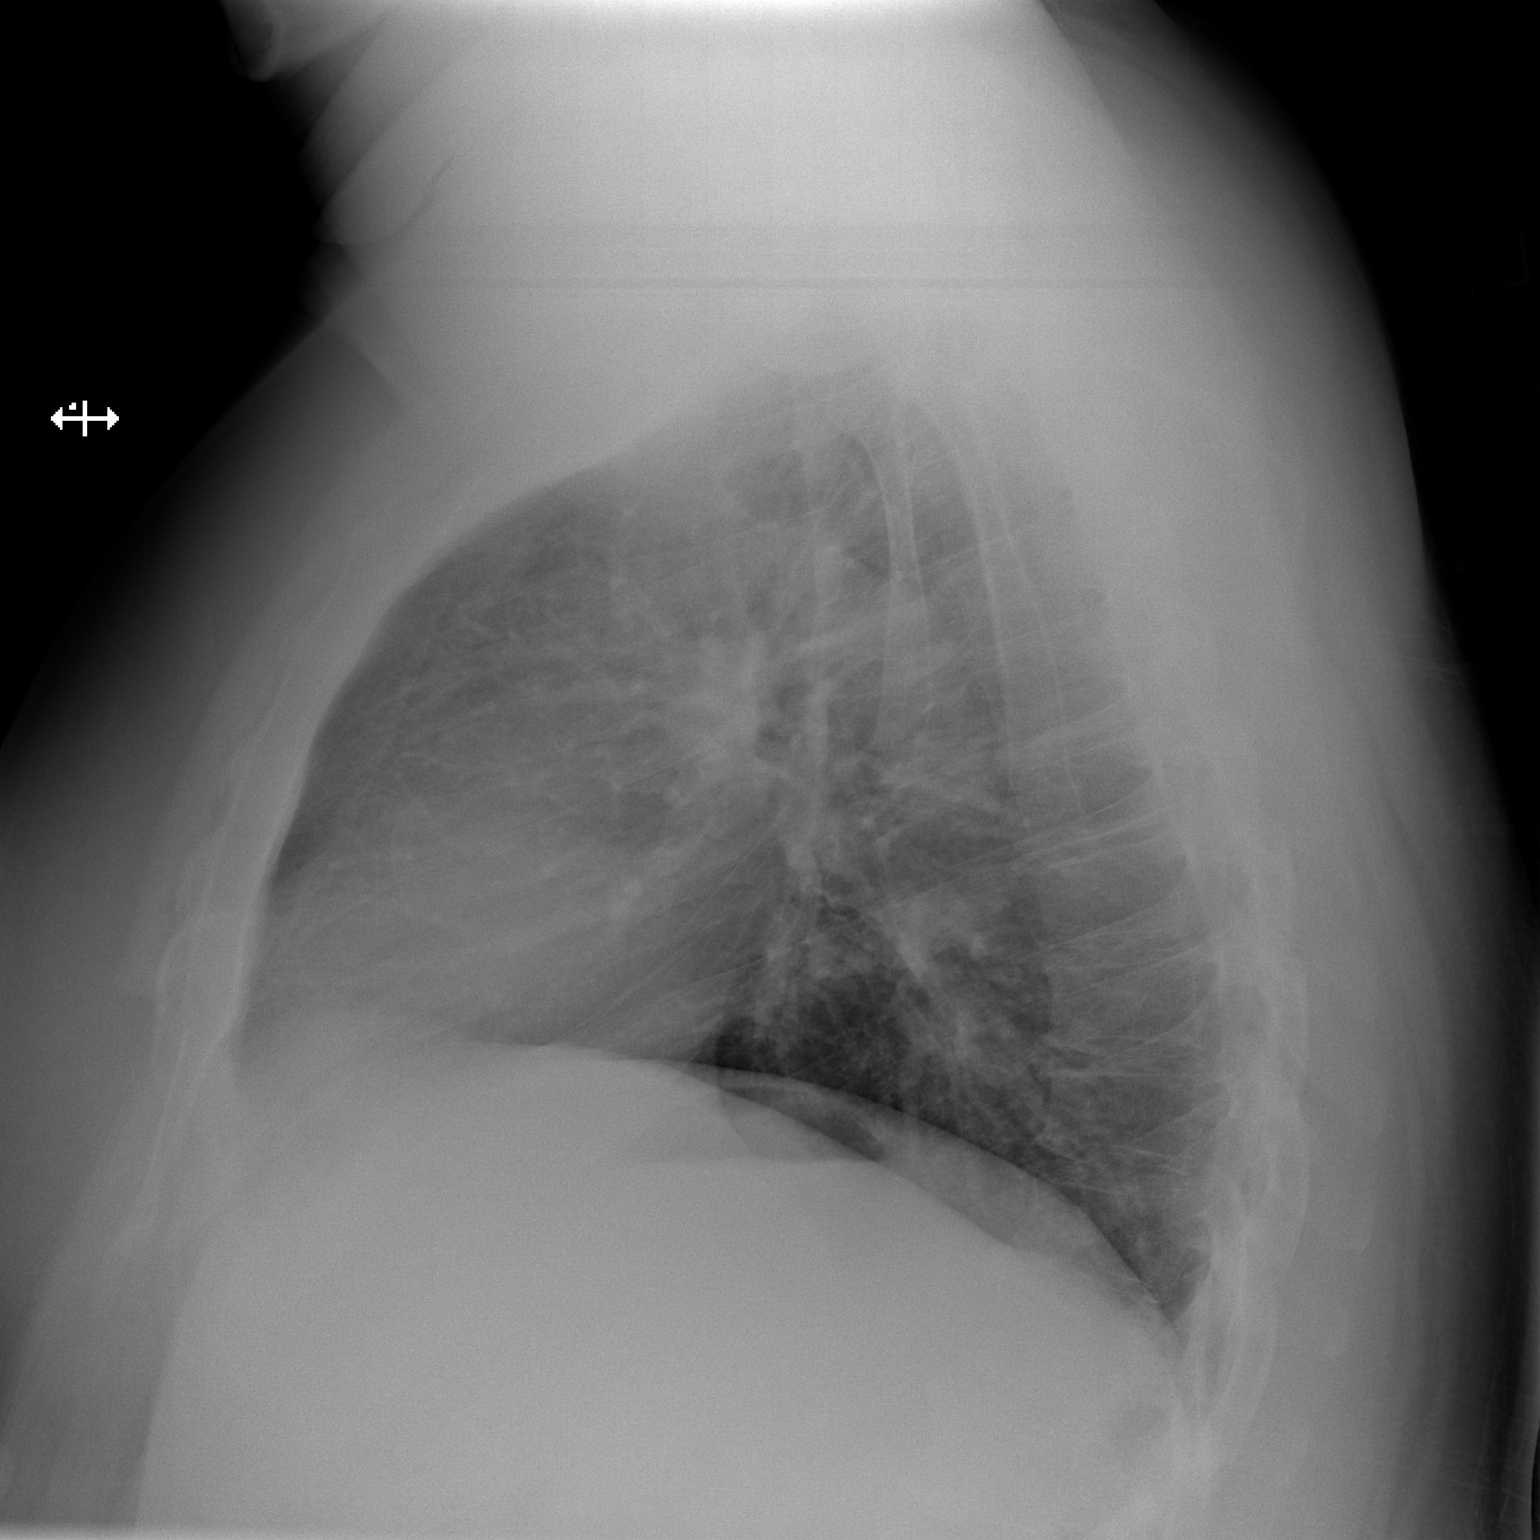

[2 of 2 positions shown; findings below may reference images not displayed]

FINDINGS: Lung volumes and mediastinal contours remain normal. Visualized
tracheal air column is within normal limits.

Mild chronic increased pulmonary interstitial markings appear stable
since [7M]. No superimposed pneumothorax, pulmonary edema, pleural
effusion or confluent pulmonary opacity. No acute osseous
abnormality identified. Negative visible bowel gas pattern.
IMPRESSION: Mild chronic increased interstitial markings which are probably
smoking related. No acute cardiopulmonary abnormality.

## 2018-11-18 MED ORDER — ALBUTEROL SULFATE HFA 108 (90 BASE) MCG/ACT IN AERS
1.0000 | INHALATION_SPRAY | Freq: Once | RESPIRATORY_TRACT | Status: AC
  Administered 2018-11-18: 2 via RESPIRATORY_TRACT
  Filled 2018-11-18: qty 6.7

## 2018-11-18 MED ORDER — BENZONATATE 100 MG PO CAPS: 100.0000 mg | ORAL_CAPSULE | Freq: Three times a day (TID) | ORAL | 0 refills | Status: DC | PRN

## 2018-11-18 MED ORDER — ALBUTEROL SULFATE HFA 108 (90 BASE) MCG/ACT IN AERS: 1.0000 | INHALATION_SPRAY | Freq: Four times a day (QID) | RESPIRATORY_TRACT | 0 refills | Status: DC | PRN

## 2018-11-18 MED ORDER — KETOCONAZOLE 2 % EX CREA: TOPICAL_CREAM | CUTANEOUS | 0 refills | Status: DC

## 2018-11-18 MED ORDER — ACETAMINOPHEN 325 MG PO TABS
650.0000 mg | ORAL_TABLET | Freq: Once | ORAL | Status: AC
  Administered 2018-11-18: 650 mg via ORAL
  Filled 2018-11-18: qty 2

## 2018-11-18 NOTE — ED Provider Notes (Signed)
[facility] DEPT Provider Note   CSN: [id number] Arrival date & time: 11/18/18  1014    History   Chief Complaint Chief Complaint  Patient presents with  . Cough    HPI Jeffrey Barrera. is a 38 y.o. male with history of asthma, hypertension, MI presenting to emergency department today with chief complaint of cough, fever, shortness of breath x 1 week.    Patient states he had an upper respiratory infection x6 weeks ago and got better, he was feeling like his normal self.  However after spending some time outside he is now having similar symptoms for the last week.  He reports he had T-max of 100.3 yesterday.  He has a nonproductive cough.  He reports he has associated shortness of breath after coughing episodes.  He denies any associated pain.  He denies chest pain, nausea, vomiting.  He has been taking Tylenol for his fever and reports symptom relief.  Patient is also reporting "a boil" on his abdomen.  He says it has been there x4 days.  He reports associated itching.  Patient has had history of the same.  He was prescribed ketoconazole cream and the symptoms improved.  He says the area is sore to the touch. Rates the pain 3/10 in severity. The pain does not radiate.        Past Medical History:  Diagnosis Date  . Arthritis   . Asthma   . Claudication ([place]) 04/02/2017  . Coronary artery disease involving native coronary artery of native heart without angina pectoris 04/02/2017  . Essential hypertension 03/07/2017  . Glucose intolerance (impaired glucose tolerance) 03/15/2017  . Hypertension   . Myocardial infarction ([employer])   . Paresthesia of lower extremity 04/02/2017  . Pure hypercholesterolemia 03/07/2017  . Snoring 03/13/2018  . Tobacco abuse 03/07/2017    Patient Active Problem List   Diagnosis Date Noted  . Stable angina ([place]) 07/11/2018  . Sleep apnea 03/13/2018  . Claudication in peripheral vascular disease ([place]) 04/02/2017  . Coronary  artery disease involving native coronary artery of native heart without angina pectoris 04/02/2017  . Paresthesia of lower extremity 04/02/2017  . Glucose intolerance (impaired glucose tolerance) 03/15/2017  . Essential hypertension 03/07/2017  . Pure hypercholesterolemia 03/07/2017  . Tobacco abuse 03/07/2017    Past Surgical History:  Procedure Laterality Date  . CARDIAC CATHETERIZATION    . cardiac stents    . CORONARY ANGIOPLASTY    . LEFT HEART CATH AND CORONARY ANGIOGRAPHY N/A 07/11/2018   Procedure: LEFT HEART CATH AND CORONARY ANGIOGRAPHY;  Surgeon: Nelva Bush, MD;  Location: [facility];  Service: Cardiovascular;  Laterality: N/A;        Home Medications    Prior to Admission medications   Medication Sig Start Date End Date Taking? Authorizing Provider  acetaminophen (TYLENOL) 500 MG tablet Take 2 tablets (1,000 mg total) by mouth 3 (three) times daily. 07/04/18  Yes Richardo Priest, MD  amLODipine (NORVASC) 10 MG tablet Take 1 tablet (10 mg total) by mouth daily. 06/28/18  Yes Gildardo Pounds, NP  atorvastatin (LIPITOR) 40 MG tablet Take 1 tablet (40 mg total) by mouth at bedtime. 06/28/18  Yes Gildardo Pounds, NP  buPROPion (WELLBUTRIN SR) 150 MG 12 hr tablet Take 1 tablet (150 mg total) by mouth 2 (two) times daily. 06/28/18  Yes Gildardo Pounds, NP  clopidogrel (PLAVIX) 75 MG tablet Take 1 tablet (75 mg total) by mouth daily. 06/28/18  Yes Raul Del,  Vernia Buff, NP  furosemide (LASIX) 40 MG tablet Take 1 tablet (40 mg total) by mouth daily. 07/11/18  Yes Cheryln Manly, NP  gabapentin (NEURONTIN) 400 MG capsule Take 2 capsules (800 mg total) by mouth 3 (three) times daily. 06/28/18  Yes Gildardo Pounds, NP  lidocaine (XYLOCAINE) 2 % jelly Apply 1 application topically as needed. Patient taking differently: Apply 1 application topically 2 (two) times daily.  05/05/18  Yes Recardo Evangelist, PA-C  losartan-hydrochlorothiazide (HYZAAR) 100-25 MG tablet Take  1 tablet by mouth daily. 06/28/18  Yes Gildardo Pounds, NP  metFORMIN (GLUCOPHAGE) 500 MG tablet Take 1 tablet (500 mg total) by mouth daily with breakfast. 07/14/18  Yes End, Harrell Gave, MD  metoprolol tartrate (LOPRESSOR) 25 MG tablet Take 0.5 tablets (12.5 mg total) by mouth 2 (two) times daily. 06/28/18 11/18/18 Yes Gildardo Pounds, NP  traZODone (DESYREL) 150 MG tablet Take 1 tablet (150 mg total) by mouth at bedtime. 06/28/18 11/18/18 Yes Gildardo Pounds, NP  albuterol (PROVENTIL HFA;VENTOLIN HFA) 108 (90 Base) MCG/ACT inhaler Inhale 1-2 puffs into the lungs every 6 (six) hours as needed for wheezing or shortness of breath. 11/18/18   Keeshawn Fakhouri E, PA-C  benzonatate (TESSALON) 100 MG capsule Take 1 capsule (100 mg total) by mouth 3 (three) times daily as needed for cough. 11/18/18   Samika Vetsch E, PA-C  Blood Glucose Monitoring Suppl (TRUE METRIX METER) w/Device KIT Use as instructed 07/04/18   Gildardo Pounds, NP  glucose blood (TRUE METRIX BLOOD GLUCOSE TEST) test strip Use as instructed 07/04/18   Gildardo Pounds, NP  ketoconazole (NIZORAL) 2 % cream Apply 1 application topically 2 (two) times daily. Apply to skin folds on abdomen 11/18/18   Oakley Orban E, PA-C  nitroGLYCERIN (NITROSTAT) 0.4 MG SL tablet Place 1 tablet (0.4 mg total) under the tongue every 5 (five) minutes as needed for chest pain. Patient not taking: Reported on 11/18/2018 07/04/18 10/02/18  Richardo Priest, MD  tiZANidine (ZANAFLEX) 4 MG tablet Take 1 tablet (4 mg total) by mouth every 6 (six) hours as needed for muscle spasms. Patient not taking: Reported on 11/18/2018 06/28/18   Gildardo Pounds, NP  TRUEPLUS LANCETS 28G MISC Use as instructed 07/04/18   Gildardo Pounds, NP    Family History Family History  Problem Relation Age of Onset  . Hypertension Mother   . Heart attack Mother   . Heart disease Mother   . Hypertension Father     Social History Social History   Tobacco Use  . Smoking status:  Current Every Day Smoker    Types: Cigarettes  . Smokeless tobacco: Former Systems developer    Types: Chew    Quit date: 2000  . Tobacco comment: 4 cigarettes   Substance Use Topics  . Alcohol use: Not Currently  . Drug use: Not Currently     Allergies   Asa [aspirin]; Lemon flavor; Other; Penicillins; Lisinopril; Clindamycin; and Erythromycin   Review of Systems Review of Systems  Constitutional: Positive for fever.  HENT: Positive for congestion. Negative for sinus pressure, sinus pain and sore throat.   Respiratory: Positive for cough and shortness of breath.   Skin: Positive for wound.  All other systems reviewed and are negative.    Physical Exam Updated Vital Signs BP 133/74   Pulse 95   Temp 98.1 F (36.7 C) (Oral)   Resp 18   Ht _0  (1.905 m)   Wt (!) 167.8 kg  SpO2 98%   BMI 46.25 kg/m   Physical Exam Vitals signs and nursing note reviewed.  Constitutional:      Appearance: He is well-developed. He is not toxic-appearing.  HENT:     Head: Normocephalic and atraumatic.     Right Ear: Tympanic membrane and external ear normal.     Left Ear: Tympanic membrane and external ear normal.     Nose: Congestion present.     Mouth/Throat:     Mouth: Mucous membranes are moist.     Pharynx: Oropharynx is clear. No oropharyngeal exudate or posterior oropharyngeal erythema.  Eyes:     General: No scleral icterus.       Right eye: No discharge.        Left eye: No discharge.     Extraocular Movements: Extraocular movements intact.     Conjunctiva/sclera: Conjunctivae normal.     Pupils: Pupils are equal, round, and reactive to light.  Neck:     Musculoskeletal: Normal range of motion. No muscular tenderness.  Cardiovascular:     Rate and Rhythm: Normal rate and regular rhythm.     Pulses: Normal pulses.          Radial pulses are 2+ on the right side and 2+ on the left side.     Heart sounds: Normal heart sounds.  Pulmonary:     Effort: Pulmonary effort is normal.       Breath sounds: Normal breath sounds. No wheezing, rhonchi or rales.     Comments: Patient is speaking in full sentences, without accessory muscle use. Abdominal:     General: There is no distension.     Palpations: Abdomen is soft.     Tenderness: There is no abdominal tenderness.  Musculoskeletal: Normal range of motion.     Right lower leg: No edema.     Left lower leg: No edema.  Skin:    General: Skin is warm and dry.     Capillary Refill: Capillary refill takes less than 2 seconds.     Comments: 4 x 5 cm patch of red skin in skin fold between abdomen and going. No drainage, or discharge. No open wound or laceration.  Neurological:     Mental Status: He is oriented to person, place, and time.     Comments: Fluent speech, no facial droop.  Psychiatric:        Behavior: Behavior normal.      ED Treatments / Results  Labs (all labs ordered are listed, but only abnormal results are displayed) Labs Reviewed - No data to display  EKG None  Radiology Dg Chest 2 View  Result Date: 11/18/2018 CLINICAL DATA:  38 year old male with intermittent fever cough and shortness of breath for 6 weeks. Smoker. EXAM: CHEST - 2 VIEW COMPARISON:  Chest radiographs 07/04/2018 and earlier. FINDINGS: Lung volumes and mediastinal contours remain normal. Visualized tracheal air column is within normal limits. Mild chronic increased pulmonary interstitial markings appear stable since 2019. No superimposed pneumothorax, pulmonary edema, pleural effusion or confluent pulmonary opacity. No acute osseous abnormality identified. Negative visible bowel gas pattern. IMPRESSION: Mild chronic increased interstitial markings which are probably smoking related. No acute cardiopulmonary abnormality. Electronically Signed   By: Genevie Ann M.D.   On: 11/18/2018 11:12    Procedures Procedures (including critical care time)  Medications Ordered in ED Medications  acetaminophen (TYLENOL) tablet 650 mg (650 mg Oral  Given 11/18/18 1242)  albuterol (PROVENTIL HFA;VENTOLIN HFA) 108 (90 Base) MCG/ACT inhaler 1-2  puff (2 puffs Inhalation Given 11/18/18 1242)     Initial Impression / Assessment and Plan / ED Course  I have reviewed the triage vital signs and the nursing notes.  Pertinent labs & imaging results that were available during my care of the patient were reviewed by me and considered in my medical decision making (see chart for details).    Patient presents with URI type symptoms.  Patient is nontoxic appearing, in no apparent distress. Patient is afebrile in the ED, lungs are CTA, CXR  negative for infiltrate, doubt pneumonia.  EKG is unchanged from prior.  There is no wheezing or signs of respiratory distress. Sxs onset < 7 days, afebrile, no sinus tenderness, doubt acute bacterial sinusitis. Centor score 0, doubt strep pharyngitis. No evidence of AOM on exam. No meningeal signs. No history components or rashes to raise concern for tic borne illness. Suspect viral vs allergic etiology at this time and will treat supportively with Tylenol, Flonase, and Tessalon.  Patient also has possible yeast infection in the skin fold between abdomen and pelvis.  Prescription given for ketoconazole cream. Since blood pressure is elevated today.  His wife reports he gets this way when he is in pain.  He reports compliance with blood pressure medications.  I recommend he have his blood pressure rechecked within the next week.  I discussed results, treatment plan, need for PCP follow-up, and return precautions with the patient.  Patient has appointment scheduled in 2 weeks with his primary care provider, I recommend he keep that appointment as scheduled for follow-up. I provided opportunity for questions, patient confirmed understanding and is in agreement with plan.        Final Clinical Impressions(s) / ED Diagnoses   Final diagnoses:  Viral URI with cough  Skin yeast infection    ED Discharge Orders          Ordered    ketoconazole (NIZORAL) 2 % cream     11/18/18 1231    albuterol (PROVENTIL HFA;VENTOLIN HFA) 108 (90 Base) MCG/ACT inhaler  Every 6 hours PRN     11/18/18 1231    benzonatate (TESSALON) 100 MG capsule  3 times daily PRN     11/18/18 1247           Jefrey Raburn, Harley Hallmark, PA-C 11/18/18 Bells, [place], DO 11/23/18 1841

## 2018-11-18 NOTE — Discharge Instructions (Signed)
You have been seen today for cough, fever, skin irritation. Please read and follow all provided instructions. Return to the emergency room for worsening condition or new concerning symptoms.    1. Medications:  Prescriptions sent to your pharmacy for refill of your albuterol inhaler, Tessalon Perles, and ketoconazole cream. Tessalon Perles as a cough medicine.  You can take this every 8 hours as needed for your cough. Ketoconazole cream is for possible yeast infection on your skin.  Please use as directed, applying to the skin 2 times per day. You can take Tylenol for pain or fever.  Please do not take more than directed. Continue usual home medications. Take medications as prescribed. Please review all of the medicines and only take them if you do not have an allergy to them.  2. Treatment: rest, drink plenty of fluids 3. Follow Up: Keep your doctor appointment as scheduled for follow-up.  You should also have your blood pressure rechecked at that visit.   It is also a possibility that you have an allergic reaction to any of the medicines that you have been prescribed - Everybody reacts differently to medications and while MOST people have no trouble with most medicines, you may have a reaction such as nausea, vomiting, rash, swelling, shortness of breath. If this is the case, please stop taking the medicine immediately and contact your physician.  ?

## 2018-11-18 NOTE — ED Triage Notes (Addendum)
Pt verbalizes ongoing cough/sob and fever for 6 weeks; "prone to upper respiratory infections and my primary care can't see me until Friday." Pt denies chest pain but verbalizes soreness from coughing.

## 2018-12-18 ENCOUNTER — Inpatient Hospital Stay: Payer: Self-pay | Admitting: Nurse Practitioner

## 2018-12-25 ENCOUNTER — Ambulatory Visit: Payer: Self-pay | Attending: Nurse Practitioner | Admitting: Primary Care

## 2018-12-25 ENCOUNTER — Encounter: Payer: Self-pay | Admitting: Primary Care

## 2018-12-25 ENCOUNTER — Inpatient Hospital Stay: Payer: Self-pay

## 2018-12-25 ENCOUNTER — Other Ambulatory Visit: Payer: Self-pay

## 2018-12-25 VITALS — BP 128/92 | HR 105 | Temp 99.3°F | Resp 16 | Ht 75.0 in | Wt 375.2 lb

## 2018-12-25 DIAGNOSIS — Z6841 Body Mass Index (BMI) 40.0 and over, adult: Secondary | ICD-10-CM

## 2018-12-25 DIAGNOSIS — K649 Unspecified hemorrhoids: Secondary | ICD-10-CM

## 2018-12-25 DIAGNOSIS — G43011 Migraine without aura, intractable, with status migrainosus: Secondary | ICD-10-CM

## 2018-12-25 DIAGNOSIS — I10 Essential (primary) hypertension: Secondary | ICD-10-CM

## 2018-12-25 MED ORDER — SUMATRIPTAN SUCCINATE 100 MG PO TABS: 100.0000 mg | ORAL_TABLET | ORAL | 2 refills | Status: DC | PRN

## 2018-12-25 MED ORDER — LIDOCAINE HCL 2 % EX GEL: 1.0000 "application " | Freq: Two times a day (BID) | CUTANEOUS | 1 refills | Status: AC

## 2018-12-25 NOTE — Progress Notes (Signed)
Established Patient Office Visit  Subjective:  Patient ID: Jeffrey Lanz., male    DOB: June 02, 1981  Age: 38 y.o. MRN: [id number]  CC: No chief complaint on file.  Hypertension  This is a chronic problem. The current episode started more than 1 year ago. The problem has been gradually worsening since onset. The problem is uncontrolled. Associated symptoms include headaches and shortness of breath. Risk factors for coronary artery disease include diabetes mellitus, dyslipidemia, male gender, obesity and sedentary lifestyle. Past treatments include calcium channel blockers and diuretics. Improvement on treatment: out of meds. Hypertensive end-organ damage includes CAD/MI and CVA. Identifiable causes of hypertension include sleep apnea.   HPI Jeffrey Barrera. presents for medication refills.  Past Medical History:  Diagnosis Date  . Arthritis   . Asthma   . Claudication ([place]) 04/02/2017  . Coronary artery disease involving native coronary artery of native heart without angina pectoris 04/02/2017  . Essential hypertension 03/07/2017  . Glucose intolerance (impaired glucose tolerance) 03/15/2017  . Hypertension   . Myocardial infarction ([place])   . Paresthesia of lower extremity 04/02/2017  . Pure hypercholesterolemia 03/07/2017  . Snoring 03/13/2018  . Tobacco abuse 03/07/2017    Past Surgical History:  Procedure Laterality Date  . CARDIAC CATHETERIZATION    . cardiac stents    . CORONARY ANGIOPLASTY    . LEFT HEART CATH AND CORONARY ANGIOGRAPHY N/A 07/11/2018   Procedure: LEFT HEART CATH AND CORONARY ANGIOGRAPHY;  Surgeon: Nelva Bush, MD;  Location: [facility];  Service: Cardiovascular;  Laterality: N/A;    Family History  Problem Relation Age of Onset  . Hypertension Mother   . Heart attack Mother   . Heart disease Mother   . Hypertension Father     Social History   Socioeconomic History  . Marital status: Married    Spouse name: Not on file  . Number of  children: Not on file  . Years of education: Not on file  . Highest education level: Not on file  Occupational History  . Not on file  Social Needs  . Financial resource strain: Not on file  . Food insecurity:    Worry: Not on file    Inability: Not on file  . Transportation needs:    Medical: Not on file    Non-medical: Not on file  Tobacco Use  . Smoking status: Current Every Day Smoker    Types: Cigarettes  . Smokeless tobacco: Former Systems developer    Types: Chew    Quit date: 2000  . Tobacco comment: 4 cigarettes   Substance and Sexual Activity  . Alcohol use: Not Currently  . Drug use: Not Currently  . Sexual activity: Yes  Lifestyle  . Physical activity:    Days per week: Not on file    Minutes per session: Not on file  . Stress: Not on file  Relationships  . Social connections:    Talks on phone: Not on file    Gets together: Not on file    Attends religious service: Not on file    Active member of club or organization: Not on file    Attends meetings of clubs or organizations: Not on file    Relationship status: Not on file  . Intimate partner violence:    Fear of current or ex partner: Not on file    Emotionally abused: Not on file    Physically abused: Not on file    Forced sexual activity: Not on file  Other Topics Concern  . Not on file  Social History Narrative  . Not on file    Outpatient Medications Prior to Visit  Medication Sig Dispense Refill  . acetaminophen (TYLENOL) 500 MG tablet Take 2 tablets (1,000 mg total) by mouth 3 (three) times daily. 30 tablet 0  . albuterol (PROVENTIL HFA;VENTOLIN HFA) 108 (90 Base) MCG/ACT inhaler Inhale 1-2 puffs into the lungs every 6 (six) hours as needed for wheezing or shortness of breath. 1 Inhaler 0  . amLODipine (NORVASC) 10 MG tablet Take 1 tablet (10 mg total) by mouth daily. 90 tablet 1  . atorvastatin (LIPITOR) 40 MG tablet Take 1 tablet (40 mg total) by mouth at bedtime. 90 tablet 1  . benzonatate (TESSALON)  100 MG capsule Take 1 capsule (100 mg total) by mouth 3 (three) times daily as needed for cough. 21 capsule 0  . Blood Glucose Monitoring Suppl (TRUE METRIX METER) w/Device KIT Use as instructed 1 kit 0  . buPROPion (WELLBUTRIN SR) 150 MG 12 hr tablet Take 1 tablet (150 mg total) by mouth 2 (two) times daily. 60 tablet 3  . clopidogrel (PLAVIX) 75 MG tablet Take 1 tablet (75 mg total) by mouth daily. 90 tablet 2  . furosemide (LASIX) 40 MG tablet Take 1 tablet (40 mg total) by mouth daily. 30 tablet 5  . gabapentin (NEURONTIN) 400 MG capsule Take 2 capsules (800 mg total) by mouth 3 (three) times daily. 90 capsule 3  . glucose blood (TRUE METRIX BLOOD GLUCOSE TEST) test strip Use as instructed 100 each 12  . ketoconazole (NIZORAL) 2 % cream Apply 1 application topically 2 (two) times daily. Apply to skin folds on abdomen 15 g 0  . lidocaine (XYLOCAINE) 2 % jelly Apply 1 application topically as needed. (Patient taking differently: Apply 1 application topically 2 (two) times daily. ) 30 mL 0  . losartan-hydrochlorothiazide (HYZAAR) 100-25 MG tablet Take 1 tablet by mouth daily. 90 tablet 3  . metFORMIN (GLUCOPHAGE) 500 MG tablet Take 1 tablet (500 mg total) by mouth daily with breakfast. 30 tablet 1  . metoprolol tartrate (LOPRESSOR) 25 MG tablet Take 0.5 tablets (12.5 mg total) by mouth 2 (two) times daily. 60 tablet 3  . nitroGLYCERIN (NITROSTAT) 0.4 MG SL tablet Place 1 tablet (0.4 mg total) under the tongue every 5 (five) minutes as needed for chest pain. (Patient not taking: Reported on 11/18/2018) 25 tablet 11  . tiZANidine (ZANAFLEX) 4 MG tablet Take 1 tablet (4 mg total) by mouth every 6 (six) hours as needed for muscle spasms. (Patient not taking: Reported on 11/18/2018) 30 tablet 1  . traZODone (DESYREL) 150 MG tablet Take 1 tablet (150 mg total) by mouth at bedtime. 90 tablet 2  . TRUEPLUS LANCETS 28G MISC Use as instructed 100 each 3   No facility-administered medications prior to visit.      Allergies  Allergen Reactions  . Asa [Aspirin] Anaphylaxis  . Lemon Flavor Anaphylaxis    Throat swell up.   . Other Anaphylaxis and Swelling    Pickle  . Penicillins Anaphylaxis and Rash    Has patient had a PCN reaction causing immediate rash, facial/tongue/throat swelling, SOB or lightheadedness with hypotension: Yes Has patient had a PCN reaction causing severe rash involving mucus membranes or skin necrosis: No Has patient had a PCN reaction that required hospitalization: reaction occurred in the hospital Has patient had a PCN reaction occurring within the last 10 years: Yes If all of the above answers  are "NO", then may proceed with Cephalosporin use.  Marland Kitchen Lisinopril     Kidney failure  . Clindamycin Rash  . Erythromycin Rash    ROS Review of Systems  Constitutional: Negative.   HENT: Positive for congestion, postnasal drip and sinus pain.   Eyes: Positive for visual disturbance.  Respiratory: Positive for shortness of breath.   Cardiovascular: Positive for leg swelling.  Gastrointestinal: Positive for abdominal distention and anal bleeding.       Hemorrhoids   Endocrine: Negative.   Genitourinary: Negative.   Musculoskeletal: Positive for back pain.  Skin: Negative.   Allergic/Immunologic:       Seasonal  Neurological: Positive for headaches.  Hematological: Negative.   Psychiatric/Behavioral: Positive for decreased concentration and sleep disturbance.      Objective:    Physical Exam  Constitutional: He appears well-developed and well-nourished.  HENT:  Head: Normocephalic and atraumatic.  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 Neck supple.  Cardiovascular: Normal rate and regular rhythm.  Pulmonary/Chest: He has wheezes.  Abdominal: Soft. Bowel sounds are normal. He exhibits distension.  Musculoskeletal: Normal range of motion.  Neurological: He is alert.  Skin: Skin is warm.  Psychiatric: He has a normal mood and affect.    There  were no vitals taken for this visit. Wt Readings from Last 3 Encounters:  11/18/18 (!) 370 lb (167.8 kg)  07/14/18 (!) 380 lb (172.4 kg)  07/11/18 (!) 380 lb (172.4 kg)     Health Maintenance Due  Topic Date Due  . HIV Screening  05/18/1994  . TETANUS/TDAP  05/18/1998    There are no preventive care reminders to display for this patient.  No results found for: TSH Lab Results  Component Value Date   WBC 11.1 (H) 07/04/2018   HGB 14.3 07/04/2018   HCT 42.2 07/04/2018   MCV 85 07/04/2018   PLT 345 07/04/2018   Lab Results  Component Value Date   NA 140 07/04/2018   K 4.2 07/04/2018   CO2 24 07/04/2018   GLUCOSE 212 (H) 07/04/2018   BUN 9 07/04/2018   CREATININE 0.87 07/04/2018   BILITOT 0.3 06/28/2018   ALKPHOS 89 06/28/2018   AST 31 06/28/2018   ALT 49 (H) 06/28/2018   PROT 7.0 06/28/2018   ALBUMIN 4.2 06/28/2018   CALCIUM 9.5 07/04/2018   ANIONGAP 10 05/05/2018   Lab Results  Component Value Date   CHOL 178 06/28/2018   Lab Results  Component Value Date   HDL 31 (L) 06/28/2018   Lab Results  Component Value Date   LDLCALC 105 (H) 06/28/2018   Lab Results  Component Value Date   TRIG 209 (H) 06/28/2018   Lab Results  Component Value Date   CHOLHDL 5.7 (H) 06/28/2018   No results found for: HGBA1C    Assessment & Plan:   Problem List Items Addressed This Visit    None      Finley was seen today for hospitalization follow-up.  Diagnoses and all orders for this visit:  Essential hypertension Out of Bp medications he will pick them up after this visit.  Call Bp reading in to PCP in 2 weeks  Hemorrhoids, unspecified hemorrhoid type Internal and external sent in lidocaine jelly   Intractable migraine without aura and with status migrainosus Started on 178m of  Imitrex if able break in half if not take no more than 2 daily at 1075m  Morbid obesity with BMI of 50.0-59.9, adult [place]  exercise runs 2 miles daily   Follow-up: No follow-ups  on file.    Kerin Perna, NP

## 2018-12-25 NOTE — Patient Instructions (Signed)

## 2019-03-24 ENCOUNTER — Emergency Department (HOSPITAL_COMMUNITY): Payer: Self-pay

## 2019-03-24 ENCOUNTER — Other Ambulatory Visit: Payer: Self-pay

## 2019-03-24 ENCOUNTER — Emergency Department (HOSPITAL_COMMUNITY)
Admission: EM | Admit: 2019-03-24 | Discharge: 2019-03-24 | Disposition: A | Discharge status: Home / Self Care | Payer: Self-pay | Attending: Emergency Medicine | Admitting: Emergency Medicine

## 2019-03-24 DIAGNOSIS — Z7902 Long term (current) use of antithrombotics/antiplatelets: Secondary | ICD-10-CM | POA: Insufficient documentation

## 2019-03-24 DIAGNOSIS — F17210 Nicotine dependence, cigarettes, uncomplicated: Secondary | ICD-10-CM | POA: Insufficient documentation

## 2019-03-24 DIAGNOSIS — R070 Pain in throat: Secondary | ICD-10-CM | POA: Insufficient documentation

## 2019-03-24 DIAGNOSIS — R079 Chest pain, unspecified: Secondary | ICD-10-CM | POA: Insufficient documentation

## 2019-03-24 DIAGNOSIS — J45909 Unspecified asthma, uncomplicated: Secondary | ICD-10-CM | POA: Insufficient documentation

## 2019-03-24 DIAGNOSIS — Z7984 Long term (current) use of oral hypoglycemic drugs: Secondary | ICD-10-CM | POA: Insufficient documentation

## 2019-03-24 DIAGNOSIS — I10 Essential (primary) hypertension: Secondary | ICD-10-CM | POA: Insufficient documentation

## 2019-03-24 DIAGNOSIS — Z77098 Contact with and (suspected) exposure to other hazardous, chiefly nonmedicinal, chemicals: Secondary | ICD-10-CM | POA: Insufficient documentation

## 2019-03-24 DIAGNOSIS — R0602 Shortness of breath: Secondary | ICD-10-CM | POA: Insufficient documentation

## 2019-03-24 DIAGNOSIS — I2510 Atherosclerotic heart disease of native coronary artery without angina pectoris: Secondary | ICD-10-CM | POA: Insufficient documentation

## 2019-03-24 DIAGNOSIS — Z79899 Other long term (current) drug therapy: Secondary | ICD-10-CM | POA: Insufficient documentation

## 2019-03-24 IMAGING — CR CHEST - 2 VIEW
2 series · 2 of 2 positions shown · non-contrast
Comparison: [DATE]

CLINICAL DATA: Chest tightness status post bleach exposure

EXAM:
CHEST - 2 VIEW

[chest pa]
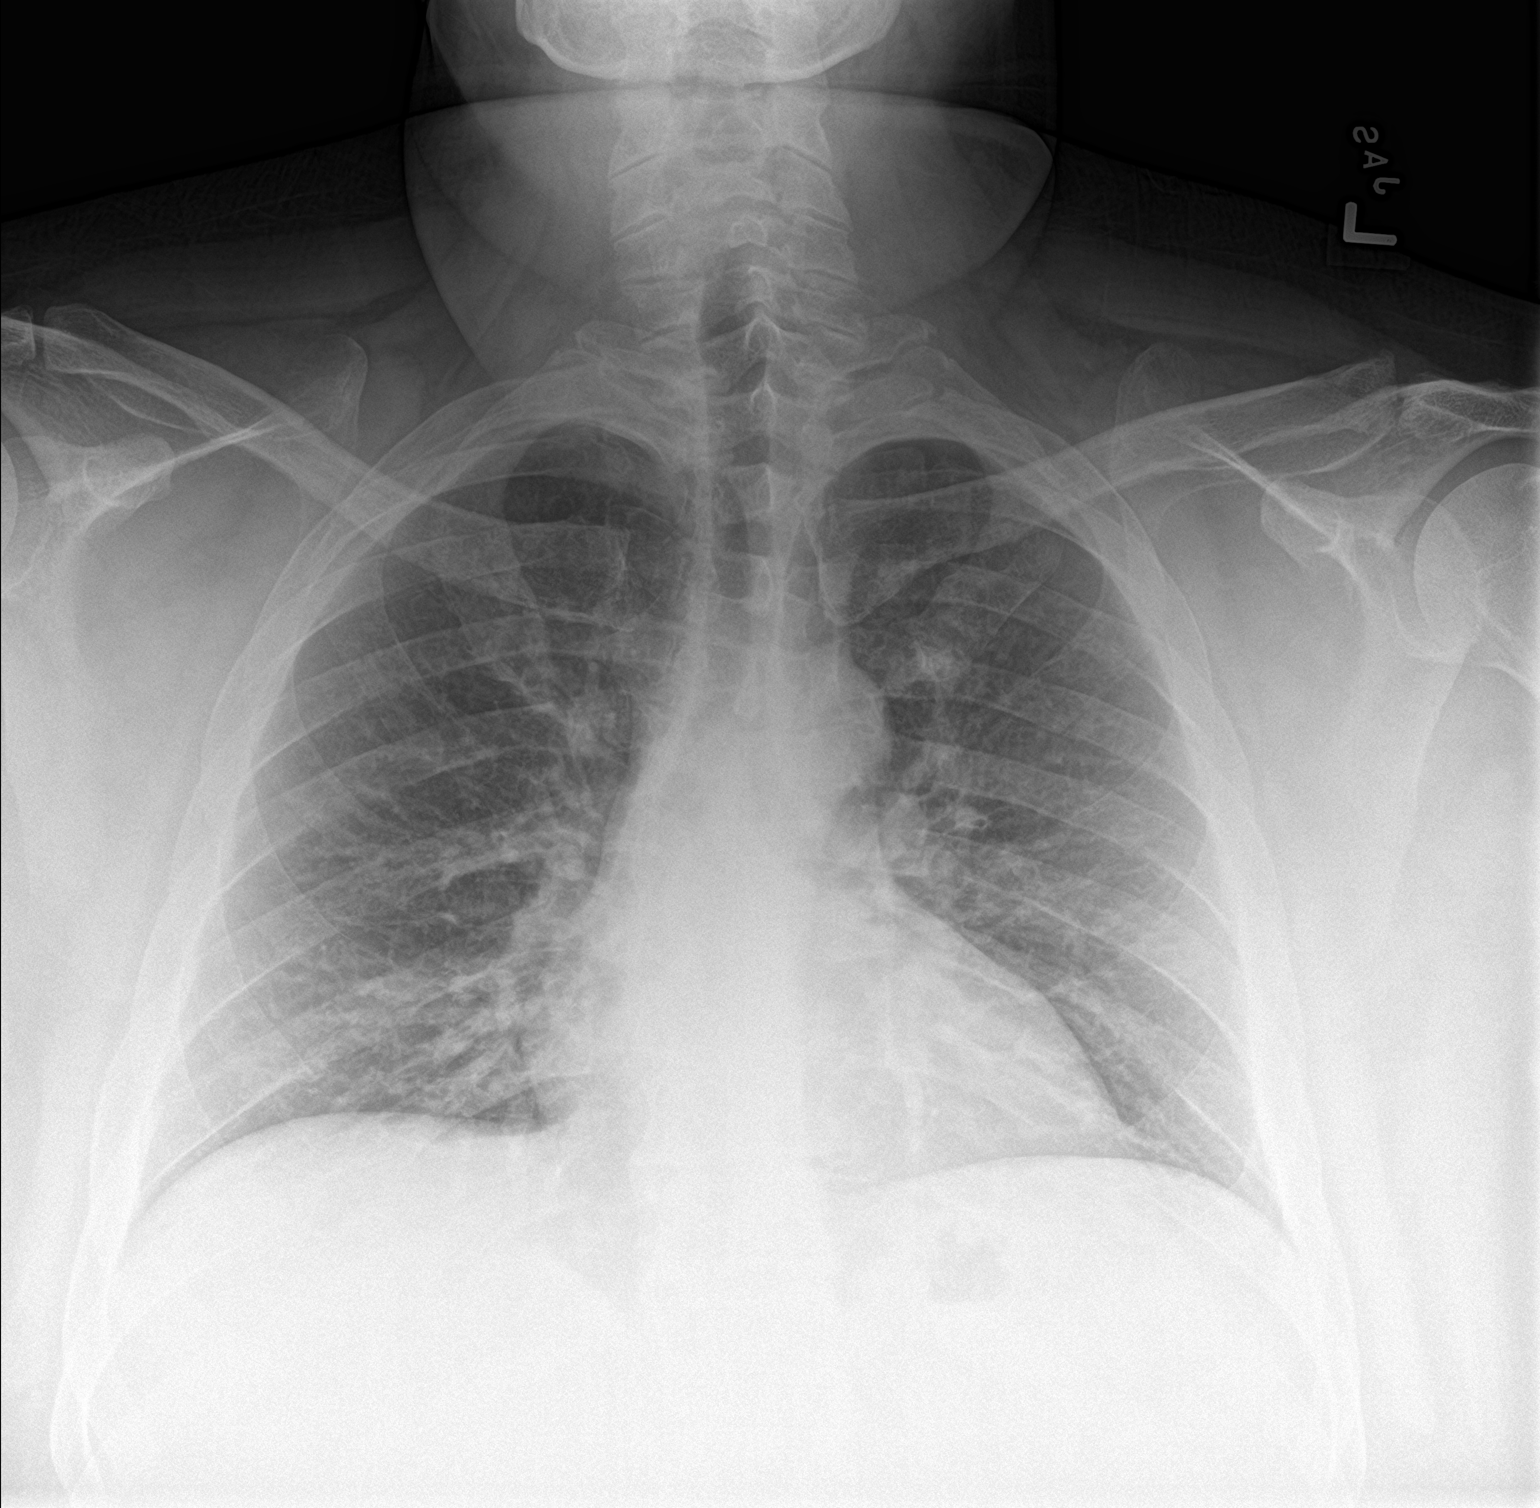

[chest lat]
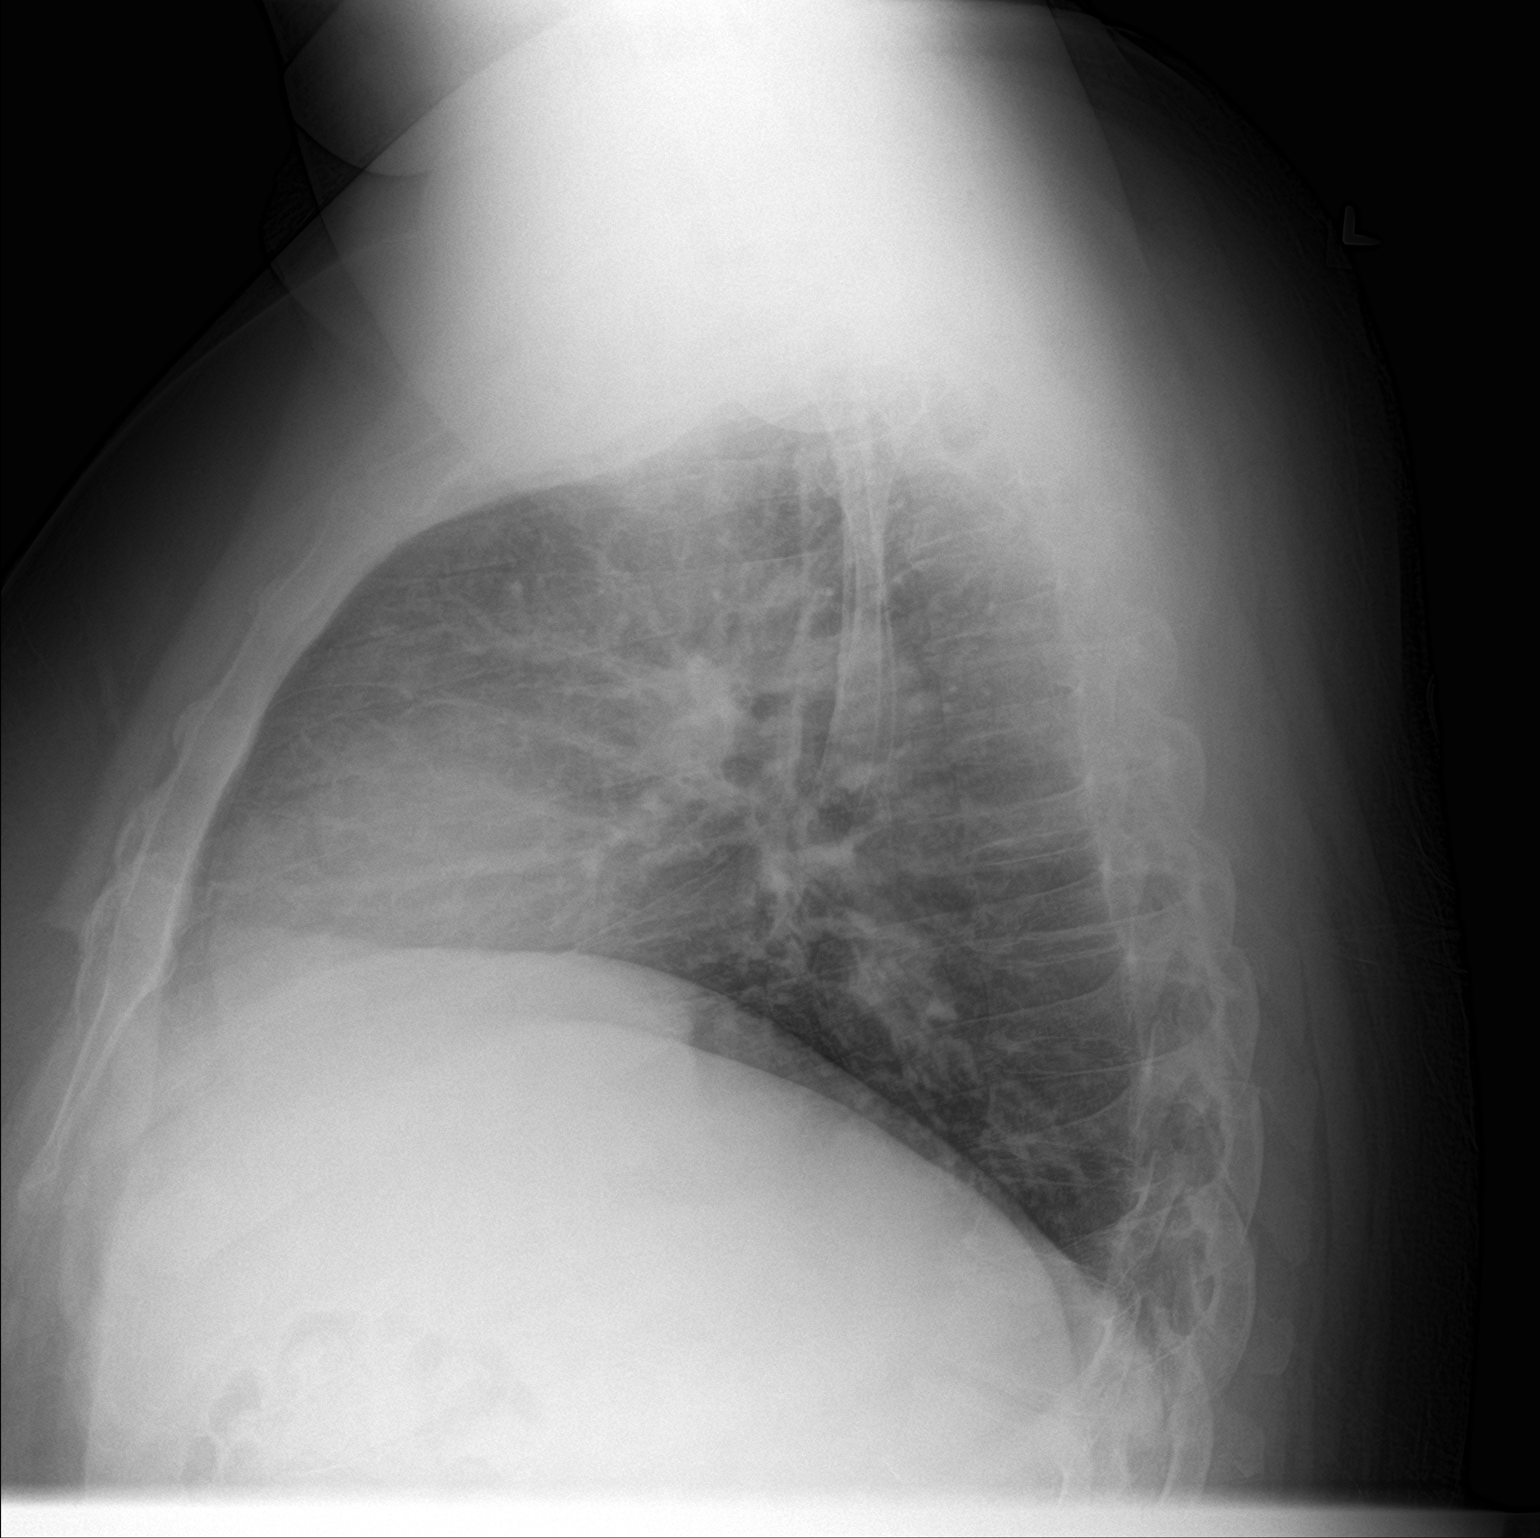

[2 of 2 positions shown; findings below may reference images not displayed]

FINDINGS: Again noted are chronic interstitial lung markings bilaterally. No
pneumothorax. No large pleural effusion. There may be some
atelectasis at the right lung base. There is no acute osseous
abnormality.
IMPRESSION: 1. No definite acute cardiopulmonary process.
2. Again identified are chronic interstitial lung markings
bilaterally similar to prior studies.

## 2019-03-24 MED ORDER — LIDOCAINE VISCOUS HCL 2 % MT SOLN
15.0000 mL | Freq: Once | OROMUCOSAL | Status: AC
  Administered 2019-03-24: 15 mL via OROMUCOSAL
  Filled 2019-03-24: qty 15

## 2019-03-24 MED ORDER — ALBUTEROL SULFATE HFA 108 (90 BASE) MCG/ACT IN AERS
2.0000 | INHALATION_SPRAY | RESPIRATORY_TRACT | Status: DC
  Administered 2019-03-24: 2 via RESPIRATORY_TRACT
  Filled 2019-03-24: qty 6.7
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
## 2019-03-24 NOTE — ED Notes (Signed)
Patient left the ER without notifying staff.

## 2019-03-26 ENCOUNTER — Other Ambulatory Visit: Payer: Self-pay

## 2019-03-26 ENCOUNTER — Encounter: Payer: Self-pay | Admitting: Nurse Practitioner

## 2019-03-26 ENCOUNTER — Ambulatory Visit: Payer: Self-pay | Attending: Nurse Practitioner | Admitting: Nurse Practitioner

## 2019-03-26 DIAGNOSIS — F32A Depression, unspecified: Secondary | ICD-10-CM

## 2019-03-26 DIAGNOSIS — M5440 Lumbago with sciatica, unspecified side: Secondary | ICD-10-CM

## 2019-03-26 DIAGNOSIS — G629 Polyneuropathy, unspecified: Secondary | ICD-10-CM

## 2019-03-26 DIAGNOSIS — J45909 Unspecified asthma, uncomplicated: Secondary | ICD-10-CM | POA: Insufficient documentation

## 2019-03-26 DIAGNOSIS — F419 Anxiety disorder, unspecified: Secondary | ICD-10-CM

## 2019-03-26 DIAGNOSIS — G43011 Migraine without aura, intractable, with status migrainosus: Secondary | ICD-10-CM

## 2019-03-26 DIAGNOSIS — F329 Major depressive disorder, single episode, unspecified: Secondary | ICD-10-CM

## 2019-03-26 DIAGNOSIS — I10 Essential (primary) hypertension: Secondary | ICD-10-CM

## 2019-03-26 DIAGNOSIS — J029 Acute pharyngitis, unspecified: Secondary | ICD-10-CM

## 2019-03-26 DIAGNOSIS — G8929 Other chronic pain: Secondary | ICD-10-CM

## 2019-03-26 DIAGNOSIS — I252 Old myocardial infarction: Secondary | ICD-10-CM

## 2019-03-26 DIAGNOSIS — E7800 Pure hypercholesterolemia, unspecified: Secondary | ICD-10-CM

## 2019-03-26 DIAGNOSIS — F5101 Primary insomnia: Secondary | ICD-10-CM

## 2019-03-26 DIAGNOSIS — J4520 Mild intermittent asthma, uncomplicated: Secondary | ICD-10-CM

## 2019-03-26 MED ORDER — LIDOCAINE VISCOUS HCL 2 % MT SOLN: 15.0000 mL | OROMUCOSAL | 1 refills | Status: DC | PRN

## 2019-03-26 MED ORDER — GABAPENTIN 400 MG PO CAPS: 800.0000 mg | ORAL_CAPSULE | Freq: Three times a day (TID) | ORAL | 3 refills | Status: DC

## 2019-03-26 MED ORDER — ALBUTEROL SULFATE HFA 108 (90 BASE) MCG/ACT IN AERS: 1.0000 | INHALATION_SPRAY | Freq: Four times a day (QID) | RESPIRATORY_TRACT | 1 refills | Status: DC | PRN

## 2019-03-26 MED ORDER — SUMATRIPTAN SUCCINATE 100 MG PO TABS: 100.0000 mg | ORAL_TABLET | ORAL | 2 refills | Status: DC | PRN

## 2019-03-26 MED ORDER — TRAZODONE HCL 150 MG PO TABS: 150.0000 mg | ORAL_TABLET | Freq: Every day | ORAL | 2 refills | Status: DC

## 2019-03-26 MED ORDER — BUSPIRONE HCL 15 MG PO TABS: 15.0000 mg | ORAL_TABLET | Freq: Three times a day (TID) | ORAL | 1 refills | Status: AC

## 2019-03-26 MED ORDER — BUPROPION HCL ER (SR) 200 MG PO TB12: 200.0000 mg | ORAL_TABLET | Freq: Two times a day (BID) | ORAL | 3 refills | Status: DC

## 2019-03-26 MED ORDER — METOPROLOL TARTRATE 25 MG PO TABS: 12.5000 mg | ORAL_TABLET | Freq: Two times a day (BID) | ORAL | 3 refills | Status: DC

## 2019-03-26 MED ORDER — LOSARTAN POTASSIUM-HCTZ 100-25 MG PO TABS: 1.0000 | ORAL_TABLET | Freq: Every day | ORAL | 3 refills | Status: DC

## 2019-03-26 MED ORDER — METFORMIN HCL 500 MG PO TABS: 500.0000 mg | ORAL_TABLET | Freq: Every day | ORAL | 2 refills | Status: DC

## 2019-03-26 MED ORDER — CLOPIDOGREL BISULFATE 75 MG PO TABS: 75.0000 mg | ORAL_TABLET | Freq: Every day | ORAL | 2 refills | Status: DC

## 2019-03-26 MED ORDER — ATORVASTATIN CALCIUM 40 MG PO TABS: 40.0000 mg | ORAL_TABLET | Freq: Every day | ORAL | 1 refills | Status: DC

## 2019-03-26 MED ORDER — TIZANIDINE HCL 4 MG PO TABS: 4.0000 mg | ORAL_TABLET | Freq: Four times a day (QID) | ORAL | 1 refills | Status: DC | PRN

## 2019-03-26 MED ORDER — AMLODIPINE BESYLATE 10 MG PO TABS: 10.0000 mg | ORAL_TABLET | Freq: Every day | ORAL | 1 refills | Status: DC
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
## 2019-04-14 ENCOUNTER — Other Ambulatory Visit: Payer: Self-pay

## 2019-09-02 ENCOUNTER — Ambulatory Visit: Payer: Self-pay | Attending: Nurse Practitioner | Admitting: Nurse Practitioner

## 2019-09-02 ENCOUNTER — Encounter: Payer: Self-pay | Admitting: Nurse Practitioner

## 2019-09-02 ENCOUNTER — Other Ambulatory Visit: Payer: Self-pay

## 2019-09-02 DIAGNOSIS — E7800 Pure hypercholesterolemia, unspecified: Secondary | ICD-10-CM

## 2019-09-02 DIAGNOSIS — M5440 Lumbago with sciatica, unspecified side: Secondary | ICD-10-CM

## 2019-09-02 DIAGNOSIS — K047 Periapical abscess without sinus: Secondary | ICD-10-CM

## 2019-09-02 DIAGNOSIS — G629 Polyneuropathy, unspecified: Secondary | ICD-10-CM

## 2019-09-02 DIAGNOSIS — I252 Old myocardial infarction: Secondary | ICD-10-CM

## 2019-09-02 DIAGNOSIS — Z114 Encounter for screening for human immunodeficiency virus [HIV]: Secondary | ICD-10-CM

## 2019-09-02 DIAGNOSIS — I10 Essential (primary) hypertension: Secondary | ICD-10-CM

## 2019-09-02 DIAGNOSIS — F17200 Nicotine dependence, unspecified, uncomplicated: Secondary | ICD-10-CM

## 2019-09-02 DIAGNOSIS — G8929 Other chronic pain: Secondary | ICD-10-CM

## 2019-09-02 MED ORDER — CHLORHEXIDINE GLUCONATE 0.12 % MT SOLN: 15.0000 mL | Freq: Two times a day (BID) | OROMUCOSAL | 1 refills | Status: DC

## 2019-09-02 MED ORDER — GABAPENTIN 400 MG PO CAPS: 800.0000 mg | ORAL_CAPSULE | Freq: Three times a day (TID) | ORAL | 3 refills | Status: DC

## 2019-09-02 MED ORDER — AMLODIPINE BESYLATE 10 MG PO TABS: 10.0000 mg | ORAL_TABLET | Freq: Every day | ORAL | 1 refills | Status: DC

## 2019-09-02 MED ORDER — METRONIDAZOLE 500 MG PO TABS: 500.0000 mg | ORAL_TABLET | Freq: Three times a day (TID) | ORAL | 0 refills | Status: DC

## 2019-09-02 MED ORDER — LEVOFLOXACIN 750 MG PO TABS: 750.0000 mg | ORAL_TABLET | Freq: Every day | ORAL | 0 refills | Status: DC

## 2019-09-02 MED ORDER — METOPROLOL TARTRATE 25 MG PO TABS: 12.5000 mg | ORAL_TABLET | Freq: Two times a day (BID) | ORAL | 3 refills | Status: DC

## 2019-09-02 MED ORDER — LOSARTAN POTASSIUM-HCTZ 100-25 MG PO TABS: 1.0000 | ORAL_TABLET | Freq: Every day | ORAL | 3 refills | Status: DC

## 2019-09-02 MED ORDER — TIZANIDINE HCL 4 MG PO TABS: 4.0000 mg | ORAL_TABLET | Freq: Four times a day (QID) | ORAL | 1 refills | Status: DC | PRN

## 2019-09-02 MED ORDER — ATORVASTATIN CALCIUM 40 MG PO TABS: 40.0000 mg | ORAL_TABLET | Freq: Every day | ORAL | 1 refills | Status: DC

## 2019-09-02 MED ORDER — CLOPIDOGREL BISULFATE 75 MG PO TABS: 75.0000 mg | ORAL_TABLET | Freq: Every day | ORAL | 0 refills | Status: DC

## 2019-09-02 NOTE — Progress Notes (Signed)
Virtual Visit via Telephone Note Due to national recommendations of social distancing due to [place] 19, telehealth visit is felt to be most appropriate for this patient at this time.  I discussed the limitations, risks, security and privacy concerns of performing an evaluation and management service by telephone and the availability of in person appointments. I also discussed with the patient that there may be a patient responsible charge related to this service. The patient expressed understanding and agreed to proceed.    I connected with Jeffrey Barrera. on 09/02/19  at   2:50 PM EST  EDT by telephone and verified that I am speaking with the correct person using two identifiers.   Consent I discussed the limitations, risks, security and privacy concerns of performing an evaluation and management service by telephone and the availability of in person appointments. I also discussed with the patient that there may be a patient responsible charge related to this service. The patient expressed understanding and agreed to proceed.   Location of Patient: Private Residence   Location of Provider: [place] participating in Telemedicine visit: [name] FNP-BC [facility].    History of Present Illness: Telemedicine visit for:  TT:SVXBL Abscess  Abscess: Patient presents for evaluation of a possible dental abscess. He has not seen a dentist.. Symptoms have progressed to a point and plateaued. Abscess has associated symptoms of swelling and pain in the soft tissue of the mouth. Patient does not have previous history of cutaneous abscesses. Patient does have diabetes.  Essential Hypertension Endorses medication compliance taking losartan-hctz 100-25 mg daily, amlodipine 10 mg daily. Denies chest pain, shortness of breath, palpitations, lightheadedness, dizziness, headaches or BLE edema. He has lasix for intermittent BLE edema.  BP  Readings from Last 3 Encounters:  03/24/19 137/85  12/25/18 (!) 128/92  11/18/18 133/74    Past Medical History:  Diagnosis Date  . Arthritis   . Asthma   . Claudication ([place]) 04/02/2017  . Coronary artery disease involving native coronary artery of native heart without angina pectoris 04/02/2017  . Essential hypertension 03/07/2017  . Glucose intolerance (impaired glucose tolerance) 03/15/2017  . Hypertension   . Myocardial infarction ([place])   . Paresthesia of lower extremity 04/02/2017  . Pure hypercholesterolemia 03/07/2017  . Snoring 03/13/2018  . Tobacco abuse 03/07/2017    Past Surgical History:  Procedure Laterality Date  . CARDIAC CATHETERIZATION    . cardiac stents    . CORONARY ANGIOPLASTY    . LEFT HEART CATH AND CORONARY ANGIOGRAPHY N/A 07/11/2018   Procedure: LEFT HEART CATH AND CORONARY ANGIOGRAPHY;  Surgeon: Nelva Bush, MD;  Location: [facility];  Service: Cardiovascular;  Laterality: N/A;    Family History  Problem Relation Age of Onset  . Hypertension Mother   . Heart attack Mother   . Heart disease Mother   . Hypertension Father     Social History   Socioeconomic History  . Marital status: Married    Spouse name: Not on file  . Number of children: Not on file  . Years of education: Not on file  . Highest education level: Not on file  Occupational History  . Not on file  Tobacco Use  . Smoking status: Current Every Day Smoker    Types: Cigarettes  . Smokeless tobacco: Former Systems developer    Types: Chew    Quit date: 2000  . Tobacco comment: 4 cigarettes   Substance and  Sexual Activity  . Alcohol use: Not Currently  . Drug use: Not Currently  . Sexual activity: Yes  Other Topics Concern  . Not on file  Social History Narrative  . Not on file   Social Determinants of Health   Financial Resource Strain:   . Difficulty of Paying Living Expenses: Not on file  Food Insecurity:   . Worried About Charity fundraiser in the Last Year: Not on  file  . Ran Out of Food in the Last Year: Not on file  Transportation Needs:   . Lack of Transportation (Medical): Not on file  . Lack of Transportation (Non-Medical): Not on file  Physical Activity:   . Days of Exercise per Week: Not on file  . Minutes of Exercise per Session: Not on file  Stress:   . Feeling of Stress : Not on file  Social Connections:   . Frequency of Communication with Friends and Family: Not on file  . Frequency of Social Gatherings with Friends and Family: Not on file  . Attends Religious Services: Not on file  . Active Member of Clubs or Organizations: Not on file  . Attends Archivist Meetings: Not on file  . Marital Status: Not on file     Observations/Objective: Awake, alert and oriented x 3   Review of Systems  Constitutional: Negative for fever, malaise/fatigue and weight loss.  HENT: Negative for nosebleeds.        SEE HPI  Eyes: Negative.  Negative for blurred vision, double vision and photophobia.  Respiratory: Negative.  Negative for cough and shortness of breath.   Cardiovascular: Negative.  Negative for chest pain, palpitations and leg swelling.  Gastrointestinal: Negative.  Negative for heartburn, nausea and vomiting.  Musculoskeletal: Positive for back pain and myalgias.  Neurological: Positive for sensory change. Negative for dizziness, focal weakness, seizures and headaches.  Psychiatric/Behavioral: Negative.  Negative for suicidal ideas.    Assessment and Plan: Jeffrey Barrera was seen today for boils.  Diagnoses and all orders for this visit:  Essential hypertension -     CMP14+EGFR -     amLODipine (NORVASC) 10 MG tablet; Take 1 tablet (10 mg total) by mouth daily. -     losartan-hydrochlorothiazide (HYZAAR) 100-25 MG tablet; Take 1 tablet by mouth daily. Continue all antihypertensives as prescribed.  Remember to bring in your blood pressure log with you for your follow up appointment.  DASH/Mediterranean Diets are healthier  choices for HTN.    Peripheral polyneuropathy -     gabapentin (NEURONTIN) 400 MG capsule; Take 2 capsules (800 mg total) by mouth 3 (three) times daily. NEEDS BETTER DM CONTROL  Chronic bilateral low back pain with sciatica, sciatica laterality unspecified -     tiZANidine (ZANAFLEX) 4 MG tablet; Take 1 tablet (4 mg total) by mouth every 6 (six) hours as needed for muscle spasms. Work on losing weight to help reduce back pain. May alternate with heat and ice application for pain relief. May also alternate with acetaminophen and Ibuprofen as prescribed for back pain. Other alternatives include massage, acupuncture and water aerobics.  You must stay active and avoid a sedentary lifestyle.    Pure hypercholesterolemia -     Lipid Panel -     atorvastatin (LIPITOR) 40 MG tablet; Take 1 tablet (40 mg total) by mouth at bedtime. INSTRUCTIONS: Work on a low fat, heart healthy diet and participate in regular aerobic exercise program by working out at least 150 minutes per week; 5  days a week-30 minutes per day. Avoid red meat/beef/steak,  fried foods. junk foods, sodas, sugary drinks, unhealthy snacking, alcohol and smoking.  Drink at least 80 oz of water per day and monitor your carbohydrate intake daily.   Lab Results  Component Value Date   LDLCALC 72 09/02/2019    History of MI (myocardial infarction) -     clopidogrel (PLAVIX) 75 MG tablet; Take 1 tablet (75 mg total) by mouth daily. -     metoprolol tartrate (LOPRESSOR) 25 MG tablet; Take 0.5 tablets (12.5 mg total) by mouth 2 (two) times daily.  Tobacco dependence Jeffrey Barrera was counseled on the dangers of tobacco use, and was advised to quit. Reviewed strategies to maximize success, including removing cigarettes and smoking materials from environment, stress management and support of family/friends as well as pharmacological alternatives including: Wellbutrin, Chantix, Nicotine patch, Nicotine gum or lozenges. Smoking cessation support:  smoking cessation hotline: 1-800-QUIT-NOW.  Smoking cessation classes are also available through [facility]. Call [phone] or visit our website at [email or web address]   A total of  m2inutes was spent on counseling for smoking cessation and Jeffrey Barrera is not ready to quit.    Dental abscess -     levofloxacin (LEVAQUIN) 750 MG tablet; Take 1 tablet (750 mg total) by mouth daily. -     metroNIDAZOLE (FLAGYL) 500 MG tablet; Take 1 tablet (500 mg total) by mouth 3 (three) times daily. -     CBC -     chlorhexidine (PERIDEX) 0.12 % solution; Use as directed 15 mLs in the mouth or throat 2 (two) times daily.  Encounter for screening for HIV -     HIV antibody (with reflex)     Follow Up Instructions Return in about 3 months (around 12/01/2019).     I discussed the assessment and treatment plan with the patient. The patient was provided an opportunity to ask questions and all were answered. The patient agreed with the plan and demonstrated an understanding of the instructions.   The patient was advised to call back or seek an in-person evaluation if the symptoms worsen or if the condition fails to improve as anticipated.  I provided 18 minutes of non-face-to-face time during this encounter including median intraservice time, reviewing previous notes, labs, imaging, medications and explaining diagnosis and management.  Gildardo Pounds, FNP-BC

## 2019-09-03 LAB — HEMOGLOBIN A1C
Est. average glucose Bld gHb Est-mCnc: 295 mg/dL
Hgb A1c MFr Bld: 11.9 % — ABNORMAL HIGH (ref 4.8–5.6)

## 2019-09-03 LAB — LIPID PANEL
Chol/HDL Ratio: 5.1 ratio — ABNORMAL HIGH (ref 0.0–5.0)
Cholesterol, Total: 133 mg/dL (ref 100–199)
HDL: 26 mg/dL — ABNORMAL LOW (ref >39)
LDL Chol Calc (NIH): 72 mg/dL (ref 0–99)
Triglycerides: 212 mg/dL — ABNORMAL HIGH (ref 0–149)
VLDL Cholesterol Cal: 35 mg/dL (ref 5–40)

## 2019-09-03 LAB — CBC
Hematocrit: 47.3 % (ref 37.5–51.0)
Hemoglobin: 15.7 g/dL (ref 13.0–17.7)
MCH: 29 pg (ref 26.6–33.0)
MCHC: 33.2 g/dL (ref 31.5–35.7)
MCV: 87 fL (ref 79–97)
Platelets: 306 10*3/uL (ref 150–450)
RBC: 5.41 x10E6/uL (ref 4.14–5.80)
RDW: 12.7 % (ref 11.6–15.4)
WBC: 11.1 10*3/uL — ABNORMAL HIGH (ref 3.4–10.8)

## 2019-09-03 LAB — CMP14+EGFR
ALT: 25 IU/L (ref 0–44)
AST: 20 IU/L (ref 0–40)
Albumin/Globulin Ratio: 1.6 (ref 1.2–2.2)
Albumin: 3.6 g/dL — ABNORMAL LOW (ref 4.0–5.0)
Alkaline Phosphatase: 80 IU/L (ref 39–117)
BUN/Creatinine Ratio: 11 (ref 9–20)
BUN: 8 mg/dL (ref 6–24)
Bilirubin Total: 0.5 mg/dL (ref 0.0–1.2)
CO2: 28 mmol/L (ref 20–29)
Calcium: 8.7 mg/dL (ref 8.7–10.2)
Chloride: 97 mmol/L (ref 96–106)
Creatinine, Ser: 0.75 mg/dL — ABNORMAL LOW (ref 0.76–1.27)
GFR calc Af Amer: 133 mL/min/{1.73_m2} (ref >59)
GFR calc non Af Amer: 115 mL/min/{1.73_m2} (ref >59)
Globulin, Total: 2.2 g/dL (ref 1.5–4.5)
Glucose: 412 mg/dL — ABNORMAL HIGH (ref 65–99)
Potassium: 4.2 mmol/L (ref 3.5–5.2)
Sodium: 137 mmol/L (ref 134–144)
Total Protein: 5.8 g/dL — ABNORMAL LOW (ref 6.0–8.5)

## 2019-09-03 LAB — HIV ANTIBODY (ROUTINE TESTING W REFLEX): HIV Screen 4th Generation wRfx: NONREACTIVE

## 2019-09-08 ENCOUNTER — Other Ambulatory Visit: Payer: Self-pay | Admitting: Nurse Practitioner

## 2019-09-08 ENCOUNTER — Telehealth: Payer: Self-pay

## 2019-09-08 DIAGNOSIS — E1165 Type 2 diabetes mellitus with hyperglycemia: Secondary | ICD-10-CM

## 2019-09-08 MED ORDER — TRUE METRIX BLOOD GLUCOSE TEST VI STRP: ORAL_STRIP | 12 refills | Status: DC

## 2019-09-08 MED ORDER — INSULIN GLARGINE 100 UNIT/ML SOLOSTAR PEN: 30.0000 [IU] | PEN_INJECTOR | Freq: Every day | SUBCUTANEOUS | 11 refills | Status: DC

## 2019-09-08 MED ORDER — BD PEN NEEDLE MINI U/F 31G X 5 MM MISC: 1 refills | Status: DC

## 2019-09-08 MED ORDER — INSULIN DETEMIR 100 UNIT/ML FLEXPEN: 30.0000 [IU] | PEN_INJECTOR | Freq: Every day | SUBCUTANEOUS | 11 refills | Status: DC

## 2019-09-08 MED ORDER — TRUE METRIX METER W/DEVICE KIT: PACK | 0 refills | Status: DC

## 2019-09-08 MED ORDER — METFORMIN HCL 500 MG PO TABS: 1000.0000 mg | ORAL_TABLET | Freq: Two times a day (BID) | ORAL | 0 refills | Status: DC

## 2019-09-08 MED ORDER — TRUEPLUS LANCETS 28G MISC: 3 refills | Status: DC

## 2019-09-08 NOTE — Telephone Encounter (Signed)
Pt is not able to afford his Levemir at $10.  He qualifies for free fill of the basaglar or lantus, if appropriate can you change therapy and send in new rx?

## 2019-09-08 NOTE — Telephone Encounter (Signed)
New order sent.

## 2019-09-21 ENCOUNTER — Encounter: Payer: Self-pay | Admitting: Nurse Practitioner

## 2019-09-29 ENCOUNTER — Emergency Department (HOSPITAL_COMMUNITY)
Admission: EM | Admit: 2019-09-29 | Discharge: 2019-09-29 | Discharge status: Against Medical Advice | Payer: Self-pay | Attending: Emergency Medicine | Admitting: Emergency Medicine

## 2019-09-29 ENCOUNTER — Other Ambulatory Visit: Payer: Self-pay

## 2019-09-29 ENCOUNTER — Emergency Department (HOSPITAL_COMMUNITY): Payer: Self-pay

## 2019-09-29 ENCOUNTER — Encounter (HOSPITAL_COMMUNITY): Payer: Self-pay | Admitting: Emergency Medicine

## 2019-09-29 DIAGNOSIS — Z79899 Other long term (current) drug therapy: Secondary | ICD-10-CM | POA: Insufficient documentation

## 2019-09-29 DIAGNOSIS — I10 Essential (primary) hypertension: Secondary | ICD-10-CM | POA: Insufficient documentation

## 2019-09-29 DIAGNOSIS — F17210 Nicotine dependence, cigarettes, uncomplicated: Secondary | ICD-10-CM | POA: Insufficient documentation

## 2019-09-29 DIAGNOSIS — Z7984 Long term (current) use of oral hypoglycemic drugs: Secondary | ICD-10-CM | POA: Insufficient documentation

## 2019-09-29 DIAGNOSIS — J45909 Unspecified asthma, uncomplicated: Secondary | ICD-10-CM | POA: Insufficient documentation

## 2019-09-29 DIAGNOSIS — I2510 Atherosclerotic heart disease of native coronary artery without angina pectoris: Secondary | ICD-10-CM | POA: Insufficient documentation

## 2019-09-29 DIAGNOSIS — H539 Unspecified visual disturbance: Secondary | ICD-10-CM | POA: Insufficient documentation

## 2019-09-29 LAB — COMPREHENSIVE METABOLIC PANEL
ALT: 24 U/L (ref 0–44)
AST: 19 U/L (ref 15–41)
Albumin: 3.6 g/dL (ref 3.5–5.0)
Alkaline Phosphatase: 65 U/L (ref 38–126)
Anion gap: 9 (ref 5–15)
BUN: 12 mg/dL (ref 6–20)
CO2: 27 mmol/L (ref 22–32)
Calcium: 8.7 mg/dL — ABNORMAL LOW (ref 8.9–10.3)
Chloride: 101 mmol/L (ref 98–111)
Creatinine, Ser: 0.67 mg/dL (ref 0.61–1.24)
GFR calc Af Amer: >60 mL/min (ref >60)
GFR calc non Af Amer: >60 mL/min (ref >60)
Glucose, Bld: 260 mg/dL — ABNORMAL HIGH (ref 70–99)
Potassium: 3.7 mmol/L (ref 3.5–5.1)
Sodium: 137 mmol/L (ref 135–145)
Total Bilirubin: 0.7 mg/dL (ref 0.3–1.2)
Total Protein: 6.5 g/dL (ref 6.5–8.1)

## 2019-09-29 LAB — CBC WITH DIFFERENTIAL/PLATELET
Abs Immature Granulocytes: 0.04 10*3/uL (ref 0.00–0.07)
Basophils Absolute: 0.1 10*3/uL (ref 0.0–0.1)
Basophils Relative: 1 %
Eosinophils Absolute: 0.5 10*3/uL (ref 0.0–0.5)
Eosinophils Relative: 4 %
HCT: 45 % (ref 39.0–52.0)
Hemoglobin: 14.8 g/dL (ref 13.0–17.0)
Immature Granulocytes: 0 %
Lymphocytes Relative: 42 %
Lymphs Abs: 4.7 10*3/uL — ABNORMAL HIGH (ref 0.7–4.0)
MCH: 28.5 pg (ref 26.0–34.0)
MCHC: 32.9 g/dL (ref 30.0–36.0)
MCV: 86.5 fL (ref 80.0–100.0)
Monocytes Absolute: 0.5 10*3/uL (ref 0.1–1.0)
Monocytes Relative: 5 %
Neutro Abs: 5.3 10*3/uL (ref 1.7–7.7)
Neutrophils Relative %: 48 %
Platelets: 293 10*3/uL (ref 150–400)
RBC: 5.2 MIL/uL (ref 4.22–5.81)
RDW: 13 % (ref 11.5–15.5)
WBC: 11.1 10*3/uL — ABNORMAL HIGH (ref 4.0–10.5)
nRBC: 0 % (ref 0.0–0.2)

## 2019-09-29 LAB — CBG MONITORING, ED: Glucose-Capillary: 250 mg/dL — ABNORMAL HIGH (ref 70–99)

## 2019-09-29 MED ORDER — SODIUM CHLORIDE 0.9 % IV BOLUS
500.0000 mL | Freq: Once | INTRAVENOUS | Status: AC
  Administered 2019-09-29: 16:00:00 500 mL via INTRAVENOUS

## 2019-09-29 MED ORDER — ACETAMINOPHEN 500 MG PO TABS
1000.0000 mg | ORAL_TABLET | Freq: Once | ORAL | Status: AC
  Administered 2019-09-29: 16:00:00 1000 mg via ORAL
  Filled 2019-09-29: qty 2

## 2019-09-29 MED ORDER — FLUORESCEIN SODIUM 1 MG OP STRP
1.0000 | ORAL_STRIP | Freq: Once | OPHTHALMIC | Status: AC
  Administered 2019-09-29: 13:00:00 1 via OPHTHALMIC
  Filled 2019-09-29: qty 1

## 2019-09-29 MED ORDER — METHYLPREDNISOLONE SODIUM SUCC 125 MG IJ SOLR
125.0000 mg | Freq: Once | INTRAMUSCULAR | Status: AC
  Administered 2019-09-29: 16:00:00 125 mg via INTRAVENOUS
  Filled 2019-09-29: qty 2

## 2019-09-29 MED ORDER — METOCLOPRAMIDE HCL 5 MG/ML IJ SOLN
10.0000 mg | Freq: Once | INTRAMUSCULAR | Status: AC
  Administered 2019-09-29: 16:00:00 10 mg via INTRAVENOUS
  Filled 2019-09-29: qty 2

## 2019-09-29 MED ORDER — TETRACAINE HCL 0.5 % OP SOLN
2.0000 [drp] | Freq: Once | OPHTHALMIC | Status: AC
  Administered 2019-09-29: 13:00:00 2 [drp] via OPHTHALMIC
  Filled 2019-09-29: qty 4

## 2019-09-29 MED ORDER — LORAZEPAM 2 MG/ML IJ SOLN
1.0000 mg | Freq: Once | INTRAMUSCULAR | Status: AC
  Administered 2019-09-29: 16:00:00 1 mg via INTRAVENOUS
  Filled 2019-09-29: qty 1

## 2019-09-29 NOTE — Progress Notes (Signed)
Pt did not fit in scanner pt weighs 348. Pt stated he would go back to the ED and get his discharge papers and followup as an outpatient.

## 2019-09-29 NOTE — ED Notes (Signed)
Pt refused IV insertion, provider notified

## 2019-09-29 NOTE — ED Provider Notes (Signed)
[facility] DEPT Provider Note   CSN: [id number] Arrival date & time: 09/29/19  1027     History Chief Complaint  Patient presents with  . vision changes    Seith Aikey. is a 39 y.o. male.  HPI      Tobenna Needs. is a 39 y.o. male, with a history of CAD, asthma, MI, presenting to the ED with vision loss. He notes he started losing color vision in right eye in May 2020. At first he states he "lost" three colors. Gradually added more colors lost over last few months. He had to stop driving around July because he couldn't see the difference in traffic lights.  Yesterday, he noted "milky" vision in the right eye where he could still see light differences and vague outlines of objects. This morning, woke up with completely blacked out vision through the entire visual field in the right eye.   He has had headaches to the right temple area and around the right eye that were intermittent, occurring almost daily, beginning around the same time the vision changes occurred in May 2020. Two weeks ago, these headaches became constant. Current rates his headache 6/10, sharp, nonradiating. Accompanied by numbness to the right of the right eye.   Gradual loss of vision in left eye several years ago he states is due to medication reaction. He had similar headaches while his vision was failing, but the headaches on the left stopped once his vision was completely gone.   He has been working with his PCP on his headaches and vision loss, but states he has not been able to see an ophthalmologist due to insurance concerns.   Newly diagnosed with DM Dec 2020 and placed on metformin and insulin.  Denies fever/chills, facial droop, difficulty speaking, neck pain, extremity numbness/weakness, difficulty swallowing, falls/trauma, chest pain, shortness of breath, syncope, dizziness, or any other complaints.     Past Medical History:  Diagnosis Date  . Arthritis   .  Asthma   . Claudication ([place]) 04/02/2017  . Coronary artery disease involving native coronary artery of native heart without angina pectoris 04/02/2017  . Essential hypertension 03/07/2017  . Glucose intolerance (impaired glucose tolerance) 03/15/2017  . Hypertension   . Myocardial infarction ([place])   . Paresthesia of lower extremity 04/02/2017  . Pure hypercholesterolemia 03/07/2017  . Snoring 03/13/2018  . Tobacco abuse 03/07/2017    Patient Active Problem List   Diagnosis Date Noted  . Asthma   . Sleep apnea 03/13/2018  . Claudication in peripheral vascular disease ([place]) 04/02/2017  . Coronary artery disease involving native coronary artery of native heart without angina pectoris 04/02/2017  . Paresthesia of lower extremity 04/02/2017  . Glucose intolerance (impaired glucose tolerance) 03/15/2017  . Essential hypertension 03/07/2017  . Pure hypercholesterolemia 03/07/2017  . Tobacco abuse 03/07/2017    Past Surgical History:  Procedure Laterality Date  . CARDIAC CATHETERIZATION    . cardiac stents    . CORONARY ANGIOPLASTY    . LEFT HEART CATH AND CORONARY ANGIOGRAPHY N/A 07/11/2018   Procedure: LEFT HEART CATH AND CORONARY ANGIOGRAPHY;  Surgeon: Nelva Bush, MD;  Location: [facility];  Service: Cardiovascular;  Laterality: N/A;       Family History  Problem Relation Age of Onset  . Hypertension Mother   . Heart attack Mother   . Heart disease Mother   . Hypertension Father     Social History   Tobacco Use  . Smoking status:  Current Every Day Smoker    Types: Cigarettes  . Smokeless tobacco: Former Systems developer    Types: Chew    Quit date: 2000  . Tobacco comment: 4 cigarettes   Substance Use Topics  . Alcohol use: Not Currently  . Drug use: Not Currently    Home Medications Prior to Admission medications   Medication Sig Start Date End Date Taking? Authorizing Provider  albuterol (VENTOLIN HFA) 108 (90 Base) MCG/ACT inhaler Inhale 1-2 puffs into the lungs  every 6 (six) hours as needed for wheezing or shortness of breath. 03/26/19  Yes Gildardo Pounds, NP  amLODipine (NORVASC) 10 MG tablet Take 1 tablet (10 mg total) by mouth daily. 09/02/19  Yes Gildardo Pounds, NP  atorvastatin (LIPITOR) 40 MG tablet Take 1 tablet (40 mg total) by mouth at bedtime. 09/02/19  Yes Gildardo Pounds, NP  chlorhexidine (PERIDEX) 0.12 % solution Use as directed 15 mLs in the mouth or throat 2 (two) times daily. 09/02/19  Yes Gildardo Pounds, NP  clopidogrel (PLAVIX) 75 MG tablet Take 1 tablet (75 mg total) by mouth daily. 09/02/19  Yes Gildardo Pounds, NP  gabapentin (NEURONTIN) 400 MG capsule Take 2 capsules (800 mg total) by mouth 3 (three) times daily. 09/02/19  Yes Gildardo Pounds, NP  Insulin Glargine (LANTUS) 100 UNIT/ML Solostar Pen Inject 30 Units into the skin daily at 10 pm. 09/08/19 10/08/19 Yes Gildardo Pounds, NP  losartan-hydrochlorothiazide (HYZAAR) 100-25 MG tablet Take 1 tablet by mouth daily. 09/02/19  Yes Gildardo Pounds, NP  metFORMIN (GLUCOPHAGE) 500 MG tablet Take 2 tablets (1,000 mg total) by mouth 2 (two) times daily with a meal. 09/08/19 12/07/19 Yes Gildardo Pounds, NP  metoprolol tartrate (LOPRESSOR) 25 MG tablet Take 0.5 tablets (12.5 mg total) by mouth 2 (two) times daily. 09/02/19 10/02/19 Yes Gildardo Pounds, NP  PARoxetine (PAXIL) 10 MG tablet Take 10 mg by mouth daily.   Yes [provider]  PRAZOSIN HCL PO Take 1 mg by mouth at bedtime.   Yes [provider]  ramelteon (ROZEREM) 8 MG tablet Take 8 mg by mouth at bedtime.   Yes [provider]  tiZANidine (ZANAFLEX) 4 MG tablet Take 1 tablet (4 mg total) by mouth every 6 (six) hours as needed for muscle spasms. 09/02/19  Yes Gildardo Pounds, NP  traZODone (DESYREL) 150 MG tablet Take 1 tablet (150 mg total) by mouth at bedtime. 03/26/19 09/29/19 Yes Gildardo Pounds, NP  acetaminophen (TYLENOL) 500 MG tablet Take 2 tablets (1,000 mg total) by mouth 3 (three)  times daily. Patient not taking: Reported on 09/29/2019 07/04/18   Richardo Priest, MD  Blood Glucose Monitoring Suppl (TRUE METRIX METER) w/Device KIT Use as instructed 09/08/19   Gildardo Pounds, NP  buPROPion [facility] SR) 200 MG 12 hr tablet Take 1 tablet (200 mg total) by mouth 2 (two) times daily. 03/26/19 04/25/19  Gildardo Pounds, NP  furosemide (LASIX) 40 MG tablet Take 1 tablet (40 mg total) by mouth daily. Patient not taking: Reported on 09/02/2019 07/11/18   Reino Bellis B, NP  glucose blood (TRUE METRIX BLOOD GLUCOSE TEST) test strip Use as instructed. Check blood glucose level by fingerstick twice per day.  E11.65 09/08/19   Gildardo Pounds, NP  Insulin Pen Needle (B-D UF III MINI PEN NEEDLES) 31G X 5 MM MISC Use as instructed. Inject into the skin once nightly. 09/08/19   Gildardo Pounds, NP  levofloxacin (  LEVAQUIN) 750 MG tablet Take 1 tablet (750 mg total) by mouth daily. 09/02/19   Gildardo Pounds, NP  metroNIDAZOLE (FLAGYL) 500 MG tablet Take 1 tablet (500 mg total) by mouth 3 (three) times daily. 09/02/19   Gildardo Pounds, NP  nitroGLYCERIN (NITROSTAT) 0.4 MG SL tablet Place 1 tablet (0.4 mg total) under the tongue every 5 (five) minutes as needed for chest pain. Patient not taking: Reported on 11/18/2018 07/04/18 10/02/18  Richardo Priest, MD  SUMAtriptan (IMITREX) 100 MG tablet Take 1 tablet (100 mg total) by mouth every 2 (two) hours as needed for migraine (Max 2 tablets daily). Repeat in 2 hrs if headache persists Patient not taking: Reported on 09/29/2019 03/26/19 09/29/19  Gildardo Pounds, NP  TRUEplus Lancets 28G MISC Use as instructed. Check blood glucose level by fingerstick twice per day.  E11.65 09/08/19   Gildardo Pounds, NP    Allergies    Asa [aspirin], Lemon flavor, Other, Penicillins, Lisinopril, Clindamycin, and Erythromycin  Review of Systems   Review of Systems  Constitutional: Negative for chills, diaphoresis and fever.  HENT: Negative for facial  swelling.   Eyes: Positive for visual disturbance. Negative for pain, discharge and redness.  Respiratory: Negative for shortness of breath.   Cardiovascular: Negative for chest pain.  Gastrointestinal: Negative for abdominal pain, diarrhea, nausea and vomiting.  Musculoskeletal: Negative for neck pain.  Neurological: Positive for numbness and headaches. Negative for dizziness, syncope, facial asymmetry, speech difficulty and weakness.  All other systems reviewed and are negative.   Physical Exam Updated Vital Signs BP (!) 151/94 (BP Location: Right Arm)   Pulse 71   Temp 98.2 F (36.8 C) (Oral)   Resp 20   Ht '6\' 3"'$  (1.905 m)   Wt (!) 158.3 kg   SpO2 99%   BMI 43.62 kg/m   Physical Exam Vitals and nursing note reviewed.  Constitutional:      General: He is not in acute distress.    Appearance: He is well-developed. He is obese. He is not diaphoretic.  HENT:     Head: Normocephalic and atraumatic.     Mouth/Throat:     Mouth: Mucous membranes are moist.     Pharynx: Oropharynx is clear.  Eyes:     Conjunctiva/sclera: Conjunctivae normal.     Pupils: Pupils are equal, round, and reactive to light.     Comments: Initially, it seems as though patient had difficulty with upward gaze, however, during application of tetracaine drops, patient was noted to gaze upward. Cornea and lens appear to be clear. No contact lenses in place.  EOMs are intact and in sync.  No pain with EOMs.  No noted eye or lid ptosis.  Pupils are PERRL. Tono-Pen values: Right eye: 16  Left eye: 20  Patient unable to complete visual acuity.  Cardiovascular:     Rate and Rhythm: Normal rate and regular rhythm.     Pulses: Normal pulses.          Radial pulses are 2+ on the right side and 2+ on the left side.       Posterior tibial pulses are 2+ on the right side and 2+ on the left side.     Heart sounds: Normal heart sounds.     Comments: Tactile temperature in the extremities appropriate and equal  bilaterally. Pulmonary:     Effort: Pulmonary effort is normal. No respiratory distress.     Breath sounds: Normal breath sounds.  Abdominal:  Palpations: Abdomen is soft.     Tenderness: There is no abdominal tenderness. There is no guarding.  Musculoskeletal:     Cervical back: Neck supple.     Right lower leg: No edema.     Left lower leg: No edema.  Lymphadenopathy:     Cervical: No cervical adenopathy.  Skin:    General: Skin is warm and dry.  Neurological:     Mental Status: He is alert and oriented to person, place, and time.     Comments: Sensation grossly intact to light touch in the extremities. No noted speech deficits. No aphasia. Patient handles oral secretions without difficulty. No noted swallowing defects.  Equal grip strength bilaterally. No arm drift. Strength 5/5 in the upper extremities. Strength 5/5 in the lower extremities.  Coordination intact including heel to shin and finger to nose.  Cranial nerves III-XII grossly intact.  EOMs are intact and in sync. No noted eye or lid ptosis.  Pupils are PERRL. No facial droop.   Psychiatric:        Mood and Affect: Mood and affect normal.        Speech: Speech normal.        Behavior: Behavior normal.     ED Results / Procedures / Treatments   Labs (all labs ordered are listed, but only abnormal results are displayed) Labs Reviewed  COMPREHENSIVE METABOLIC PANEL - Abnormal; Notable for the following components:      Result Value   Glucose, Bld 260 (*)    Calcium 8.7 (*)    All other components within normal limits  CBC WITH DIFFERENTIAL/PLATELET - Abnormal; Notable for the following components:   WBC 11.1 (*)    Lymphs Abs 4.7 (*)    All other components within normal limits  CBG MONITORING, ED - Abnormal; Notable for the following components:   Glucose-Capillary 250 (*)    All other components within normal limits    EKG None  Radiology No results found.  Procedures Procedures (including  critical care time)  Medications Ordered in ED Medications  tetracaine (PONTOCAINE) 0.5 % ophthalmic solution 2 drop (2 drops Both Eyes Given 09/29/19 1247)  fluorescein ophthalmic strip 1 strip (1 strip Right Eye Given 09/29/19 1247)  methylPREDNISolone sodium succinate (SOLU-MEDROL) 125 mg/2 mL injection 125 mg (125 mg Intravenous Given 09/29/19 1539)  metoCLOPramide (REGLAN) injection 10 mg (10 mg Intravenous Given 09/29/19 1539)  sodium chloride 0.9 % bolus 500 mL (0 mLs Intravenous Stopped 09/29/19 1659)  acetaminophen (TYLENOL) tablet 1,000 mg (1,000 mg Oral Given 09/29/19 1538)  LORazepam (ATIVAN) injection 1 mg (1 mg Intravenous Given 09/29/19 1539)    ED Course  I have reviewed the triage vital signs and the nursing notes.  Pertinent labs & imaging results that were available during my care of the patient were reviewed by me and considered in my medical decision making (see chart for details).  Clinical Course as of Sep 28 2142  Mon Sep 29, 2019  1338 Spoke with Dr. Cheral Marker, neurologist.  Recommends MRI of brain and orbits with and without contrast as well as MRA head.   [SJ]  1400 Patient is telling the RN that he does not want to have an IV.  He is not sure he wants to stay. I had an extended conversation with the patient as well as his wife, via phone. I discussed my concerns with the patient and his wife as well as the purpose of the imaging studies.  We also discussed  just how much we were doing for the patient here in the ED to assure he was able to get the proper imaging studies performed as soon as possible.  Patient's wife voices understanding and assisted in helping the patient understand the importance of staying for these imaging studies and therefore allowing required interventions, such as IV. Patient agreed to stay.   [SJ]  [place] with Dr. Manuella Ghazi, ophthalmology.  States he has concern for optic neuritis.  Agrees with currently ordered imaging studies.  Contact him back  with results.  If MRI is negative, he will see the patient in the office tomorrow or Wednesday.   [SJ]  7824 Patient appears comfortable, sleeping on the bed.   [SJ]    Clinical Course User Index [SJ] Linea Calles, Helane Gunther, PA-C   MDM Rules/Calculators/A&P                      Patient presents with complaint of vision loss in the right eye. No noted objective abnormalities on physical exam.  Although he had worsening vision deficits over the last several months, the acute loss of vision is what was primarily being investigated here in the emergency department. Unfortunately, when the patient was brought to the MRI suite, he was under the table weight limit, however, his shoulders were noted to be too broad to allow him to enter the machine.  Patient reportedly then told the MRI tech and the RN that he would "just go get the scans done outpatient." He told the RN that he was leaving.  He followed the RN down the hall and into the lobby independently without assistance.  He would not wait for me to come speak to him again, give him alternative plans and information for follow-up, or do anything else.  He left so quickly that the RN did not have a chance to notify me prior to the patient walking out. Patient left the emergency department [place]. Dr. Manuella Ghazi, ophthalmologist, was updated with this development.  He states he is willing to see the patient at any time should he change his mind.  Findings and plan of care discussed with Hal Neer, MD. Dr. Roslynn Amble personally evaluated and examined this patient.  There were multiple episodes of this patient telling us that he changed his mind about further evaluation.  I spent a considerable amount of time discussing our concerns with the patient.  To assure the patient was still oriented and not confused, I would asked the patient if he remembered what we discussed previously.  He was able to tell me some of the concerns we had for him.  I would  then reiterate the importance of what these things meant and the importance of him staying for the imaging studies.  Of note, when the patient was first evaluated, he would simply stare straight ahead and not respond in any way with his eyes to movement in the room or changes in light.  However, later in the ED course it was noted that the patient would turn his head towards someone speaking or walking in the door.  It also seemed as though patient would look the staff members up and down as they introduced themselves.   Final Clinical Impression(s) / ED Diagnoses Final diagnoses:  Vision abnormalities    Rx / DC Orders ED Discharge Orders    None       Layla Maw 09/29/19 2145    Lucrezia Starch, MD  09/30/19 0838  

## 2019-09-29 NOTE — ED Triage Notes (Signed)
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

## 2019-12-09 ENCOUNTER — Encounter (HOSPITAL_COMMUNITY): Payer: Self-pay | Admitting: Emergency Medicine

## 2019-12-09 ENCOUNTER — Emergency Department (HOSPITAL_COMMUNITY)
Admission: EM | Admit: 2019-12-09 | Discharge: 2019-12-09 | Disposition: A | Discharge status: Home / Self Care | Payer: Self-pay | Attending: Emergency Medicine | Admitting: Emergency Medicine

## 2019-12-09 ENCOUNTER — Other Ambulatory Visit: Payer: Self-pay

## 2019-12-09 DIAGNOSIS — L03011 Cellulitis of right finger: Secondary | ICD-10-CM | POA: Insufficient documentation

## 2019-12-09 DIAGNOSIS — Z7984 Long term (current) use of oral hypoglycemic drugs: Secondary | ICD-10-CM | POA: Insufficient documentation

## 2019-12-09 DIAGNOSIS — F17210 Nicotine dependence, cigarettes, uncomplicated: Secondary | ICD-10-CM | POA: Insufficient documentation

## 2019-12-09 DIAGNOSIS — I10 Essential (primary) hypertension: Secondary | ICD-10-CM | POA: Insufficient documentation

## 2019-12-09 DIAGNOSIS — J45909 Unspecified asthma, uncomplicated: Secondary | ICD-10-CM | POA: Insufficient documentation

## 2019-12-09 DIAGNOSIS — I2510 Atherosclerotic heart disease of native coronary artery without angina pectoris: Secondary | ICD-10-CM | POA: Insufficient documentation

## 2019-12-09 DIAGNOSIS — Z79899 Other long term (current) drug therapy: Secondary | ICD-10-CM | POA: Insufficient documentation

## 2019-12-09 MED ORDER — BUPIVACAINE HCL (PF) 0.5 % IJ SOLN
10.0000 mL | Freq: Once | INTRAMUSCULAR | Status: AC
  Administered 2019-12-09: 10 mL
  Filled 2019-12-09: qty 30

## 2019-12-09 NOTE — ED Provider Notes (Signed)
[facility] DEPT Provider Note   CSN: [id number] Arrival date & time: 12/09/19  1248     History Chief Complaint  Patient presents with  . Hand Pain    Jeffrey Barrera. is a 39 y.o. male.  HPI      Jeffrey Barrera. is a 38 y.o. male, with a history of asthma, CAD, HTN, tobacco use, presenting to the ED with right thumb pain beginning Friday, March 19. Patient states he shot his thumb in a door at his house.  This cracked the nail into 3 pieces.  When he tried to remove these pieces, he states he pulled some skin off the edge of the nail.  He began to have swelling, redness, and increased pain over the last couple days. Denies fever/chills, spreading redness, numbness, weakness, or any other complaints.     Past Medical History:  Diagnosis Date  . Arthritis   . Asthma   . Claudication ([place]) 04/02/2017  . Coronary artery disease involving native coronary artery of native heart without angina pectoris 04/02/2017  . Essential hypertension 03/07/2017  . Glucose intolerance (impaired glucose tolerance) 03/15/2017  . Hypertension   . Myocardial infarction ([place])   . Paresthesia of lower extremity 04/02/2017  . Pure hypercholesterolemia 03/07/2017  . Snoring 03/13/2018  . Tobacco abuse 03/07/2017    Patient Active Problem List   Diagnosis Date Noted  . Asthma   . Sleep apnea 03/13/2018  . Claudication in peripheral vascular disease ([place]) 04/02/2017  . Coronary artery disease involving native coronary artery of native heart without angina pectoris 04/02/2017  . Paresthesia of lower extremity 04/02/2017  . Glucose intolerance (impaired glucose tolerance) 03/15/2017  . Essential hypertension 03/07/2017  . Pure hypercholesterolemia 03/07/2017  . Tobacco abuse 03/07/2017    Past Surgical History:  Procedure Laterality Date  . CARDIAC CATHETERIZATION    . cardiac stents    . CORONARY ANGIOPLASTY    . LEFT HEART CATH AND CORONARY ANGIOGRAPHY N/A  07/11/2018   Procedure: LEFT HEART CATH AND CORONARY ANGIOGRAPHY;  Surgeon: Nelva Bush, MD;  Location: [facility];  Service: Cardiovascular;  Laterality: N/A;       Family History  Problem Relation Age of Onset  . Hypertension Mother   . Heart attack Mother   . Heart disease Mother   . Hypertension Father     Social History   Tobacco Use  . Smoking status: Current Every Day Smoker    Types: Cigarettes  . Smokeless tobacco: Former Systems developer    Types: Chew    Quit date: 2000  . Tobacco comment: 4 cigarettes   Substance Use Topics  . Alcohol use: Not Currently  . Drug use: Not Currently    Home Medications Prior to Admission medications   Medication Sig Start Date End Date Taking? Authorizing Provider  acetaminophen (TYLENOL) 500 MG tablet Take 2 tablets (1,000 mg total) by mouth 3 (three) times daily. Patient not taking: Reported on 09/29/2019 07/04/18   Richardo Priest, MD  albuterol (VENTOLIN HFA) 108 (90 Base) MCG/ACT inhaler Inhale 1-2 puffs into the lungs every 6 (six) hours as needed for wheezing or shortness of breath. 03/26/19   Gildardo Pounds, NP  amLODipine (NORVASC) 10 MG tablet Take 1 tablet (10 mg total) by mouth daily. 09/02/19   Gildardo Pounds, NP  atorvastatin (LIPITOR) 40 MG tablet Take 1 tablet (40 mg total) by mouth at bedtime. 09/02/19   Gildardo Pounds, NP  Blood Glucose  Monitoring Suppl (TRUE METRIX METER) w/Device KIT Use as instructed 09/08/19   Gildardo Pounds, NP  buPROPion [facility] SR) 200 MG 12 hr tablet Take 1 tablet (200 mg total) by mouth 2 (two) times daily. 03/26/19 04/25/19  Gildardo Pounds, NP  chlorhexidine (PERIDEX) 0.12 % solution Use as directed 15 mLs in the mouth or throat 2 (two) times daily. 09/02/19   Gildardo Pounds, NP  clopidogrel (PLAVIX) 75 MG tablet Take 1 tablet (75 mg total) by mouth daily. 09/02/19   Gildardo Pounds, NP  furosemide (LASIX) 40 MG tablet Take 1 tablet (40 mg total) by mouth daily. Patient not  taking: Reported on 09/02/2019 07/11/18   Reino Bellis B, NP  gabapentin (NEURONTIN) 400 MG capsule Take 2 capsules (800 mg total) by mouth 3 (three) times daily. 09/02/19   Gildardo Pounds, NP  glucose blood (TRUE METRIX BLOOD GLUCOSE TEST) test strip Use as instructed. Check blood glucose level by fingerstick twice per day.  E11.65 09/08/19   Gildardo Pounds, NP  Insulin Glargine (LANTUS) 100 UNIT/ML Solostar Pen Inject 30 Units into the skin daily at 10 pm. 09/08/19 10/08/19  Gildardo Pounds, NP  Insulin Pen Needle (B-D UF III MINI PEN NEEDLES) 31G X 5 MM MISC Use as instructed. Inject into the skin once nightly. 09/08/19   Gildardo Pounds, NP  levofloxacin (LEVAQUIN) 750 MG tablet Take 1 tablet (750 mg total) by mouth daily. 09/02/19   Gildardo Pounds, NP  losartan-hydrochlorothiazide (HYZAAR) 100-25 MG tablet Take 1 tablet by mouth daily. 09/02/19   Gildardo Pounds, NP  metFORMIN (GLUCOPHAGE) 500 MG tablet Take 2 tablets (1,000 mg total) by mouth 2 (two) times daily with a meal. 09/08/19 12/07/19  Gildardo Pounds, NP  metoprolol tartrate (LOPRESSOR) 25 MG tablet Take 0.5 tablets (12.5 mg total) by mouth 2 (two) times daily. 09/02/19 10/02/19  Gildardo Pounds, NP  metroNIDAZOLE (FLAGYL) 500 MG tablet Take 1 tablet (500 mg total) by mouth 3 (three) times daily. 09/02/19   Gildardo Pounds, NP  nitroGLYCERIN (NITROSTAT) 0.4 MG SL tablet Place 1 tablet (0.4 mg total) under the tongue every 5 (five) minutes as needed for chest pain. Patient not taking: Reported on 11/18/2018 07/04/18 10/02/18  Richardo Priest, MD  PARoxetine (PAXIL) 10 MG tablet Take 10 mg by mouth daily.    [provider]  PRAZOSIN HCL PO Take 1 mg by mouth at bedtime.    [provider]  ramelteon (ROZEREM) 8 MG tablet Take 8 mg by mouth at bedtime.    [provider]  SUMAtriptan (IMITREX) 100 MG tablet Take 1 tablet (100 mg total) by mouth every 2 (two) hours as needed for migraine (Max 2 tablets  daily). Repeat in 2 hrs if headache persists Patient not taking: Reported on 09/29/2019 03/26/19 09/29/19  Gildardo Pounds, NP  tiZANidine (ZANAFLEX) 4 MG tablet Take 1 tablet (4 mg total) by mouth every 6 (six) hours as needed for muscle spasms. 09/02/19   Gildardo Pounds, NP  traZODone (DESYREL) 150 MG tablet Take 1 tablet (150 mg total) by mouth at bedtime. 03/26/19 09/29/19  Gildardo Pounds, NP  TRUEplus Lancets 28G MISC Use as instructed. Check blood glucose level by fingerstick twice per day.  E11.65 09/08/19   Gildardo Pounds, NP    Allergies    Asa [aspirin], Lemon flavor, Other, Penicillins, Lisinopril, Clindamycin, and Erythromycin  Review of Systems   Review of Systems  Constitutional: Negative for fever.  Skin: Positive for color change.  Neurological: Negative for weakness and numbness.    Physical Exam Updated Vital Signs BP (!) 146/78 (BP Location: Right Arm)   Pulse 89   Temp 98.9 F (37.2 C) (Oral)   Resp 19   SpO2 99%   Physical Exam Vitals and nursing note reviewed.  Constitutional:      General: He is not in acute distress.    Appearance: He is well-developed. He is not diaphoretic.  HENT:     Head: Normocephalic and atraumatic.  Eyes:     Conjunctiva/sclera: Conjunctivae normal.  Cardiovascular:     Rate and Rhythm: Normal rate and regular rhythm.  Pulmonary:     Effort: Pulmonary effort is normal.  Musculoskeletal:     Cervical back: Neck supple.     Comments: Right thumb with swelling along the radial edge of the nail with associated subcutaneous purulence and regional tenderness.  Presentation consistent with paronychia. No tenderness or swelling to the rest of the thumb.  No noted lacerations under the nail.  No noted subungual hematoma.  Skin:    General: Skin is warm and dry.     Capillary Refill: Capillary refill takes less than 2 seconds.     Coloration: Skin is not pale.  Neurological:     Mental Status: He is alert.  Psychiatric:         Behavior: Behavior normal.     ED Results / Procedures / Treatments   Labs (all labs ordered are listed, but only abnormal results are displayed) Labs Reviewed - No data to display  EKG None  Radiology No results found.  Procedures .Nerve Block  Date/Time: 12/09/2019 3:02 PM Performed by: Lorayne Bender, PA-C Authorized by: Lorayne Bender, PA-C   Consent:    Consent obtained:  Verbal   Consent given by:  Patient   Risks discussed:  Nerve damage, swelling, unsuccessful block, pain and bleeding Indications:    Indications:  Procedural anesthesia Location:    Body area:  Upper extremity   Upper extremity nerve blocked: Right thumb, digital block.   Laterality:  Left Pre-procedure details:    Skin preparation:  Alcohol Procedure details (see MAR for exact dosages):    Block needle gauge:  25 G   Anesthetic injected:  Bupivacaine 0.5% w/o epi   Injection procedure:  Anatomic landmarks identified, anatomic landmarks palpated, incremental injection, introduced needle and negative aspiration for blood Post-procedure details:    Outcome:  Anesthesia achieved   Patient tolerance of procedure:  Tolerated well, no immediate complications Drain paronychia  Date/Time: 12/09/2019 3:40 PM Performed by: Lorayne Bender, PA-C Authorized by: Lorayne Bender, PA-C  Consent: Verbal consent obtained. Risks and benefits: risks, benefits and alternatives were discussed Consent given by: patient Patient understanding: patient states understanding of the procedure being performed Patient identity confirmed: verbally with patient and provided demographic data Local anesthesia used: yes Anesthesia: digital block  Anesthesia: Local anesthesia used: yes Local Anesthetic: bupivacaine 0.5% without epinephrine  Sedation: Patient sedated: no  Patient tolerance: patient tolerated the procedure well with no immediate complications Comments: Moderate amount of purulent material able to be expressed from  paronychia    (including critical care time)  Medications Ordered in ED Medications  bupivacaine (MARCAINE) 0.5 % injection 10 mL (10 mLs Infiltration Given 12/09/19 1424)    ED Course  I have reviewed the triage vital signs and the nursing notes.  Pertinent labs & imaging results  that were available during my care of the patient were reviewed by me and considered in my medical decision making (see chart for details).    MDM Rules/Calculators/A&P                      Patient presents with pain to the right thumb.  On exam, he has evidence of paronychia. No evidence of systemic illness. Due to mechanism original injury, x-ray was recommended with explanation.  Patient declined this x-ray. The patient was given instructions for home care as well as return precautions. Patient voices understanding of these instructions, accepts the plan, and is comfortable with discharge.   Final Clinical Impression(s) / ED Diagnoses Final diagnoses:  Paronychia of right thumb    Rx / DC Orders ED Discharge Orders    None       Layla Maw 12/09/19 1633    Quintella Reichert, MD 12/09/19 1705

## 2019-12-09 NOTE — Discharge Instructions (Addendum)
Wound Care - After I&D of Abscess  You may remove the bandage after 24 hours.  The only reason to replace the bandage is to protect clothing from drainage. Bandages, if used, should be replaced daily or whenever soiled. The wound may continue to drain for the next 2-3 days.   Cleaning: Clean the wound and surrounding area gently with tap water and mild soap. Rinse well and blot dry. You may shower normally. Soaking the wound in Epsom salt baths for no more than 15 minutes once a day may help rinse out any remaining pus and help with wound healing.  Clean the wound daily to prevent further infection. Do not use cleaners such as hydrogen peroxide or alcohol.   Scar reduction: Application of a topical antibiotic ointment, such as Neosporin, after the wound has begun to close and heal well can decrease scab formation and reduce scarring. After the wound has healed, application of ointments such as Aquaphor can also reduce scar formation.  The key to scar reduction is keeping the skin well hydrated and supple. Drinking plenty of water throughout the day (At least eight 8oz glasses of water a day) is essential to staying well hydrated.  Sun exposure: Keep the wound out of the sun. After the wound has healed, continue to protect it from the sun by wearing protective clothing or applying sunscreen.  Pain: You may use Tylenol for pain.  Prevention: There are some people who have a predisposition to abscess formation, however, there are some things that can be done to prevent abscesses in many people.  Most abscesses form because bacteria that naturally lives on the skin gets trapped underneath the skin.  This can occur through openings too small to see. Before and after any area of skin is shaved, wax, or abraded in any manner, the area should be washed with soap and water and rinsed well.   If you are having trouble with recurrent abscesses, it may be wise to perform a chlorhexidine wash regimen.  For 1  week, wash all of your body with chlorhexidine (available over-the-counter at most pharmacies). You may also need to reevaluate your use of daily soap as soaps with perfumes or dyes can increase the chances of infection in some people.  Follow up: Please return to the ED or go to your primary care provider in 2-3 days for a wound check to assure proper healing.  Return: Return to the ED sooner should signs of worsening infection arise, such as spreading redness, worsening puffiness/swelling, severe increase in pain, fever over 100.82F, or any other major issues.  For prescription assistance, may try using prescription discount sites or apps, such as goodrx.com

## 2019-12-09 NOTE — ED Triage Notes (Signed)
Patient here from home with complaints of right thumb pain x2 days. Reports possible infection.

## 2019-12-09 NOTE — ED Notes (Signed)
Band aid applied to thumb

## 2020-01-26 ENCOUNTER — Telehealth: Payer: Self-pay | Admitting: Physician Assistant

## 2020-01-26 DIAGNOSIS — R059 Cough, unspecified: Secondary | ICD-10-CM

## 2020-01-26 DIAGNOSIS — R05 Cough: Secondary | ICD-10-CM

## 2020-01-26 DIAGNOSIS — J321 Chronic frontal sinusitis: Secondary | ICD-10-CM

## 2020-01-26 MED ORDER — ACETAMINOPHEN-CODEINE #3 300-30 MG PO TABS: 1.0000 | ORAL_TABLET | ORAL | 0 refills | Status: AC | PRN

## 2020-01-26 MED ORDER — DOXYCYCLINE HYCLATE 100 MG PO TABS: 100.0000 mg | ORAL_TABLET | Freq: Two times a day (BID) | ORAL | 0 refills | Status: DC

## 2020-01-26 MED ORDER — ACETAMINOPHEN-CODEINE 120-12 MG/5ML PO SOLN: 2.5000 mL | ORAL | 0 refills | Status: DC | PRN

## 2020-01-26 NOTE — Progress Notes (Signed)
Patient verified DOB Patient has eaten today. Patient has taken medication today. Patient complains of coughing with rib cage pain, productive cough has been present for 3 weeks with a greenish brown coloration. Patients wife is on antibiotic for similar symptoms. Fever was present the first week and has broken.

## 2020-01-26 NOTE — Progress Notes (Signed)
Virtual Visit via Telephone Note  I connected with Jeffrey Barrera. on 01/26/20 at 11:20 AM EDT by telephone and verified that I am speaking with the correct person using two identifiers.  Location: Patient: home Provider: [facility]   I discussed the limitations, risks, security and privacy concerns of performing an evaluation and management service by telephone and the availability of in person appointments. I also discussed with the patient that there may be a patient responsible charge related to this service. The patient expressed understanding and agreed to proceed.   History of Present Illness:  Reports that he has been having sinus pressure and pain, brownish-greenish nasal discharge, productive cough with brownish-greenish sputum for the past 2 weeks.  Reports the first week he did have recorded temperatures of 102.  Reports that he has tried hot cocktails without relief, has used 4 bottles of NyQuil without relief.  Reports cough is keeping him up at night, has significant sore muscles from constantly coughing.  Endorses significant history of recurrent sinus infections.  Has allergies to penicillin and mycin medications.     Observations/Objective:  Reviewed history,current meds, no physical exam completed     Past Medical History:  Diagnosis Date  . Arthritis   . Asthma   . Claudication ([place]) 04/02/2017  . Coronary artery disease involving native coronary artery of native heart without angina pectoris 04/02/2017  . Essential hypertension 03/07/2017  . Glucose intolerance (impaired glucose tolerance) 03/15/2017  . Hypertension   . Myocardial infarction ([place])   . Paresthesia of lower extremity 04/02/2017  . Pure hypercholesterolemia 03/07/2017  . Snoring 03/13/2018  . Tobacco abuse 03/07/2017    Past Surgical History:  Procedure Laterality Date  . CARDIAC CATHETERIZATION    . cardiac stents    . CORONARY ANGIOPLASTY    . LEFT HEART CATH AND CORONARY  ANGIOGRAPHY N/A 07/11/2018   Procedure: LEFT HEART CATH AND CORONARY ANGIOGRAPHY;  Surgeon: Nelva Bush, MD;  Location: [facility];  Service: Cardiovascular;  Laterality: N/A;    Family History  Problem Relation Age of Onset  . Hypertension Mother   . Heart attack Mother   . Heart disease Mother   . Hypertension Father     Social History   Socioeconomic History  . Marital status: Married    Spouse name: Not on file  . Number of children: Not on file  . Years of education: Not on file  . Highest education level: Not on file  Occupational History  . Not on file  Tobacco Use  . Smoking status: Current Every Day Smoker    Types: Cigarettes  . Smokeless tobacco: Former Systems developer    Types: Chew    Quit date: 2000  . Tobacco comment: 4 cigarettes   Substance and Sexual Activity  . Alcohol use: Not Currently  . Drug use: Not Currently  . Sexual activity: Yes  Other Topics Concern  . Not on file  Social History Narrative  . Not on file   Social Determinants of Health   Financial Resource Strain:   . Difficulty of Paying Living Expenses:   Food Insecurity:   . Worried About Charity fundraiser in the Last Year:   . Arboriculturist in the Last Year:   Transportation Needs:   . Film/video editor (Medical):   Marland Kitchen Lack of Transportation (Non-Medical):   Physical Activity:   . Days of Exercise per Week:   . Minutes of Exercise per Session:  Stress:   . Feeling of Stress :   Social Connections:   . Frequency of Communication with Friends and Family:   . Frequency of Social Gatherings with Friends and Family:   . Attends Religious Services:   . Active Member of Clubs or Organizations:   . Attends Archivist Meetings:   Marland Kitchen Marital Status:   Intimate Partner Violence:   . Fear of Current or Ex-Partner:   . Emotionally Abused:   Marland Kitchen Physically Abused:   . Sexually Abused:     Outpatient Medications Prior to Visit  Medication Sig Dispense Refill  .  albuterol (VENTOLIN HFA) 108 (90 Base) MCG/ACT inhaler Inhale 1-2 puffs into the lungs every 6 (six) hours as needed for wheezing or shortness of breath. 18 g 1  . amLODipine (NORVASC) 10 MG tablet Take 1 tablet (10 mg total) by mouth daily. 90 tablet 1  . atorvastatin (LIPITOR) 40 MG tablet Take 1 tablet (40 mg total) by mouth at bedtime. 90 tablet 1  . Blood Glucose Monitoring Suppl (TRUE METRIX METER) w/Device KIT Use as instructed 1 kit 0  . chlorhexidine (PERIDEX) 0.12 % solution Use as directed 15 mLs in the mouth or throat 2 (two) times daily. 473 mL 1  . clopidogrel (PLAVIX) 75 MG tablet Take 1 tablet (75 mg total) by mouth daily. 90 tablet 0  . gabapentin (NEURONTIN) 400 MG capsule Take 2 capsules (800 mg total) by mouth 3 (three) times daily. 90 capsule 3  . glucose blood (TRUE METRIX BLOOD GLUCOSE TEST) test strip Use as instructed. Check blood glucose level by fingerstick twice per day.  E11.65 100 each 12  . Insulin Pen Needle (B-D UF III MINI PEN NEEDLES) 31G X 5 MM MISC Use as instructed. Inject into the skin once nightly. 90 each 1  . levofloxacin (LEVAQUIN) 750 MG tablet Take 1 tablet (750 mg total) by mouth daily. 10 tablet 0  . losartan-hydrochlorothiazide (HYZAAR) 100-25 MG tablet Take 1 tablet by mouth daily. 90 tablet 3  . metFORMIN (GLUCOPHAGE) 500 MG tablet Take 2 tablets (1,000 mg total) by mouth 2 (two) times daily with a meal. 360 tablet 0  . metoprolol tartrate (LOPRESSOR) 25 MG tablet Take 0.5 tablets (12.5 mg total) by mouth 2 (two) times daily. 60 tablet 3  . PARoxetine (PAXIL) 10 MG tablet Take 10 mg by mouth daily.    Marland Kitchen PRAZOSIN HCL PO Take 1 mg by mouth at bedtime.    . ramelteon (ROZEREM) 8 MG tablet Take 8 mg by mouth at bedtime.    Marland Kitchen tiZANidine (ZANAFLEX) 4 MG tablet Take 1 tablet (4 mg total) by mouth every 6 (six) hours as needed for muscle spasms. 30 tablet 1  . TRUEplus Lancets 28G MISC Use as instructed. Check blood glucose level by fingerstick twice per  day.  E11.65 100 each 3  . acetaminophen (TYLENOL) 500 MG tablet Take 2 tablets (1,000 mg total) by mouth 3 (three) times daily. (Patient not taking: Reported on 09/29/2019) 30 tablet 0  . buPROPion (WELLBUTRIN SR) 200 MG 12 hr tablet Take 1 tablet (200 mg total) by mouth 2 (two) times daily. 60 tablet 3  . furosemide (LASIX) 40 MG tablet Take 1 tablet (40 mg total) by mouth daily. (Patient not taking: Reported on 09/02/2019) 30 tablet 5  . Insulin Glargine (LANTUS) 100 UNIT/ML Solostar Pen Inject 30 Units into the skin daily at 10 pm. 15 mL 11  . nitroGLYCERIN (NITROSTAT) 0.4 MG SL tablet Place  1 tablet (0.4 mg total) under the tongue every 5 (five) minutes as needed for chest pain. (Patient not taking: Reported on 11/18/2018) 25 tablet 11  . SUMAtriptan (IMITREX) 100 MG tablet Take 1 tablet (100 mg total) by mouth every 2 (two) hours as needed for migraine (Max 2 tablets daily). Repeat in 2 hrs if headache persists (Patient not taking: Reported on 09/29/2019) 9 tablet 2  . traZODone (DESYREL) 150 MG tablet Take 1 tablet (150 mg total) by mouth at bedtime. 90 tablet 2  . metroNIDAZOLE (FLAGYL) 500 MG tablet Take 1 tablet (500 mg total) by mouth 3 (three) times daily. 21 tablet 0   No facility-administered medications prior to visit.    Allergies  Allergen Reactions  . Asa [Aspirin] Anaphylaxis  . Lemon Flavor Anaphylaxis    Throat swell up.   . Other Anaphylaxis and Swelling    Pickle  . Penicillins Anaphylaxis and Rash    Has patient had a PCN reaction causing immediate rash, facial/tongue/throat swelling, SOB or lightheadedness with hypotension: Yes Has patient had a PCN reaction causing severe rash involving mucus membranes or skin necrosis: No Has patient had a PCN reaction that required hospitalization: reaction occurred in the hospital Has patient had a PCN reaction occurring within the last 10 years: Yes If all of the above answers are "NO", then may proceed with Cephalosporin use.  Marland Kitchen  Lisinopril     Kidney failure  . Clindamycin Rash  . Erythromycin Rash    Review of Systems  Constitutional: Positive for chills, fatigue and fever.  HENT: Positive for congestion, postnasal drip, sinus pressure, sinus pain and sneezing.   Eyes: Negative.   Respiratory: Positive for cough and chest tightness.   Cardiovascular: Negative.   Gastrointestinal: Negative.   Endocrine: Negative.   Genitourinary: Negative.   Musculoskeletal: Negative.   Skin: Negative.   Allergic/Immunologic: Positive for environmental allergies.  Neurological: Negative.   Hematological: Negative.   Psychiatric/Behavioral: Negative.        Objective:     There were no vitals taken for this visit. Wt Readings from Last 3 Encounters:  09/29/19 (!) 349 lb (158.3 kg)  12/25/18 (!) 375 lb 3.2 oz (170.2 kg)  11/18/18 (!) 370 lb (167.8 kg)    Health Maintenance Due  Topic Date Due  . COVID-19 Vaccine (1) Never done  . TETANUS/TDAP  Never done    There are no preventive care reminders to display for this patient.   No results found for: TSH Lab Results  Component Value Date   WBC 11.1 (H) 09/29/2019   HGB 14.8 09/29/2019   HCT 45.0 09/29/2019   MCV 86.5 09/29/2019   PLT 293 09/29/2019   Lab Results  Component Value Date   NA 137 09/29/2019   K 3.7 09/29/2019   CO2 27 09/29/2019   GLUCOSE 260 (H) 09/29/2019   BUN 12 09/29/2019   CREATININE 0.67 09/29/2019   BILITOT 0.7 09/29/2019   ALKPHOS 65 09/29/2019   AST 19 09/29/2019   ALT 24 09/29/2019   PROT 6.5 09/29/2019   ALBUMIN 3.6 09/29/2019   CALCIUM 8.7 (L) 09/29/2019   ANIONGAP 9 09/29/2019   Lab Results  Component Value Date   CHOL 133 09/02/2019   Lab Results  Component Value Date   HDL 26 (L) 09/02/2019   Lab Results  Component Value Date   LDLCALC 72 09/02/2019   Lab Results  Component Value Date   TRIG 212 (H) 09/02/2019   Lab Results  Component Value Date   CHOLHDL 5.1 (H) 09/02/2019   Lab Results   Component Value Date   HGBA1C 11.9 (H) 09/02/2019       Assessment & Plan:   1. Chronic frontal sinusitis Gave patient education on avoiding NyQuil due to hypertension, appropriate over-the-counter medications to use.  Encourage patient to be tested for Covid.  Explained to patient that this was a virtual visit, I am unable to rule out pneumonia or COVID-19, will have follow-up on Thursday, patient requests virtual.  - doxycycline (VIBRA-TABS) 100 MG tablet; Take 1 tablet (100 mg total) by mouth 2 (two) times daily.  Dispense: 20 tablet; Refill: 0  2. Cough  - doxycycline (VIBRA-TABS) 100 MG tablet; Take 1 tablet (100 mg total) by mouth 2 (two) times daily.  Dispense: 20 tablet; Refill: 0   Follow Up Instructions:    I discussed the assessment and treatment plan with the patient. The patient was provided an opportunity to ask questions and all were answered. The patient agreed with the plan and demonstrated an understanding of the instructions.   The patient was advised to call back or seek an in-person evaluation if the symptoms worsen or if the condition fails to improve as anticipated.  I provided 25 minutes of non-face-to-face time during this encounter.   Loraine Grip Mayers, PA-C

## 2020-01-26 NOTE — Patient Instructions (Addendum)
Because we had a virtual visit, I am unable to rule out COVID-19 or pneumonia.  I recommend that you do have of Covid 19 test completed.  We will follow-up again on Thursday at 10 AM virtually.  I am enclosing information regarding a upper respiratory infection and warning signs for prompt evaluation at an emergency department.  You will take doxycycline 100 mg twice a day for 10 days, as I mentioned during our telephone visit, make sure to stay upright for half hour after taking the tablet and drink lots of water with the tablet.  I sent Tylenol 3 to help you rest due to the pain you are having from the cough.  It is only available in tablet form at [facility], it is the same formulation as if you were taking the syrup.  Once again, please avoid over-the-counter decongestants including NyQuil, Sudafed, etc.  One that is safe for people with high blood pressure is Coricidin.  I hope that you feel better soon, if you need [place] before Thursday please let us know.  Kennieth Rad, PA-C Physician Assistant [facility] [email or web address]    Upper Respiratory Infection, Adult An upper respiratory infection (URI) affects the nose, throat, and upper air passages. URIs are caused by germs (viruses). The most common type of URI is often called "the common cold." Medicines cannot cure URIs, but you can do things at home to relieve your symptoms. URIs usually get better within 7-10 days. Follow these instructions at home: Activity  Rest as needed.  If you have a fever, stay home from work or school until your fever is gone, or until your doctor says you may return to work or school. ? You should stay home until you cannot spread the infection anymore (you are not contagious). ? Your doctor may have you wear a face mask so you have less risk of spreading the infection. Relieving symptoms  Gargle with a salt-water mixture  3-4 times a day or as needed. To make a salt-water mixture, completely dissolve -1 tsp of salt in 1 cup of warm water.  Use a cool-mist humidifier to add moisture to the air. This can help you breathe more easily. Eating and drinking   Drink enough fluid to keep your pee (urine) pale yellow.  Eat soups and other clear broths. General instructions   Take over-the-counter and prescription medicines only as told by your doctor. These include cold medicines, fever reducers, and cough suppressants.  Do not use any products that contain nicotine or tobacco. These include cigarettes and e-cigarettes. If you need help quitting, ask your doctor.  Avoid being where people are smoking (avoid secondhand smoke).  Make sure you get regular shots and get the flu shot every year.  Keep all follow-up visits as told by your doctor. This is important. How to avoid spreading infection to others   Wash your hands often with soap and water. If you do not have soap and water, use hand sanitizer.  Avoid touching your mouth, face, eyes, or nose.  Cough or sneeze into a tissue or your sleeve or elbow. Do not cough or sneeze into your hand or into the air. Contact a doctor if:  You are getting worse, not better.  You have any of these: ? A fever. ? Chills. ? Brown or red mucus in your nose. ? Yellow or brown fluid (discharge)coming from your nose. ? Pain in your face, especially when you bend forward. ?  Swollen neck glands. ? Pain with swallowing. ? White areas in the back of your throat. Get help right away if:  You have shortness of breath that gets worse.  You have very bad or constant: ? Headache. ? Ear pain. ? Pain in your forehead, behind your eyes, and over your cheekbones (sinus pain). ? Chest pain.  You have long-lasting (chronic) lung disease along with any of these: ? Wheezing. ? Long-lasting cough. ? Coughing up blood. ? A change in your usual mucus.  You have a stiff  neck.  You have changes in your: ? Vision. ? Hearing. ? Thinking. ? Mood. Summary  An upper respiratory infection (URI) is caused by a germ called a virus. The most common type of URI is often called "the common cold."  URIs usually get better within 7-10 days.  Take over-the-counter and prescription medicines only as told by your doctor. This information is not intended to replace advice given to you by your health care provider. Make sure you discuss any questions you have with your health care provider. Document Revised: 09/12/2018 Document Reviewed: 04/27/2017 Elsevier Patient Education  2020 [employer].

## 2020-01-29 ENCOUNTER — Ambulatory Visit: Payer: Self-pay

## 2020-02-26 ENCOUNTER — Other Ambulatory Visit: Payer: Self-pay

## 2020-02-26 ENCOUNTER — Ambulatory Visit (INDEPENDENT_AMBULATORY_CARE_PROVIDER_SITE_OTHER): Payer: Self-pay | Admitting: Nurse Practitioner

## 2020-02-26 ENCOUNTER — Encounter: Payer: Self-pay | Admitting: Nurse Practitioner

## 2020-02-26 VITALS — BP 151/105 | HR 81 | Temp 98.1°F | Ht 75.0 in | Wt 349.1 lb

## 2020-02-26 DIAGNOSIS — I252 Old myocardial infarction: Secondary | ICD-10-CM

## 2020-02-26 DIAGNOSIS — I10 Essential (primary) hypertension: Secondary | ICD-10-CM

## 2020-02-26 DIAGNOSIS — E0865 Diabetes mellitus due to underlying condition with hyperglycemia: Secondary | ICD-10-CM

## 2020-02-26 DIAGNOSIS — R609 Edema, unspecified: Secondary | ICD-10-CM

## 2020-02-26 DIAGNOSIS — F329 Major depressive disorder, single episode, unspecified: Secondary | ICD-10-CM

## 2020-02-26 DIAGNOSIS — IMO0002 Reserved for concepts with insufficient information to code with codable children: Secondary | ICD-10-CM

## 2020-02-26 DIAGNOSIS — R600 Localized edema: Secondary | ICD-10-CM

## 2020-02-26 DIAGNOSIS — H5462 Unqualified visual loss, left eye, normal vision right eye: Secondary | ICD-10-CM

## 2020-02-26 DIAGNOSIS — E08319 Diabetes mellitus due to underlying condition with unspecified diabetic retinopathy without macular edema: Secondary | ICD-10-CM

## 2020-02-26 DIAGNOSIS — Z0000 Encounter for general adult medical examination without abnormal findings: Secondary | ICD-10-CM

## 2020-02-26 DIAGNOSIS — G4733 Obstructive sleep apnea (adult) (pediatric): Secondary | ICD-10-CM

## 2020-02-26 LAB — POCT URINALYSIS DIPSTICK
Bilirubin, UA: NEGATIVE
Glucose, UA: POSITIVE — AB
Ketones, UA: NEGATIVE
Leukocytes, UA: NEGATIVE
Nitrite, UA: NEGATIVE
Protein, UA: POSITIVE — AB
Spec Grav, UA: 1.025 (ref 1.010–1.025)
Urobilinogen, UA: 0.2 E.U./dL
pH, UA: 5.5 (ref 5.0–8.0)

## 2020-02-26 LAB — POCT GLYCOSYLATED HEMOGLOBIN (HGB A1C)
HbA1c POC (<> result, manual entry): 8.6 % (ref 4.0–5.6)
HbA1c, POC (controlled diabetic range): 8.6 % — AB (ref 0.0–7.0)
HbA1c, POC (prediabetic range): 8.6 % — AB (ref 5.7–6.4)
Hemoglobin A1C: 8.6 % — AB (ref 4.0–5.6)

## 2020-02-26 LAB — GLUCOSE, POCT (MANUAL RESULT ENTRY): POC Glucose: 231 mg/dl — AB (ref 70–99)

## 2020-02-26 MED ORDER — METOPROLOL TARTRATE 25 MG PO TABS: 25.0000 mg | ORAL_TABLET | Freq: Two times a day (BID) | ORAL | 2 refills | Status: DC

## 2020-02-26 MED ORDER — FUROSEMIDE 40 MG PO TABS: 40.0000 mg | ORAL_TABLET | Freq: Every day | ORAL | 0 refills | Status: DC | PRN

## 2020-02-26 MED ORDER — HYDROXYZINE HCL 25 MG PO TABS: 25.0000 mg | ORAL_TABLET | Freq: Three times a day (TID) | ORAL | 0 refills | Status: AC | PRN

## 2020-02-26 MED ORDER — PAROXETINE HCL 20 MG PO TABS: 20.0000 mg | ORAL_TABLET | Freq: Every day | ORAL | 2 refills | Status: DC

## 2020-02-26 MED ORDER — BLOOD GLUCOSE METER KIT: PACK | 0 refills | Status: DC

## 2020-02-26 NOTE — Progress Notes (Signed)
Jeffrey Barrera, [place]  [address] Phone:  [phone]   Fax:  [phone]    New Patient Office Visit  Subjective:  Patient ID: Jeffrey Fessel., male    DOB: [birth year]  Age: 39 y.o. MRN: [id number]  CC:  Chief Complaint  Patient presents with  . Establish Care    Feels like health is degrading;unable to sleep only gets 3-4hours/day  . Hypertension    uses BP cuff at home3-4x/day; spikes throughout the day  . migraines    Lasting all day;impaired vision unable to see completly left eye    HPI Jeffrey Barrera. presents establish care. He has an extensive medical history and he has some overall concerns. He  has a past medical history of Anxiety, Arthritis, Asthma, Blind left eye, Claudication ([place]) (04/02/2017), Coronary artery disease involving native coronary artery of native heart without angina pectoris (04/02/2017), Depression, Essential hypertension (03/07/2017), Glucose intolerance (impaired glucose tolerance) (03/15/2017), Hypertension, Myocardial infarction ([place]), Paresthesia of lower extremity (04/02/2017), Pure hypercholesterolemia (03/07/2017), Snoring (03/13/2018), and Tobacco abuse (03/07/2017).   Diabetes Mellitus He is establishing care a needs to  follow up of diabetes. Current symptoms include: hyperglycemia and visual disturbances. Symptoms have stabilized. He his having weight loss but this is intentional. His wieght goal is 275 pounds.  Patient denies foot ulcerations, hypoglycemia , increased appetite, nausea, polydipsia, polyuria and vomiting. Evaluation to date has included: fasting blood sugar, fasting lipid panel and hemoglobin A1C.  Home sugars: patient does not check sugars. Current treatment: more intensive attention to diet which has been effective, weight loss of 50 lbs which has been effective, this weight loss have been over the a last 18 months.  Continued insulin which has been effective, Continued metformin which has  been effective and Continued statin which has been effective. Last dilated eye exam: unknown. He has left eye vision loss and admits that his right eye is getting weaker. He is only able to see specific areas.  He admits that he has limitation with access to care because he has not received his disability or medicaid.   Depression Patient complains of depression. He complains of depressed mood and insomnia. Onset was approximately several years ago. Symptoms have been gradually worsening since that time. Current symptoms include: depressed mood, hopelessness, insomnia, weight gain and weight loss. Patient denies impaired memory, recurrent thoughts of death, suicidal attempt and suicidal thoughts with specific plan. Family history significant for psychiatric illness is unknown. Possible organic causes contributing are: drug abuse. Risk factors: previous episode of depression and decline in health. Previous treatment includes individual therapy and medication. He complains of the following side effects from the treatment: not being effecitve.  He is currently on hydroxyzine  10 mg and Paxil 10 mg. He was be seen at [facility] but states that he was recently sent a letter that this was being taken over by another company. He admits that his faith is strong.  Hypertension Patient is here for follow-up of elevated blood pressure. He is not exercising and is adherent to a low-salt diet. He is not able to exercise because of his vision loss and has multiple falls. He is requesting assistance with handicap bars for his home. Blood pressure varies at home. Cardiac symptoms: paroxysmal nocturnal dyspnea. Patient denies chest pain, dyspnea, irregular heart beat, palpitations and syncope. Cardiovascular risk factors: diabetes mellitus, dyslipidemia, male gender and obesity (BMI >= 30 kg/m2). Use of agents associated with hypertension: none. History  of target organ damage: retinopathy and CAD along with PVD. He is SP PCI   03/01/17.  Sleep Apnea . He complains of snoring and feels like he is choking. He admits to periods of apnea and having to be deep stimulation to wake him up"beating on him" .  Symptoms began several years ago. rapidly worsening since that time.  Previous evaluation and treatment has included sleep study and medications for insomnia and nightmares. He also contributes his inability to sleep due to his back pain.  Past Medical History:  Diagnosis Date  . Anxiety   . Arthritis   . Asthma   . Blind left eye   . Claudication ([place]) 04/02/2017  . Coronary artery disease involving native coronary artery of native heart without angina pectoris 04/02/2017  . Depression   . Essential hypertension 03/07/2017  . Glucose intolerance (impaired glucose tolerance) 03/15/2017  . Hypertension   . Myocardial infarction ([place])   . Paresthesia of lower extremity 04/02/2017  . Pure hypercholesterolemia 03/07/2017  . Snoring 03/13/2018  . Tobacco abuse 03/07/2017    Past Surgical History:  Procedure Laterality Date  . CARDIAC CATHETERIZATION    . cardiac stents    . CORONARY ANGIOPLASTY    . LEFT HEART CATH AND CORONARY ANGIOGRAPHY N/A 07/11/2018   Procedure: LEFT HEART CATH AND CORONARY ANGIOGRAPHY;  Surgeon: Nelva Bush, MD;  Location: [facility];  Service: Cardiovascular;  Laterality: N/A;    Family History  Problem Relation Age of Onset  . Hypertension Mother   . Heart attack Mother   . Heart disease Mother   . Hypertension Father     Social History   Socioeconomic History  . Marital status: Married    Spouse name: Not on file  . Number of children: Not on file  . Years of education: Not on file  . Highest education level: Not on file  Occupational History  . Not on file  Tobacco Use  . Smoking status: Current Every Day Smoker    Types: Cigarettes  . Smokeless tobacco: Former Systems developer    Types: Chew    Quit date: 2000  . Tobacco comment: 4 cigarettes   Vaping Use  . Vaping Use:  Never used  Substance and Sexual Activity  . Alcohol use: Not Currently  . Drug use: Yes    Types: Marijuana  . Sexual activity: Yes  Other Topics Concern  . Not on file  Social History Narrative  . Not on file   Social Determinants of Health   Financial Resource Strain:   . Difficulty of Paying Living Expenses:   Food Insecurity:   . Worried About Charity fundraiser in the Last Year:   . Arboriculturist in the Last Year:   Transportation Needs:   . Film/video editor (Medical):   Marland Kitchen Lack of Transportation (Non-Medical):   Physical Activity:   . Days of Exercise per Week:   . Minutes of Exercise per Session:   Stress:   . Feeling of Stress :   Social Connections:   . Frequency of Communication with Friends and Family:   . Frequency of Social Gatherings with Friends and Family:   . Attends Religious Services:   . Active Member of Clubs or Organizations:   . Attends Archivist Meetings:   Marland Kitchen Marital Status:   Intimate Partner Violence:   . Fear of Current or Ex-Partner:   . Emotionally Abused:   Marland Kitchen Physically Abused:   .  Sexually Abused:     ROS Review of Systems  Constitutional:       He has started back smoking Mar[place]ana to give him some relief with his head He admits that he had heavy use as a teenager 18 mons  Cigarettes 4 per day he is down form pak to 1/2 pack per day.   He is anxious    Eyes: Positive for visual disturbance.       OS 75yr He had vision problems while on linsinopril after having side effect    Neurological: Positive for headaches.       Headches 24 hours per day     Objective:   Today's Vitals: BP (!) 151/105 (BP Location: Left Arm)   Pulse 81   Temp 98.1 F (36.7 C)   Ht _0  (1.905 m)   Wt (!) 349 lb 0.8 oz (158.3 kg)   SpO2 99%   BMI 43.63 kg/m   Physical Exam Constitutional:      General: He is not in acute distress.    Appearance: He is obese. He is not toxic-appearing.  HENT:     Head: Normocephalic  and atraumatic.     Mouth/Throat:     Mouth: Mucous membranes are moist.     Pharynx: Oropharynx is clear.  Eyes:     Extraocular Movements: Extraocular movements intact.     Conjunctiva/sclera: Conjunctivae normal.  Cardiovascular:     Rate and Rhythm: Normal rate and regular rhythm.     Pulses: Normal pulses.     Heart sounds: Normal heart sounds.  Pulmonary:     Effort: Pulmonary effort is normal.     Breath sounds: Normal breath sounds.  Abdominal:     General: Bowel sounds are normal.  Musculoskeletal:        General: Normal range of motion.     Cervical back: Normal range of motion.     Right lower leg: Edema present.     Left lower leg: Edema present.     Comments: Trace-1+  Skin:    General: Skin is warm and dry.  Neurological:     General: No focal deficit present.     Mental Status: He is alert and oriented to person, place, and time.  Psychiatric:        Mood and Affect: Mood normal.        Behavior: Behavior normal.        Thought Content: Thought content normal.        Judgment: Judgment normal.     Assessment & Plan:   Assessment  Primary Diagnosis & Pertinent Problem List: The primary encounter diagnosis was Essential hypertension. Diagnoses of Obstructive sleep apnea syndrome, History of MI (myocardial infarction), Healthcare maintenance, Peripheral edema, Vision loss of left eye, Major depression, chronic, Uncontrolled diabetes mellitus due to underlying condition with diabetic retinopathy ([place], and Localized edema were also pertinent to this visit.  Visit Diagnosis: 1. Essential hypertension   2. Obstructive sleep apnea syndrome   3. History of MI (myocardial infarction)   4. Healthcare maintenance   5. Peripheral edema   6. Vision loss of left eye   7. Major depression, chronic   8. Uncontrolled diabetes mellitus due to underlying condition with diabetic retinopathy ([place]   9. Localized edema    Problem List Items Addressed This Visit       Cardiovascular and Mediastinum   Essential hypertension - Primary   Relevant Medications   metoprolol tartrate (LOPRESSOR) 25 MG  tablet   Other Relevant Orders   Urinalysis Dipstick (Completed)     Respiratory   Sleep apnea   Relevant Orders   Split night study    Other Visit Diagnoses    History of MI (myocardial infarction)       Relevant Medications   metoprolol tartrate (LOPRESSOR) 25 MG tablet       Outpatient Encounter Medications as of 02/26/2020  Medication Sig  . albuterol (VENTOLIN HFA) 108 (90 Base) MCG/ACT inhaler Inhale 1-2 puffs into the lungs every 6 (six) hours as needed for wheezing or shortness of breath.  Marland Kitchen amLODipine (NORVASC) 10 MG tablet Take 1 tablet (10 mg total) by mouth daily.  Marland Kitchen atorvastatin (LIPITOR) 40 MG tablet Take 1 tablet (40 mg total) by mouth at bedtime.  . Blood Glucose Monitoring Suppl (TRUE METRIX METER) w/Device KIT Use as instructed  . chlorhexidine (PERIDEX) 0.12 % solution Use as directed 15 mLs in the mouth or throat 2 (two) times daily.  . clopidogrel (PLAVIX) 75 MG tablet Take 1 tablet (75 mg total) by mouth daily.  . furosemide (LASIX) 40 MG tablet Take 1 tablet (40 mg total) by mouth daily as needed for edema.  . gabapentin (NEURONTIN) 400 MG capsule Take 2 capsules (800 mg total) by mouth 3 (three) times daily.  Marland Kitchen glucose blood (TRUE METRIX BLOOD GLUCOSE TEST) test strip Use as instructed. Check blood glucose level by fingerstick twice per day.  E11.65  . losartan-hydrochlorothiazide (HYZAAR) 100-25 MG tablet Take 1 tablet by mouth daily.  Marland Kitchen PRAZOSIN HCL PO Take 1 mg by mouth at bedtime.  . ramelteon (ROZEREM) 8 MG tablet Take 8 mg by mouth at bedtime.  Marland Kitchen tiZANidine (ZANAFLEX) 4 MG tablet Take 1 tablet (4 mg total) by mouth every 6 (six) hours as needed for muscle spasms.  . TRUEplus Lancets 28G MISC Use as instructed. Check blood glucose level by fingerstick twice per day.  E11.65  . [DISCONTINUED] furosemide (LASIX) 40 MG tablet  Take 1 tablet (40 mg total) by mouth daily.  . [DISCONTINUED] PARoxetine (PAXIL) 10 MG tablet Take 10 mg by mouth daily.  . blood glucose meter kit and supplies Dispense based on patient and insurance preference. Use up to four times daily as directed. (FOR ICD-10 E10.9, E11.9).  Marland Kitchen buPROPion (WELLBUTRIN SR) 200 MG 12 hr tablet Take 1 tablet (200 mg total) by mouth 2 (two) times daily.  . hydrOXYzine (ATARAX/VISTARIL) 25 MG tablet Take 1 tablet (25 mg total) by mouth 3 (three) times daily as needed.  . Insulin Glargine (LANTUS) 100 UNIT/ML Solostar Pen Inject 30 Units into the skin daily at 10 pm.  . Insulin Pen Needle (B-D UF III MINI PEN NEEDLES) 31G X 5 MM MISC Use as instructed. Inject into the skin once nightly.  . metFORMIN (GLUCOPHAGE) 500 MG tablet Take 2 tablets (1,000 mg total) by mouth 2 (two) times daily with a meal.  . metoprolol tartrate (LOPRESSOR) 25 MG tablet Take 1 tablet (25 mg total) by mouth 2 (two) times daily.  . nitroGLYCERIN (NITROSTAT) 0.4 MG SL tablet Place 1 tablet (0.4 mg total) under the tongue every 5 (five) minutes as needed for chest pain. (Patient not taking: Reported on 11/18/2018)  . PARoxetine (PAXIL) 20 MG tablet Take 1 tablet (20 mg total) by mouth daily.  . SUMAtriptan (IMITREX) 100 MG tablet Take 1 tablet (100 mg total) by mouth every 2 (two) hours as needed for migraine (Max 2 tablets daily). Repeat in 2 hrs  if headache persists (Patient not taking: Reported on 09/29/2019)  . traZODone (DESYREL) 150 MG tablet Take 1 tablet (150 mg total) by mouth at bedtime.  . [DISCONTINUED] acetaminophen (TYLENOL) 500 MG tablet Take 2 tablets (1,000 mg total) by mouth 3 (three) times daily. (Patient not taking: Reported on 09/29/2019)  . [DISCONTINUED] doxycycline (VIBRA-TABS) 100 MG tablet Take 1 tablet (100 mg total) by mouth 2 (two) times daily.  . [DISCONTINUED] levofloxacin (LEVAQUIN) 750 MG tablet Take 1 tablet (750 mg total) by mouth daily.  . [DISCONTINUED] metoprolol  tartrate (LOPRESSOR) 25 MG tablet Take 0.5 tablets (12.5 mg total) by mouth 2 (two) times daily.   No facility-administered encounter medications on file as of 02/26/2020.    Follow-up: Return in about 6 weeks (around 04/08/2020).   Vevelyn Francois, NP

## 2020-02-27 ENCOUNTER — Telehealth: Payer: Self-pay | Admitting: Clinical

## 2020-02-27 NOTE — Telephone Encounter (Signed)
[facility]ated [facility]al [facility] Case Management Referral Note  02/27/2020 Name: Jeffrey Barrera. MRN: [id number] DOB: 09-13-1981 Jeffrey Barrera. is a 39 y.o. year old male who sees Gildardo Pounds, NP for primary care. LCSW was consulted to assess patient's needs and assist the patient with [employer]  and Financial Difficulties related to no income and inadequate health coverage.  Interpreter: No.   Interpreter Name & Language: none  Assessment: Patient referred by PCP after visit 02/26/20. Followed up by phone today. Patient experiencing Financial constraints related to no income and inadequate health coverage. Patient also in need of an eye exam. He does not have insurance coverage or a source of income. He is currently in the process of trying to obtain Medicaid and social security disability. He reports he is already unable to see out of one eye and worries he may need more than just eyeglasses. Patient is eligible to apply for [facility] [place] for a free eye exam with an optometrist and will return to [facility] next week to sign the application. CSW will assist with submitting application and with follow up.   Review of patient status, including review of consultants reports, relevant laboratory and other test results, and collaboration with appropriate care team members and the patient's provider was performed as part of comprehensive patient evaluation and provision of services.    SDOH (Social Determinants of Health) assessments performed: No  Outpatient Encounter Medications as of 02/27/2020  Medication Sig  . albuterol (VENTOLIN HFA) 108 (90 Base) MCG/ACT inhaler Inhale 1-2 puffs into the lungs every 6 (six) hours as needed for wheezing or shortness of breath.  Marland Kitchen amLODipine (NORVASC) 10 MG tablet Take 1 tablet (10 mg total) by mouth daily.  Marland Kitchen atorvastatin (LIPITOR) 40 MG tablet Take 1 tablet (40 mg total) by mouth at bedtime.  . blood glucose meter kit and supplies Dispense based on patient  and insurance preference. Use up to four times daily as directed. (FOR ICD-10 E10.9, E11.9).  Marland Kitchen Blood Glucose Monitoring Suppl (TRUE METRIX METER) w/Device KIT Use as instructed  . buPROPion (WELLBUTRIN SR) 200 MG 12 hr tablet Take 1 tablet (200 mg total) by mouth 2 (two) times daily.  . chlorhexidine (PERIDEX) 0.12 % solution Use as directed 15 mLs in the mouth or throat 2 (two) times daily.  . clopidogrel (PLAVIX) 75 MG tablet Take 1 tablet (75 mg total) by mouth daily.  . furosemide (LASIX) 40 MG tablet Take 1 tablet (40 mg total) by mouth daily as needed for edema.  . gabapentin (NEURONTIN) 400 MG capsule Take 2 capsules (800 mg total) by mouth 3 (three) times daily.  Marland Kitchen glucose blood (TRUE METRIX BLOOD GLUCOSE TEST) test strip Use as instructed. Check blood glucose level by fingerstick twice per day.  E11.65  . hydrOXYzine (ATARAX/VISTARIL) 25 MG tablet Take 1 tablet (25 mg total) by mouth 3 (three) times daily as needed.  . Insulin Glargine (LANTUS) 100 UNIT/ML Solostar Pen Inject 30 Units into the skin daily at 10 pm.  . Insulin Pen Needle (B-D UF III MINI PEN NEEDLES) 31G X 5 MM MISC Use as instructed. Inject into the skin once nightly.  Marland Kitchen losartan-hydrochlorothiazide (HYZAAR) 100-25 MG tablet Take 1 tablet by mouth daily.  . metFORMIN (GLUCOPHAGE) 500 MG tablet Take 2 tablets (1,000 mg total) by mouth 2 (two) times daily with a meal.  . metoprolol tartrate (LOPRESSOR) 25 MG tablet Take 1 tablet (25 mg total) by mouth 2 (two) times daily.  . nitroGLYCERIN (  NITROSTAT) 0.4 MG SL tablet Place 1 tablet (0.4 mg total) under the tongue every 5 (five) minutes as needed for chest pain. (Patient not taking: Reported on 11/18/2018)  . PARoxetine (PAXIL) 20 MG tablet Take 1 tablet (20 mg total) by mouth daily.  Marland Kitchen PRAZOSIN HCL PO Take 1 mg by mouth at bedtime.  . ramelteon (ROZEREM) 8 MG tablet Take 8 mg by mouth at bedtime.  . SUMAtriptan (IMITREX) 100 MG tablet Take 1 tablet (100 mg total) by mouth  every 2 (two) hours as needed for migraine (Max 2 tablets daily). Repeat in 2 hrs if headache persists (Patient not taking: Reported on 09/29/2019)  . tiZANidine (ZANAFLEX) 4 MG tablet Take 1 tablet (4 mg total) by mouth every 6 (six) hours as needed for muscle spasms.  . traZODone (DESYREL) 150 MG tablet Take 1 tablet (150 mg total) by mouth at bedtime.  . TRUEplus Lancets 28G MISC Use as instructed. Check blood glucose level by fingerstick twice per day.  E11.65   No facility-administered encounter medications on file as of 02/27/2020.    Goals Addressed   None      Follow up Plan: 1. Patient to return to [facility] to sign [facility] [place] application and CSW will assist with follow up on this.   Estanislado Emms, [facility] [phone]

## 2020-03-16 ENCOUNTER — Other Ambulatory Visit: Payer: Self-pay

## 2020-03-16 ENCOUNTER — Encounter: Payer: Self-pay | Admitting: Family Medicine

## 2020-03-16 ENCOUNTER — Ambulatory Visit (INDEPENDENT_AMBULATORY_CARE_PROVIDER_SITE_OTHER): Payer: Self-pay | Admitting: Family Medicine

## 2020-03-16 VITALS — BP 146/90 | HR 88 | Temp 98.2°F | Ht 75.0 in | Wt 347.0 lb

## 2020-03-16 DIAGNOSIS — R7303 Prediabetes: Secondary | ICD-10-CM

## 2020-03-16 DIAGNOSIS — E08319 Diabetes mellitus due to underlying condition with unspecified diabetic retinopathy without macular edema: Secondary | ICD-10-CM

## 2020-03-16 DIAGNOSIS — I10 Essential (primary) hypertension: Secondary | ICD-10-CM

## 2020-03-16 DIAGNOSIS — R609 Edema, unspecified: Secondary | ICD-10-CM

## 2020-03-16 DIAGNOSIS — R600 Localized edema: Secondary | ICD-10-CM

## 2020-03-16 DIAGNOSIS — R739 Hyperglycemia, unspecified: Secondary | ICD-10-CM

## 2020-03-16 DIAGNOSIS — E119 Type 2 diabetes mellitus without complications: Secondary | ICD-10-CM

## 2020-03-16 DIAGNOSIS — Z09 Encounter for follow-up examination after completed treatment for conditions other than malignant neoplasm: Secondary | ICD-10-CM

## 2020-03-16 DIAGNOSIS — J4520 Mild intermittent asthma, uncomplicated: Secondary | ICD-10-CM

## 2020-03-16 DIAGNOSIS — K648 Other hemorrhoids: Secondary | ICD-10-CM

## 2020-03-16 DIAGNOSIS — K644 Residual hemorrhoidal skin tags: Secondary | ICD-10-CM

## 2020-03-16 DIAGNOSIS — IMO0002 Reserved for concepts with insufficient information to code with codable children: Secondary | ICD-10-CM

## 2020-03-16 DIAGNOSIS — E0865 Diabetes mellitus due to underlying condition with hyperglycemia: Secondary | ICD-10-CM

## 2020-03-16 NOTE — Progress Notes (Signed)
Patient [facility]   Sick Visit  Subjective:  Patient ID: Jeffrey Begley., male    DOB: April 12, 1981  Age: 39 y.o. MRN: [id number]  CC:  Chief Complaint  Patient presents with  . Hemorrhoids    flared up 2 week ago; has used OTC with no relief. Was using Lidocaine with some relief. has sat in tub to get some relief.     HPI Jeffrey Barrera. is a 39 year old male who presents for Sick Visit today.    Patient Active Problem List   Diagnosis Date Noted  . Asthma   . Sleep apnea 03/13/2018  . Claudication in peripheral vascular disease ([place]) 04/02/2017  . Coronary artery disease involving native coronary artery of native heart without angina pectoris 04/02/2017  . Paresthesia of lower extremity 04/02/2017  . Glucose intolerance (impaired glucose tolerance) 03/15/2017  . Essential hypertension 03/07/2017  . Pure hypercholesterolemia 03/07/2017  . Tobacco abuse 03/07/2017    Past Medical History:  Diagnosis Date  . Anxiety   . Arthritis   . Asthma   . Blind left eye   . Claudication ([place]) 04/02/2017  . Coronary artery disease involving native coronary artery of native heart without angina pectoris 04/02/2017  . Depression   . Essential hypertension 03/07/2017  . Glucose intolerance (impaired glucose tolerance) 03/15/2017  . Hypertension   . Inflamed external hemorrhoid   . Inflamed internal hemorrhoid   . Internal and external bleeding hemorrhoids 2019  . Myocardial infarction ([place])   . Paresthesia of lower extremity 04/02/2017  . Pure hypercholesterolemia 03/07/2017  . Snoring 03/13/2018  . Tobacco abuse 03/07/2017    Current Status: This will be his initial office visit with me. He is previously seeing Dionisio David, NP for her PCP needs. Since his last office visit, he is doing well with no complaints. He denies fevers, chills, fatigue, recent infections, weight loss, and night sweats. He has not had any headaches, visual changes,  dizziness, and falls. No chest pain, heart palpitations, cough and shortness of breath reported. No reports of GI problems such as nausea, vomiting, diarrhea, and constipation. He has no reports of blood in stools, dysuria and hematuria. No depression or anxiety, and denies suicidal ideations, homicidal ideations, or auditory hallucinations. He is taking all medications as prescribed. He denies pain today.  He has complaints of recurrence of bleeding hemorrhoids. He continues to have increase anal pain on a scale of 8/10. He get mild relief from warm water/applecider soaks in the bathtub. He is also taking Acetaminophen and Preparation H creams and suppositories with minimal relief. Unable to sit in chair comfortably. He denies fevers, chills, fatigue, recent infections, weight loss, and night sweats. He has not had any headaches, visual changes, dizziness, and falls. No chest pain, heart palpitations, cough and shortness of breath reported. Denies GI problems such as nausea, vomiting, diarrhea, and constipation. He has no reports of blood in stools, dysuria and hematuria. No depression or anxiety, and denies suicidal ideations, homicidal ideations, or auditory hallucinations. He is taking all medications as prescribed. He denies pain today.   Past Surgical History:  Procedure Laterality Date  . CARDIAC CATHETERIZATION    . cardiac stents    . CORONARY ANGIOPLASTY    . LEFT HEART CATH AND CORONARY ANGIOGRAPHY N/A 07/11/2018   Procedure: LEFT HEART CATH AND CORONARY ANGIOGRAPHY;  Surgeon: Nelva Bush, MD;  Location: [facility];  Service: Cardiovascular;  Laterality: N/A;  Family History  Problem Relation Age of Onset  . Hypertension Mother   . Heart attack Mother   . Heart disease Mother   . Hypertension Father     Social History   Socioeconomic History  . Marital status: Married    Spouse name: Not on file  . Number of children: Not on file  . Years of education: Not on file   . Highest education level: Not on file  Occupational History  . Not on file  Tobacco Use  . Smoking status: Current Every Day Smoker    Types: Cigarettes  . Smokeless tobacco: Former Systems developer    Types: Chew    Quit date: 2000  . Tobacco comment: 4 cigarettes   Vaping Use  . Vaping Use: Never used  Substance and Sexual Activity  . Alcohol use: Not Currently  . Drug use: Yes    Types: Marijuana  . Sexual activity: Yes  Other Topics Concern  . Not on file  Social History Narrative  . Not on file   Social Determinants of Health   Financial Resource Strain:   . Difficulty of Paying Living Expenses:   Food Insecurity:   . Worried About Charity fundraiser in the Last Year:   . Arboriculturist in the Last Year:   Transportation Needs:   . Film/video editor (Medical):   Marland Kitchen Lack of Transportation (Non-Medical):   Physical Activity:   . Days of Exercise per Week:   . Minutes of Exercise per Session:   Stress:   . Feeling of Stress :   Social Connections:   . Frequency of Communication with Friends and Family:   . Frequency of Social Gatherings with Friends and Family:   . Attends Religious Services:   . Active Member of Clubs or Organizations:   . Attends Archivist Meetings:   Marland Kitchen Marital Status:   Intimate Partner Violence:   . Fear of Current or Ex-Partner:   . Emotionally Abused:   Marland Kitchen Physically Abused:   . Sexually Abused:     Outpatient Medications Prior to Visit  Medication Sig Dispense Refill  . albuterol (VENTOLIN HFA) 108 (90 Base) MCG/ACT inhaler Inhale 1-2 puffs into the lungs every 6 (six) hours as needed for wheezing or shortness of breath. 18 g 1  . amLODipine (NORVASC) 10 MG tablet Take 1 tablet (10 mg total) by mouth daily. 90 tablet 1  . atorvastatin (LIPITOR) 40 MG tablet Take 1 tablet (40 mg total) by mouth at bedtime. 90 tablet 1  . blood glucose meter kit and supplies Dispense based on patient and insurance preference. Use up to four times  daily as directed. (FOR ICD-10 E10.9, E11.9). 1 each 0  . Blood Glucose Monitoring Suppl (TRUE METRIX METER) w/Device KIT Use as instructed 1 kit 0  . chlorhexidine (PERIDEX) 0.12 % solution Use as directed 15 mLs in the mouth or throat 2 (two) times daily. 473 mL 1  . clopidogrel (PLAVIX) 75 MG tablet Take 1 tablet (75 mg total) by mouth daily. 90 tablet 0  . furosemide (LASIX) 40 MG tablet Take 1 tablet (40 mg total) by mouth daily as needed for edema. 15 tablet 0  . gabapentin (NEURONTIN) 400 MG capsule Take 2 capsules (800 mg total) by mouth 3 (three) times daily. 90 capsule 3  . glucose blood (TRUE METRIX BLOOD GLUCOSE TEST) test strip Use as instructed. Check blood glucose level by fingerstick twice per day.  E11.65 100  each 12  . hydrOXYzine (ATARAX/VISTARIL) 25 MG tablet Take 1 tablet (25 mg total) by mouth 3 (three) times daily as needed. 90 tablet 0  . Insulin Pen Needle (B-D UF III MINI PEN NEEDLES) 31G X 5 MM MISC Use as instructed. Inject into the skin once nightly. 90 each 1  . losartan-hydrochlorothiazide (HYZAAR) 100-25 MG tablet Take 1 tablet by mouth daily. 90 tablet 3  . metoprolol tartrate (LOPRESSOR) 25 MG tablet Take 1 tablet (25 mg total) by mouth 2 (two) times daily. 60 tablet 2  . PARoxetine (PAXIL) 20 MG tablet Take 1 tablet (20 mg total) by mouth daily. 30 tablet 2  . PRAZOSIN HCL PO Take 1 mg by mouth at bedtime.    . ramelteon (ROZEREM) 8 MG tablet Take 8 mg by mouth at bedtime.    Marland Kitchen tiZANidine (ZANAFLEX) 4 MG tablet Take 1 tablet (4 mg total) by mouth every 6 (six) hours as needed for muscle spasms. 30 tablet 1  . TRUEplus Lancets 28G MISC Use as instructed. Check blood glucose level by fingerstick twice per day.  E11.65 100 each 3  . buPROPion (WELLBUTRIN SR) 200 MG 12 hr tablet Take 1 tablet (200 mg total) by mouth 2 (two) times daily. 60 tablet 3  . Insulin Glargine (LANTUS) 100 UNIT/ML Solostar Pen Inject 30 Units into the skin daily at 10 pm. 15 mL 11  . metFORMIN  (GLUCOPHAGE) 500 MG tablet Take 2 tablets (1,000 mg total) by mouth 2 (two) times daily with a meal. 360 tablet 0  . nitroGLYCERIN (NITROSTAT) 0.4 MG SL tablet Place 1 tablet (0.4 mg total) under the tongue every 5 (five) minutes as needed for chest pain. (Patient not taking: Reported on 11/18/2018) 25 tablet 11  . SUMAtriptan (IMITREX) 100 MG tablet Take 1 tablet (100 mg total) by mouth every 2 (two) hours as needed for migraine (Max 2 tablets daily). Repeat in 2 hrs if headache persists (Patient not taking: Reported on 09/29/2019) 9 tablet 2  . traZODone (DESYREL) 150 MG tablet Take 1 tablet (150 mg total) by mouth at bedtime. 90 tablet 2   No facility-administered medications prior to visit.    Allergies  Allergen Reactions  . Asa [Aspirin] Anaphylaxis  . Lemon Flavor Anaphylaxis    Throat swell up.   . Other Anaphylaxis and Swelling    Pickle  . Penicillins Anaphylaxis and Rash    Has patient had a PCN reaction causing immediate rash, facial/tongue/throat swelling, SOB or lightheadedness with hypotension: Yes Has patient had a PCN reaction causing severe rash involving mucus membranes or skin necrosis: No Has patient had a PCN reaction that required hospitalization: reaction occurred in the hospital Has patient had a PCN reaction occurring within the last 10 years: Yes If all of the above answers are "NO", then may proceed with Cephalosporin use.  . Iodine   . Lisinopril     Kidney failure  . Clindamycin Rash  . Erythromycin Rash    ROS Review of Systems  Constitutional: Negative.   HENT: Negative.   Eyes: Negative.   Respiratory: Negative.   Cardiovascular: Negative.   Gastrointestinal:       Hemorrhoids  Endocrine: Negative.   Genitourinary: Negative.   Musculoskeletal: Negative.   Skin: Negative.   Allergic/Immunologic: Negative.   Neurological: Positive for dizziness (occasional ) and headaches (occasional ).  Hematological: Negative.   Psychiatric/Behavioral:  Negative.    Objective:    Physical Exam Vitals and nursing note reviewed.  Constitutional:  Appearance: Normal appearance.  HENT:     Head: Normocephalic and atraumatic.     Nose: Nose normal.     Mouth/Throat:     Mouth: Mucous membranes are moist.     Pharynx: Oropharynx is clear.  Cardiovascular:     Rate and Rhythm: Normal rate and regular rhythm.     Pulses: Normal pulses.     Heart sounds: Normal heart sounds.  Pulmonary:     Effort: Pulmonary effort is normal.     Breath sounds: Normal breath sounds.  Abdominal:     General: Bowel sounds are normal.     Palpations: Abdomen is soft.  Musculoskeletal:        General: Normal range of motion.     Cervical back: Normal range of motion and neck supple.  Skin:    General: Skin is warm and dry.  Neurological:     General: No focal deficit present.     Mental Status: He is alert and oriented to person, place, and time.  Psychiatric:        Mood and Affect: Mood normal.        Behavior: Behavior normal.        Thought Content: Thought content normal.        Judgment: Judgment normal.     BP (!) 146/90 (BP Location: Right Arm, Patient Position: Sitting, Cuff Size: Large)   Pulse 88   Temp 98.2 F (36.8 C)   Ht _0  (1.905 m)   Wt (!) 347 lb (157.4 kg)   SpO2 99%   BMI 43.37 kg/m  Wt Readings from Last 3 Encounters:  03/16/20 (!) 347 lb (157.4 kg)  02/26/20 (!) 349 lb 0.8 oz (158.3 kg)  09/29/19 (!) 349 lb (158.3 kg)     Health Maintenance Due  Topic Date Due  . Hepatitis C Screening  Never done  . COVID-19 Vaccine (1) Never done  . TETANUS/TDAP  Never done    There are no preventive care reminders to display for this patient.  No results found for: TSH Lab Results  Component Value Date   WBC 11.1 (H) 09/29/2019   HGB 14.8 09/29/2019   HCT 45.0 09/29/2019   MCV 86.5 09/29/2019   PLT 293 09/29/2019   Lab Results  Component Value Date   NA 137 09/29/2019   K 3.7 09/29/2019   CO2 27  09/29/2019   GLUCOSE 260 (H) 09/29/2019   BUN 12 09/29/2019   CREATININE 0.67 09/29/2019   BILITOT 0.7 09/29/2019   ALKPHOS 65 09/29/2019   AST 19 09/29/2019   ALT 24 09/29/2019   PROT 6.5 09/29/2019   ALBUMIN 3.6 09/29/2019   CALCIUM 8.7 (L) 09/29/2019   ANIONGAP 9 09/29/2019   Lab Results  Component Value Date   CHOL 133 09/02/2019   Lab Results  Component Value Date   HDL 26 (L) 09/02/2019   Lab Results  Component Value Date   LDLCALC 72 09/02/2019   Lab Results  Component Value Date   TRIG 212 (H) 09/02/2019   Lab Results  Component Value Date   CHOLHDL 5.1 (H) 09/02/2019   Lab Results  Component Value Date   HGBA1C 8.6 (A) 02/26/2020   HGBA1C 8.6 02/26/2020   HGBA1C 8.6 (A) 02/26/2020   HGBA1C 8.6 (A) 02/26/2020      Assessment & Plan:   1. Internal and external bleeding hemorrhoids We will refer her for possible Hemorrhoid Surgery at this time.  - Ambulatory referral to  General Surgery  2. Essential hypertension The current medical regimen is effective; blood pressure is stable at 146/90 today; continue present plan and medications as prescribed. He will continue to take medications as prescribed, to decrease high sodium intake, excessive alcohol intake, increase potassium intake, smoking cessation, and increase physical activity of at least 30 minutes of cardio activity daily. He will continue to follow Heart Healthy or DASH diet.  3. Uncontrolled diabetes mellitus due to underlying condition with diabetic retinopathy ([place])  4. Hemoglobin A1C between 7% and 9% indicating borderline diabetic control Hgb A1c is stable at 8.6 today, from 11.9 on 09/02/2019. Monitor.   5. Hyperglycemia  6. Mild intermittent asthma without complication Stable. No signs and symptoms of respiratory distress noted or reported today.   7. Peripheral edema Stable, continue Lasix as prescribed.   8. Follow up She will follow up with Dionisio David, NP.   No orders of the  defined types were placed in this encounter.   Orders Placed This Encounter  Procedures  . Ambulatory referral to General Surgery     Referral Orders     Ambulatory referral to [facility],  MSN, FNP-BC [facility] [address] [place], [place] [address] [phone] fax    Problem List Items Addressed This Visit      Cardiovascular and Mediastinum   Essential hypertension     Respiratory   Asthma    Other Visit Diagnoses    Internal and external bleeding hemorrhoids    -  Primary   Relevant Orders   Ambulatory referral to General Surgery   Uncontrolled diabetes mellitus due to underlying condition with diabetic retinopathy ([place])       Hemoglobin A1C between 7% and 9% indicating borderline diabetic control       Hyperglycemia       Peripheral edema       Follow up          No orders of the defined types were placed in this encounter.   Follow-up: No follow-ups on file.    Azzie Glatter, FNP

## 2020-03-29 ENCOUNTER — Telehealth: Payer: Self-pay | Admitting: Clinical

## 2020-03-29 NOTE — Telephone Encounter (Signed)
[facility] General Follow Up Note  03/29/2020 Name: Jeffrey Barrera. MRN: [id number] DOB: 10/21/1980 Jeffrey Barrera. is a 39 y.o. year old male who sees Claiborne Rigg, NP for primary care. LCSW was initially consulted to assess for need for assistance with eye exam.  Interpreter: No.   Interpreter Name & Language: none  Ongoing Intervention: Patient experiencing financial constraints related to no income and inadequate health coverage. Patient also in need of an eye exam. He does not have insurance coverage or a source of income. He is currently in the process of trying to obtain Medicaid and social security disability. Today CSW called patient and advised him of approval through [facility] for a free eye exam. Will mail patient the optometry information.   Review of patient status, including review of consultants reports, relevant laboratory and other test results, and collaboration with appropriate care team members and the patient's provider was performed as part of comprehensive patient evaluation and provision of services.     Follow up Plan: 1. Will send [facility] information by mail  Abigail Butts, LCSW [facility] [phone]

## 2020-04-01 ENCOUNTER — Other Ambulatory Visit (HOSPITAL_COMMUNITY): Payer: Self-pay

## 2020-04-03 ENCOUNTER — Ambulatory Visit (HOSPITAL_BASED_OUTPATIENT_CLINIC_OR_DEPARTMENT_OTHER): Payer: Self-pay | Attending: Nurse Practitioner | Admitting: Internal Medicine

## 2020-04-03 VITALS — Ht 75.0 in | Wt 350.0 lb

## 2020-04-03 DIAGNOSIS — Z79899 Other long term (current) drug therapy: Secondary | ICD-10-CM | POA: Insufficient documentation

## 2020-04-03 DIAGNOSIS — I493 Ventricular premature depolarization: Secondary | ICD-10-CM | POA: Insufficient documentation

## 2020-04-03 DIAGNOSIS — R351 Nocturia: Secondary | ICD-10-CM | POA: Insufficient documentation

## 2020-04-03 DIAGNOSIS — G4733 Obstructive sleep apnea (adult) (pediatric): Secondary | ICD-10-CM | POA: Insufficient documentation

## 2020-04-08 ENCOUNTER — Ambulatory Visit (INDEPENDENT_AMBULATORY_CARE_PROVIDER_SITE_OTHER): Payer: Self-pay | Admitting: Nurse Practitioner

## 2020-04-08 ENCOUNTER — Other Ambulatory Visit: Payer: Self-pay

## 2020-04-08 ENCOUNTER — Encounter: Payer: Self-pay | Admitting: Nurse Practitioner

## 2020-04-08 VITALS — BP 145/95 | HR 91 | Temp 97.7°F | Resp 18 | Ht 75.0 in | Wt 346.2 lb

## 2020-04-08 DIAGNOSIS — G43011 Migraine without aura, intractable, with status migrainosus: Secondary | ICD-10-CM

## 2020-04-08 DIAGNOSIS — F32A Depression, unspecified: Secondary | ICD-10-CM

## 2020-04-08 DIAGNOSIS — L989 Disorder of the skin and subcutaneous tissue, unspecified: Secondary | ICD-10-CM

## 2020-04-08 DIAGNOSIS — J4520 Mild intermittent asthma, uncomplicated: Secondary | ICD-10-CM

## 2020-04-08 DIAGNOSIS — G629 Polyneuropathy, unspecified: Secondary | ICD-10-CM

## 2020-04-08 DIAGNOSIS — I10 Essential (primary) hypertension: Secondary | ICD-10-CM

## 2020-04-08 DIAGNOSIS — F419 Anxiety disorder, unspecified: Secondary | ICD-10-CM

## 2020-04-08 DIAGNOSIS — F329 Major depressive disorder, single episode, unspecified: Secondary | ICD-10-CM

## 2020-04-08 DIAGNOSIS — E7800 Pure hypercholesterolemia, unspecified: Secondary | ICD-10-CM

## 2020-04-08 DIAGNOSIS — F5101 Primary insomnia: Secondary | ICD-10-CM

## 2020-04-08 DIAGNOSIS — I252 Old myocardial infarction: Secondary | ICD-10-CM

## 2020-04-08 MED ORDER — METFORMIN HCL 500 MG PO TABS: 1000.0000 mg | ORAL_TABLET | Freq: Two times a day (BID) | ORAL | 3 refills | Status: DC

## 2020-04-08 MED ORDER — SUMATRIPTAN SUCCINATE 100 MG PO TABS: 100.0000 mg | ORAL_TABLET | ORAL | 2 refills | Status: DC | PRN

## 2020-04-08 MED ORDER — HYDROCORTISONE (PERIANAL) 2.5 % EX CREA: 1.0000 "application " | TOPICAL_CREAM | Freq: Four times a day (QID) | CUTANEOUS | 11 refills | Status: DC

## 2020-04-08 MED ORDER — METOPROLOL TARTRATE 25 MG PO TABS: 25.0000 mg | ORAL_TABLET | Freq: Two times a day (BID) | ORAL | 11 refills | Status: DC

## 2020-04-08 MED ORDER — LOSARTAN POTASSIUM-HCTZ 100-25 MG PO TABS: 1.0000 | ORAL_TABLET | Freq: Every day | ORAL | 3 refills | Status: DC

## 2020-04-08 MED ORDER — AMLODIPINE BESYLATE 10 MG PO TABS: 10.0000 mg | ORAL_TABLET | Freq: Every day | ORAL | 3 refills | Status: DC

## 2020-04-08 MED ORDER — METHOCARBAMOL 500 MG PO TABS: 500.0000 mg | ORAL_TABLET | Freq: Three times a day (TID) | ORAL | 11 refills | Status: AC | PRN

## 2020-04-08 MED ORDER — CLOPIDOGREL BISULFATE 75 MG PO TABS: 75.0000 mg | ORAL_TABLET | Freq: Every day | ORAL | 3 refills | Status: AC

## 2020-04-08 MED ORDER — TRAZODONE HCL 150 MG PO TABS: 150.0000 mg | ORAL_TABLET | Freq: Every day | ORAL | 11 refills | Status: AC

## 2020-04-08 MED ORDER — ALBUTEROL SULFATE HFA 108 (90 BASE) MCG/ACT IN AERS: 1.0000 | INHALATION_SPRAY | Freq: Four times a day (QID) | RESPIRATORY_TRACT | 1 refills | Status: DC | PRN

## 2020-04-08 MED ORDER — GABAPENTIN 400 MG PO CAPS: 800.0000 mg | ORAL_CAPSULE | Freq: Three times a day (TID) | ORAL | 11 refills | Status: DC

## 2020-04-08 MED ORDER — PAROXETINE HCL 20 MG PO TABS: 20.0000 mg | ORAL_TABLET | Freq: Every day | ORAL | 11 refills | Status: DC

## 2020-04-08 MED ORDER — BUPROPION HCL ER (SR) 200 MG PO TB12: 200.0000 mg | ORAL_TABLET | Freq: Two times a day (BID) | ORAL | 11 refills | Status: DC

## 2020-04-08 MED ORDER — ATORVASTATIN CALCIUM 40 MG PO TABS: 40.0000 mg | ORAL_TABLET | Freq: Every day | ORAL | 3 refills | Status: DC

## 2020-04-08 NOTE — Progress Notes (Signed)
[facility] [place], [place]  [address] Phone:  [phone]   Fax:  [phone]   Established Patient Office Visit  Subjective:  Patient ID: Jeffrey Horrigan., male    DOB: 07/10/1981  Age: 39 y.o. MRN: [id number]  CC:  Chief Complaint  Patient presents with  . Follow-up    Pt states he is here for a f/u on the sleep study, and his  hemrriods have blood clots is coming out anytime even when he cough.     HPI Jeffrey Barrera. presents for follow-up.  He  has a past medical history of Anxiety, Arthritis, Asthma, Blind left eye, Claudication ([place]) (04/02/2017), Coronary artery disease involving native coronary artery of native heart without angina pectoris (04/02/2017), Depression, Essential hypertension (03/07/2017), Glucose intolerance (impaired glucose tolerance) (03/15/2017), Hypertension, Inflamed external hemorrhoid, Inflamed internal hemorrhoid, Internal and external bleeding hemorrhoids (2019), Myocardial infarction ([place]), Paresthesia of lower extremity (04/02/2017), Pure hypercholesterolemia (03/07/2017), Snoring (03/13/2018), and Tobacco abuse (03/07/2017).   Sleep Apnea He presents for a sleep evaluation.  He complains of snoring and a "choking feeling".  Symptoms began several years  ago, gradually worsening since that time. He has had to have deep stimulation to be awaken int he past. .He denies decreased memory, knees buckling with laughing, completely or partially paralyzed while falling asleep or waking up, noisy environment, uncomfortable room temperature. Previous evaluation and treatment has included medications for insomina weight loss and sleep study which the results are not in the system.Marland Kitchen  Hemorrhoids Patient presents for follow up of hemorrhoids. Onset of symptoms was sudden. Symptoms have been stable since that time. Symptoms include: pain with sitting and rectal blood clots . Patient denies family hx of colorectal CA, history of previous  STDs, known or suspected STD exposure and receptive anal intercourse. Treatment to date has been Apple cider vinegar baths Preparation H and Tylenol . He has insurance limitations at this time but is schedule for Financial assistance program.  He is requesting completion of accomodation for his current apartment due to his visual impairment and asthma.  Hypertension Patient is here for follow-up of elevated blood pressure. He is not exercising and is adherent to a low-salt diet. Blood pressure continues to vary at home.. Cardiac symptoms: lower extremity edema and paroxysmal nocturnal dyspnea. Patient denies chest pain, claudication, irregular heart beat, palpitations and syncope. Cardiovascular risk factors: diabetes mellitus, dyslipidemia, hypertension, male gender, obesity (BMI >= 30 kg/m2), sedentary lifestyle and smoking/ tobacco exposure. Use of agents associated with hypertension: steroids. History of target organ damage: angina/ prior myocardial infarction and PVD.   Past Medical History:  Diagnosis Date  . Anxiety   . Arthritis   . Asthma   . Blind left eye   . Claudication ([place]) 04/02/2017  . Coronary artery disease involving native coronary artery of native heart without angina pectoris 04/02/2017  . Depression   . Essential hypertension 03/07/2017  . Glucose intolerance (impaired glucose tolerance) 03/15/2017  . Hypertension   . Inflamed external hemorrhoid   . Inflamed internal hemorrhoid   . Internal and external bleeding hemorrhoids 2019  . Myocardial infarction ([place])   . Paresthesia of lower extremity 04/02/2017  . Pure hypercholesterolemia 03/07/2017  . Snoring 03/13/2018  . Tobacco abuse 03/07/2017    Past Surgical History:  Procedure Laterality Date  . CARDIAC CATHETERIZATION    . cardiac stents    . CORONARY ANGIOPLASTY    . LEFT HEART CATH AND CORONARY ANGIOGRAPHY N/A  07/11/2018   Procedure: LEFT HEART CATH AND CORONARY ANGIOGRAPHY;  Surgeon: Nelva Bush, MD;   Location: [facility];  Service: Cardiovascular;  Laterality: N/A;    Family History  Problem Relation Age of Onset  . Hypertension Mother   . Heart attack Mother   . Heart disease Mother   . Hypertension Father     Social History   Socioeconomic History  . Marital status: Married    Spouse name: Not on file  . Number of children: Not on file  . Years of education: Not on file  . Highest education level: Not on file  Occupational History  . Not on file  Tobacco Use  . Smoking status: Current Every Day Smoker    Types: Cigarettes  . Smokeless tobacco: Former Systems developer    Types: Chew    Quit date: 2000  . Tobacco comment: 4 cigarettes   Vaping Use  . Vaping Use: Never used  Substance and Sexual Activity  . Alcohol use: Not Currently  . Drug use: Yes    Types: Marijuana  . Sexual activity: Yes  Other Topics Concern  . Not on file  Social History Narrative  . Not on file   Social Determinants of Health   Financial Resource Strain:   . Difficulty of Paying Living Expenses:   Food Insecurity:   . Worried About Charity fundraiser in the Last Year:   . Arboriculturist in the Last Year:   Transportation Needs:   . Film/video editor (Medical):   Marland Kitchen Lack of Transportation (Non-Medical):   Physical Activity:   . Days of Exercise per Week:   . Minutes of Exercise per Session:   Stress:   . Feeling of Stress :   Social Connections:   . Frequency of Communication with Friends and Family:   . Frequency of Social Gatherings with Friends and Family:   . Attends Religious Services:   . Active Member of Clubs or Organizations:   . Attends Archivist Meetings:   Marland Kitchen Marital Status:   Intimate Partner Violence:   . Fear of Current or Ex-Partner:   . Emotionally Abused:   Marland Kitchen Physically Abused:   . Sexually Abused:     Outpatient Medications Prior to Visit  Medication Sig Dispense Refill  . blood glucose meter kit and supplies Dispense based on patient and  insurance preference. Use up to four times daily as directed. (FOR ICD-10 E10.9, E11.9). 1 each 0  . Blood Glucose Monitoring Suppl (TRUE METRIX METER) w/Device KIT Use as instructed 1 kit 0  . chlorhexidine (PERIDEX) 0.12 % solution Use as directed 15 mLs in the mouth or throat 2 (two) times daily. 473 mL 1  . Insulin Pen Needle (B-D UF III MINI PEN NEEDLES) 31G X 5 MM MISC Use as instructed. Inject into the skin once nightly. 90 each 1  . PRAZOSIN HCL PO Take 1 mg by mouth at bedtime.    . TRUEplus Lancets 28G MISC Use as instructed. Check blood glucose level by fingerstick twice per day.  E11.65 100 each 3  . albuterol (VENTOLIN HFA) 108 (90 Base) MCG/ACT inhaler Inhale 1-2 puffs into the lungs every 6 (six) hours as needed for wheezing or shortness of breath. 18 g 1  . amLODipine (NORVASC) 10 MG tablet Take 1 tablet (10 mg total) by mouth daily. 90 tablet 1  . atorvastatin (LIPITOR) 40 MG tablet Take 1 tablet (40 mg total) by mouth  at bedtime. 90 tablet 1  . clopidogrel (PLAVIX) 75 MG tablet Take 1 tablet (75 mg total) by mouth daily. 90 tablet 0  . gabapentin (NEURONTIN) 400 MG capsule Take 2 capsules (800 mg total) by mouth 3 (three) times daily. 90 capsule 3  . losartan-hydrochlorothiazide (HYZAAR) 100-25 MG tablet Take 1 tablet by mouth daily. 90 tablet 3  . metoprolol tartrate (LOPRESSOR) 25 MG tablet Take 1 tablet (25 mg total) by mouth 2 (two) times daily. 60 tablet 2  . PARoxetine (PAXIL) 20 MG tablet Take 1 tablet (20 mg total) by mouth daily. 30 tablet 2  . furosemide (LASIX) 40 MG tablet Take 1 tablet (40 mg total) by mouth daily as needed for edema. 15 tablet 0  . glucose blood (TRUE METRIX BLOOD GLUCOSE TEST) test strip Use as instructed. Check blood glucose level by fingerstick twice per day.  E11.65 100 each 12  . Insulin Glargine (LANTUS) 100 UNIT/ML Solostar Pen Inject 30 Units into the skin daily at 10 pm. 15 mL 11  . nitroGLYCERIN (NITROSTAT) 0.4 MG SL tablet Place 1 tablet  (0.4 mg total) under the tongue every 5 (five) minutes as needed for chest pain. (Patient not taking: Reported on 11/18/2018) 25 tablet 11  . buPROPion (WELLBUTRIN SR) 200 MG 12 hr tablet Take 1 tablet (200 mg total) by mouth 2 (two) times daily. 60 tablet 3  . metFORMIN (GLUCOPHAGE) 500 MG tablet Take 2 tablets (1,000 mg total) by mouth 2 (two) times daily with a meal. 360 tablet 0  . ramelteon (ROZEREM) 8 MG tablet Take 8 mg by mouth at bedtime. (Patient not taking: Reported on 04/08/2020)    . SUMAtriptan (IMITREX) 100 MG tablet Take 1 tablet (100 mg total) by mouth every 2 (two) hours as needed for migraine (Max 2 tablets daily). Repeat in 2 hrs if headache persists (Patient not taking: Reported on 09/29/2019) 9 tablet 2  . tiZANidine (ZANAFLEX) 4 MG tablet Take 1 tablet (4 mg total) by mouth every 6 (six) hours as needed for muscle spasms. (Patient not taking: Reported on 04/08/2020) 30 tablet 1  . traZODone (DESYREL) 150 MG tablet Take 1 tablet (150 mg total) by mouth at bedtime. 90 tablet 2   No facility-administered medications prior to visit.    Allergies  Allergen Reactions  . Asa [Aspirin] Anaphylaxis  . Lemon Flavor Anaphylaxis    Throat swell up.   . Other Anaphylaxis and Swelling    Pickle  . Penicillins Anaphylaxis and Rash    Has patient had a PCN reaction causing immediate rash, facial/tongue/throat swelling, SOB or lightheadedness with hypotension: Yes Has patient had a PCN reaction causing severe rash involving mucus membranes or skin necrosis: No Has patient had a PCN reaction that required hospitalization: reaction occurred in the hospital Has patient had a PCN reaction occurring within the last 10 years: Yes If all of the above answers are "NO", then may proceed with Cephalosporin use.  . Iodine   . Lisinopril     Kidney failure  . Clindamycin Rash  . Erythromycin Rash    ROS Review of Systems  All other systems reviewed and are negative.     Objective:      Physical Exam Constitutional:      General: He is not in acute distress.    Appearance: He is obese. He is not ill-appearing.  HENT:     Nose: Nose normal.     Mouth/Throat:     Mouth: Mucous membranes are  moist.  Cardiovascular:     Rate and Rhythm: Normal rate and regular rhythm.     Pulses: Normal pulses.     Heart sounds: Normal heart sounds.  Pulmonary:     Effort: Pulmonary effort is normal.     Breath sounds: Normal breath sounds.  Musculoskeletal:     Cervical back: Normal range of motion.  Feet:     Right foot:     Protective Sensation: 0 sites tested. 0 sites sensed.     Left foot:     Protective Sensation: 0 sites tested. 0 sites sensed.  Skin:    General: Skin is warm and dry.     Capillary Refill: Capillary refill takes less than 2 seconds.  Neurological:     Mental Status: He is alert and oriented to person, place, and time.  Psychiatric:        Mood and Affect: Mood normal.        Behavior: Behavior normal.        Thought Content: Thought content normal.        Judgment: Judgment normal.     BP (!) 145/95 (BP Location: Right Arm, Patient Position: Sitting, Cuff Size: Large)   Pulse 91   Temp 97.7 F (36.5 C)   Resp 18   Ht '6\' 3"'  (1.905 m)   Wt (!) 346 lb 3.2 oz (157 kg)   SpO2 97%   BMI 43.27 kg/m  Wt Readings from Last 3 Encounters:  04/08/20 (!) 346 lb 3.2 oz (157 kg)  04/03/20 (!) 350 lb (158.8 kg)  03/16/20 (!) 347 lb (157.4 kg)     There are no preventive care reminders to display for this patient.  There are no preventive care reminders to display for this patient.  No results found for: TSH Lab Results  Component Value Date   WBC 11.1 (H) 09/29/2019   HGB 14.8 09/29/2019   HCT 45.0 09/29/2019   MCV 86.5 09/29/2019   PLT 293 09/29/2019   Lab Results  Component Value Date   NA 137 09/29/2019   K 3.7 09/29/2019   CO2 27 09/29/2019   GLUCOSE 260 (H) 09/29/2019   BUN 12 09/29/2019   CREATININE 0.67 09/29/2019   BILITOT 0.7  09/29/2019   ALKPHOS 65 09/29/2019   AST 19 09/29/2019   ALT 24 09/29/2019   PROT 6.5 09/29/2019   ALBUMIN 3.6 09/29/2019   CALCIUM 8.7 (L) 09/29/2019   ANIONGAP 9 09/29/2019   Lab Results  Component Value Date   CHOL 133 09/02/2019   Lab Results  Component Value Date   HDL 26 (L) 09/02/2019   Lab Results  Component Value Date   LDLCALC 72 09/02/2019   Lab Results  Component Value Date   TRIG 212 (H) 09/02/2019   Lab Results  Component Value Date   CHOLHDL 5.1 (H) 09/02/2019   Lab Results  Component Value Date   HGBA1C 8.6 (A) 02/26/2020   HGBA1C 8.6 02/26/2020   HGBA1C 8.6 (A) 02/26/2020   HGBA1C 8.6 (A) 02/26/2020      Assessment & Plan:   Problem List Items Addressed This Visit      Cardiovascular and Mediastinum   Essential hypertension   Relevant Medications   amLODipine (NORVASC) 10 MG tablet   atorvastatin (LIPITOR) 40 MG tablet   losartan-hydrochlorothiazide (HYZAAR) 100-25 MG tablet   metoprolol tartrate (LOPRESSOR) 25 MG tablet Continue with current regimen Home monitoring encouraged: bring in BP readings and monitor  Respiratory   Asthma   Relevant Medications   albuterol (VENTOLIN HFA) 108 (90 Base) MCG/ACT inhaler Encourage smoking cessation and avoidance of any inhalation agents as well as secondhand smoke     Other   Pure hypercholesterolemia   Relevant Medications   amLODipine (NORVASC) 10 MG tablet   atorvastatin (LIPITOR) 40 MG tablet   losartan-hydrochlorothiazide (HYZAAR) 100-25 MG tablet   metoprolol tartrate (LOPRESSOR) 25 MG tablet  Continue current regimen    Other Visit Diagnoses    Foot lesion    -  Primary   Relevant Orders   WOUND CULTURE (Completed)   History of MI (myocardial infarction)       Relevant Medications   clopidogrel (PLAVIX) 75 MG tablet   metoprolol tartrate (LOPRESSOR) 25 MG tablet Continue current regimen   Peripheral polyneuropathy       Relevant Medications   gabapentin (NEURONTIN) 400 MG  capsule   PARoxetine (PAXIL) 20 MG tablet   buPROPion (WELLBUTRIN SR) 200 MG 12 hr tablet   traZODone (DESYREL) 150 MG tablet   methocarbamol (ROBAXIN) 500 MG tablet   Major depression, chronic       Relevant Medications   PARoxetine (PAXIL) 20 MG tablet   buPROPion (WELLBUTRIN SR) 200 MG 12 hr tablet   traZODone (DESYREL) 150 MG tablet Encourage follow up with BH due to the system change at [facility]    Anxiety and depression       Relevant Medications   PARoxetine (PAXIL) 20 MG tablet   buPROPion (WELLBUTRIN SR) 200 MG 12 hr tablet   traZODone (DESYREL) 150 MG tablet   Intractable migraine without aura and with status migrainosus       Relevant Medications   amLODipine (NORVASC) 10 MG tablet   atorvastatin (LIPITOR) 40 MG tablet   gabapentin (NEURONTIN) 400 MG capsule   losartan-hydrochlorothiazide (HYZAAR) 100-25 MG tablet   metoprolol tartrate (LOPRESSOR) 25 MG tablet   PARoxetine (PAXIL) 20 MG tablet   buPROPion (WELLBUTRIN SR) 200 MG 12 hr tablet   SUMAtriptan (IMITREX) 100 MG tablet   traZODone (DESYREL) 150 MG tablet   methocarbamol (ROBAXIN) 500 MG tablet  Continue with current regimen  Encourage lifestyle modification with dietary changes and exercise to promote more weight reduction   Primary insomnia       Relevant Medications   traZODone (DESYREL) 150 MG tablet Awaiting sleep study results for treatment plane      Meds ordered this encounter  Medications  . albuterol (VENTOLIN HFA) 108 (90 Base) MCG/ACT inhaler    Sig: Inhale 1-2 puffs into the lungs every 6 (six) hours as needed for wheezing or shortness of breath.    Dispense:  18 g    Refill:  1    Order Specific Question:   Supervising Provider    Answer:   Tresa Garter [id number]  . amLODipine (NORVASC) 10 MG tablet    Sig: Take 1 tablet (10 mg total) by mouth daily.    Dispense:  90 tablet    Refill:  3    Order Specific Question:   Supervising Provider    Answer:   Tresa Garter  [id number]  . atorvastatin (LIPITOR) 40 MG tablet    Sig: Take 1 tablet (40 mg total) by mouth at bedtime.    Dispense:  90 tablet    Refill:  3    Order Specific Question:   Supervising Provider    Answer:   Tresa Garter [id number]  . clopidogrel (PLAVIX)  75 MG tablet    Sig: Take 1 tablet (75 mg total) by mouth daily.    Dispense:  90 tablet    Refill:  3    Order Specific Question:   Supervising Provider    Answer:   Tresa Garter [id number]  . gabapentin (NEURONTIN) 400 MG capsule    Sig: Take 2 capsules (800 mg total) by mouth 3 (three) times daily.    Dispense:  180 capsule    Refill:  11    Order Specific Question:   Supervising Provider    Answer:   Tresa Garter [id number]  . losartan-hydrochlorothiazide (HYZAAR) 100-25 MG tablet    Sig: Take 1 tablet by mouth daily.    Dispense:  90 tablet    Refill:  3    Order Specific Question:   Supervising Provider    Answer:   Tresa Garter [id number]  . metoprolol tartrate (LOPRESSOR) 25 MG tablet    Sig: Take 1 tablet (25 mg total) by mouth 2 (two) times daily.    Dispense:  60 tablet    Refill:  11    Order Specific Question:   Supervising Provider    Answer:   Tresa Garter [id number]  . PARoxetine (PAXIL) 20 MG tablet    Sig: Take 1 tablet (20 mg total) by mouth daily.    Dispense:  30 tablet    Refill:  11    Order Specific Question:   Supervising Provider    Answer:   Tresa Garter [id number]  . buPROPion (WELLBUTRIN SR) 200 MG 12 hr tablet    Sig: Take 1 tablet (200 mg total) by mouth 2 (two) times daily.    Dispense:  60 tablet    Refill:  11    Order Specific Question:   Supervising Provider    Answer:   Tresa Garter [id number]  . metFORMIN (GLUCOPHAGE) 500 MG tablet    Sig: Take 2 tablets (1,000 mg total) by mouth 2 (two) times daily with a meal.    Dispense:  360 tablet    Refill:  3    Order Specific Question:   Supervising Provider    Answer:   Tresa Garter [id number]  . SUMAtriptan (IMITREX) 100 MG tablet    Sig: Take 1 tablet (100 mg total) by mouth every 2 (two) hours as needed for migraine (Max 2 tablets daily). Repeat in 2 hrs if headache persists    Dispense:  9 tablet    Refill:  2    Order Specific Question:   Supervising Provider    Answer:   Tresa Garter [id number]  . traZODone (DESYREL) 150 MG tablet    Sig: Take 1 tablet (150 mg total) by mouth at bedtime.    Dispense:  30 tablet    Refill:  11    Order Specific Question:   Supervising Provider    Answer:   Tresa Garter [id number]  . methocarbamol (ROBAXIN) 500 MG tablet    Sig: Take 1 tablet (500 mg total) by mouth every 8 (eight) hours as needed for muscle spasms.    Dispense:  90 tablet    Refill:  11    Order Specific Question:   Supervising Provider    Answer:   Tresa Garter [id number]  . hydrocortisone (PROCTOSOL HC) 2.5 % rectal cream    Sig: Place 1 application rectally 4 (four) times daily.    Dispense:  28 g  Refill:  11    Order Specific Question:   Supervising Provider    Answer:   Tresa Garter [[id number]]    Follow-up: Return in about 3 months (around 07/09/2020).    Vevelyn Francois, NP

## 2020-04-11 DIAGNOSIS — G4733 Obstructive sleep apnea (adult) (pediatric): Secondary | ICD-10-CM

## 2020-04-11 NOTE — Procedures (Signed)
    Patient Name: Jeffrey Barrera, Jeffrey Barrera Date: 04/03/2020 Gender: Male D.O.B: [birth year] Age (years): 38 Referring Provider: Thad Ranger NP Height (inches): 74 Interpreting Physician: Jetty Duhamel MD, ABSM Weight (lbs): 364 RPSGT: Lise Auer BMI: 47 MRN: [id number] Neck Size: 18.00  CLINICAL INFORMATION Sleep Study Type: NPSG Indication for sleep study: Obesity, OSA, Snoring Epworth Sleepiness Score: 13  SLEEP STUDY TECHNIQUE As per the AASM Manual for the Scoring of Sleep and Associated Events v2.3 (April 2016) with a hypopnea requiring 4% desaturations.  The channels recorded and monitored were frontal, central and occipital EEG, electrooculogram (EOG), submentalis EMG (chin), nasal and oral airflow, thoracic and abdominal wall motion, anterior tibialis EMG, snore microphone, electrocardiogram, and pulse oximetry.  MEDICATIONS Medications self-administered by patient taken the night of the study : PRAZOSIN, GABAPENTIN, TRAZODONE  SLEEP ARCHITECTURE The study was initiated at 11:15:38 PM and ended at 5:24:37 AM.  Sleep onset time was 4.8 minutes and the sleep efficiency was 84.1%%. The total sleep time was 310.2 minutes.  Stage REM latency was 93.0 minutes.  The patient spent 14.7%% of the night in stage N1 sleep, 79.5%% in stage N2 sleep, 0.0%% in stage N3 and 5.8% in REM.  Alpha intrusion was absent.  Supine sleep was 7.06%.  RESPIRATORY PARAMETERS The overall apnea/hypopnea index (AHI) was 11.0 per hour. There were 7 total apneas, including 7 obstructive, 0 central and 0 mixed apneas. There were 50 hypopneas and 4 RERAs.  The AHI during Stage REM sleep was 6.7 per hour.  AHI while supine was 46.6 per hour.  The mean oxygen saturation was 92.2%. The minimum SpO2 during sleep was 82.0%.  loud snoring was noted during this study.  CARDIAC DATA The 2 lead EKG demonstrated sinus rhythm. The mean heart rate was 63.2 beats per minute. Other EKG findings  include: PVCs.  LEG MOVEMENT DATA The total PLMS were 0 with a resulting PLMS index of 0.0. Associated arousal with leg movement index was 0.0 .  IMPRESSIONS - Mild obstructive sleep apnea occurred during this study (AHI = 11.0/h). - Insufficient early events to meet protocol requirement for split night CPAP titration. - No significant central sleep apnea occurred during this study (CAI = 0.0/h). - Mild oxygen desaturation was noted during this study (Min O2 = 82.0%). Mean sat 92.4%. - The patient snored with loud snoring volume. - EKG findings include occasional PVCs. - Clinically significant periodic limb movements did not occur during sleep. No significant associated arousals. - Nocturia x 3.  DIAGNOSIS - Obstructive Sleep Apnea (G47.33)  RECOMMENDATIONS - Consider CPAP titration sleep study, autopap, a fitted oral appliance or ENT evaluation. Sleep Mediacl consultation is available. - Be carefull with alcohol, sedatives and other CNS depressants that may worsen sleep apnea and disrupt normal sleep architecture. - Sleep hygiene should be reviewed to assess factors that may improve sleep quality. - Weight management and regular exercise should be initiated or continued if appropriate.  [Electronically signed] 04/11/2020 10:08 AM  Jetty Duhamel MD, ABSM Diplomate, American Board of Sleep Medicine   NPI: [id number]                         Jetty Duhamel Diplomate, American Board of Sleep Medicine  ELECTRONICALLY SIGNED ON:  04/11/2020, 10:01 AM [facility] PH: [phone]   FX: [phone] ACCREDITED BY THE AMERICAN ACADEMY OF SLEEP MEDICINE

## 2020-04-12 ENCOUNTER — Other Ambulatory Visit: Payer: Self-pay | Admitting: Nurse Practitioner

## 2020-04-12 ENCOUNTER — Telehealth: Payer: Self-pay

## 2020-04-12 DIAGNOSIS — G4733 Obstructive sleep apnea (adult) (pediatric): Secondary | ICD-10-CM

## 2020-04-12 LAB — WOUND CULTURE

## 2020-04-12 MED ORDER — SULFAMETHOXAZOLE-TRIMETHOPRIM 800-160 MG PO TABS
1.0000 | ORAL_TABLET | Freq: Two times a day (BID) | ORAL | 0 refills | Status: AC
Start: 2020-04-12 — End: 2020-04-19

## 2020-04-12 NOTE — Telephone Encounter (Signed)
Patient called back and I have made aware of results and does want to do Cpap titration. Can you please order this study? Thanks!

## 2020-04-12 NOTE — Telephone Encounter (Signed)
Called, no answer. Left a message to call back. Thanks!  

## 2020-04-12 NOTE — Telephone Encounter (Signed)
-----   Message from Barbette Merino, NP sent at 04/12/2020  1:36 PM EDT ----- Can you call the patient and make him aware that he was diagnosed with mild obstructive sleep apnea.  A CPAP titration sleep study and or referral to sleep medicine for further evaluation is recommended. Please let me know what the patient would like to do is related to his sleep study.His wound culture did result Staphylococcus aureus.  We can treat this with Bactrim I have sent the Bactrim over to [facility].  Thank

## 2020-04-12 NOTE — Progress Notes (Signed)
° °  [facility] Patient [facility] [address] [place], [place]  [address] Phone:  [phone]   Fax:  [phone]  Objective:  Recent sleep study indicates mild OSA.   Plan:                      Consider CPAP titration sleep study, autopap, a fitted oral appliance or ENT evaluation.

## 2020-04-15 ENCOUNTER — Telehealth: Payer: Self-pay

## 2020-04-15 ENCOUNTER — Other Ambulatory Visit: Payer: Self-pay | Admitting: Nurse Practitioner

## 2020-04-15 MED ORDER — BENZOCAINE 20 % RE OINT: TOPICAL_OINTMENT | RECTAL | 0 refills | Status: DC | PRN

## 2020-04-15 NOTE — Progress Notes (Signed)
   [facility] Patient [facility] [address] [place], [place]  [address] Phone:  [phone]   Fax:  [phone]  Patient called and requesting oral pain medication for hemorrhoids. Benzocaine ointment prescribed

## 2020-04-15 NOTE — Telephone Encounter (Signed)
Called and spoke with patient, advised that hydrocodone will not be sent in and that benzocaine has been sent in for hemorrhoid pain. Thanks!

## 2020-04-15 NOTE — Telephone Encounter (Signed)
Patient called and asked if you can send hydrocodone 5/325mg .He says that during appointment it was discussed he could have a 1 month rx if needed and he says he really needs this. Please send to [facility] [address] if possible. Thanks!

## 2020-04-15 NOTE — Telephone Encounter (Signed)
Please apologize to [name], That I have sent him in some benzocaine for his hemorrhoid pain.  Ask him if there is something else going on if so I need to set him up with pain management. Thanks

## 2020-04-17 ENCOUNTER — Emergency Department (HOSPITAL_COMMUNITY)
Admission: EM | Admit: 2020-04-17 | Discharge: 2020-04-17 | Disposition: A | Discharge status: Home / Self Care | Payer: Self-pay | Attending: Emergency Medicine | Admitting: Emergency Medicine

## 2020-04-17 ENCOUNTER — Other Ambulatory Visit: Payer: Self-pay

## 2020-04-17 ENCOUNTER — Emergency Department (HOSPITAL_COMMUNITY): Payer: Self-pay

## 2020-04-17 DIAGNOSIS — Z7951 Long term (current) use of inhaled steroids: Secondary | ICD-10-CM | POA: Insufficient documentation

## 2020-04-17 DIAGNOSIS — Y9389 Activity, other specified: Secondary | ICD-10-CM | POA: Insufficient documentation

## 2020-04-17 DIAGNOSIS — Z794 Long term (current) use of insulin: Secondary | ICD-10-CM | POA: Insufficient documentation

## 2020-04-17 DIAGNOSIS — Y9289 Other specified places as the place of occurrence of the external cause: Secondary | ICD-10-CM | POA: Insufficient documentation

## 2020-04-17 DIAGNOSIS — I10 Essential (primary) hypertension: Secondary | ICD-10-CM | POA: Insufficient documentation

## 2020-04-17 DIAGNOSIS — Y998 Other external cause status: Secondary | ICD-10-CM | POA: Insufficient documentation

## 2020-04-17 DIAGNOSIS — F17210 Nicotine dependence, cigarettes, uncomplicated: Secondary | ICD-10-CM | POA: Insufficient documentation

## 2020-04-17 DIAGNOSIS — M26629 Arthralgia of temporomandibular joint, unspecified side: Secondary | ICD-10-CM | POA: Insufficient documentation

## 2020-04-17 DIAGNOSIS — Z79899 Other long term (current) drug therapy: Secondary | ICD-10-CM | POA: Insufficient documentation

## 2020-04-17 DIAGNOSIS — W19XXXA Unspecified fall, initial encounter: Secondary | ICD-10-CM | POA: Insufficient documentation

## 2020-04-17 DIAGNOSIS — I2510 Atherosclerotic heart disease of native coronary artery without angina pectoris: Secondary | ICD-10-CM | POA: Insufficient documentation

## 2020-04-17 DIAGNOSIS — J45909 Unspecified asthma, uncomplicated: Secondary | ICD-10-CM | POA: Insufficient documentation

## 2020-04-17 DIAGNOSIS — S40012A Contusion of left shoulder, initial encounter: Secondary | ICD-10-CM | POA: Insufficient documentation

## 2020-04-17 IMAGING — CR DG HUMERUS 2V *L*
4 series · 4 of 4 positions shown · non-contrast
Comparison: None.

CLINICAL DATA: Fall, pain.

EXAM:
LEFT HUMERUS - 2+ VIEW

[w humerus ap left (1 of 2)]
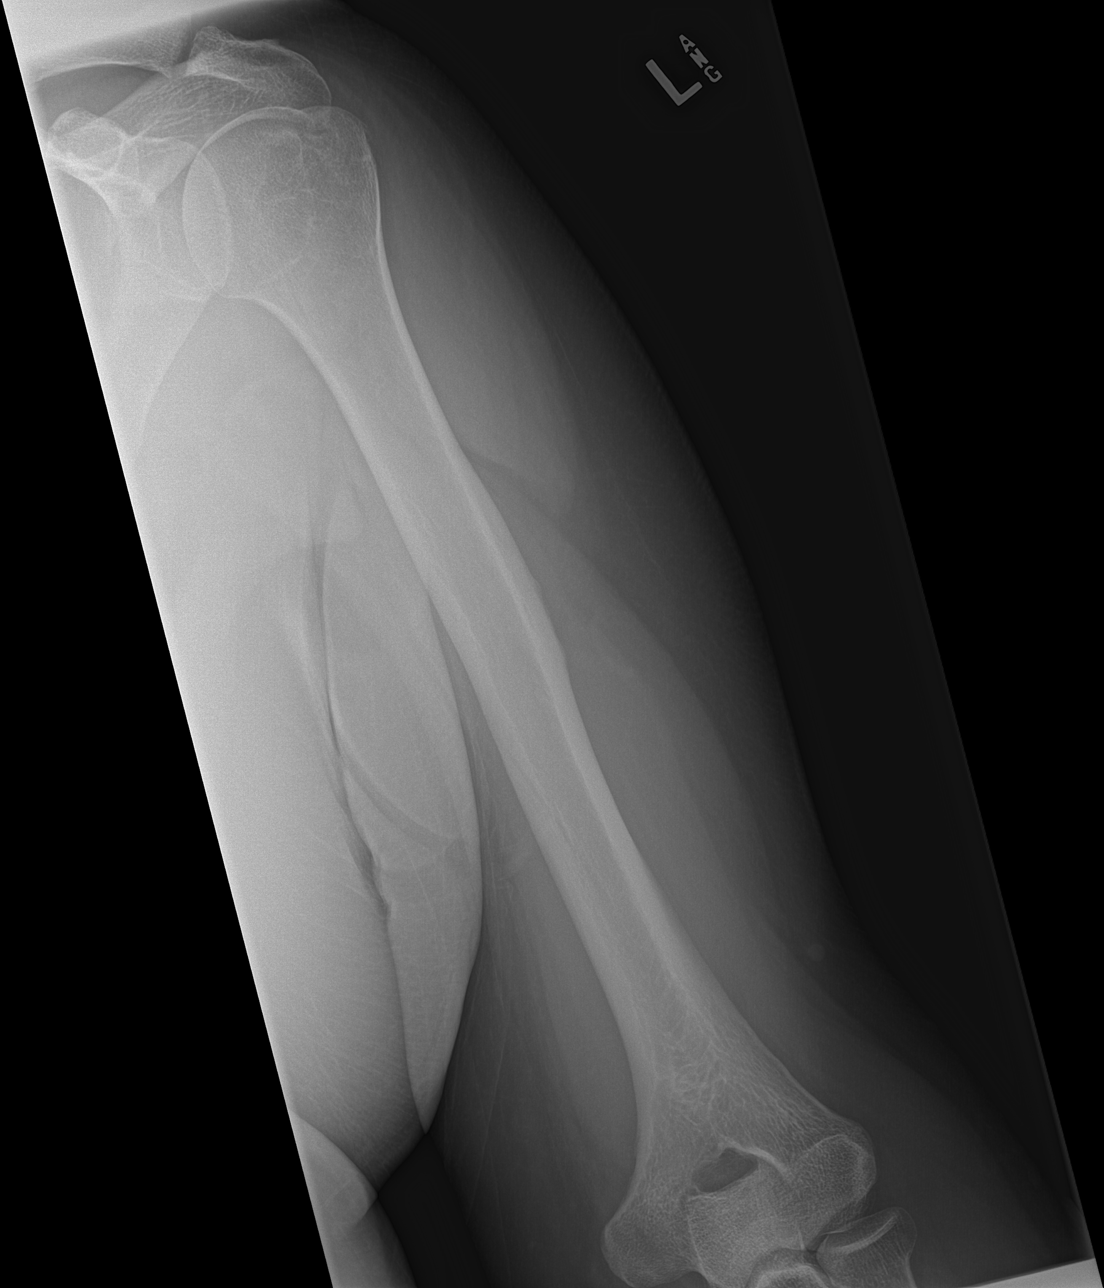

[w humerus ap left (2 of 2)]
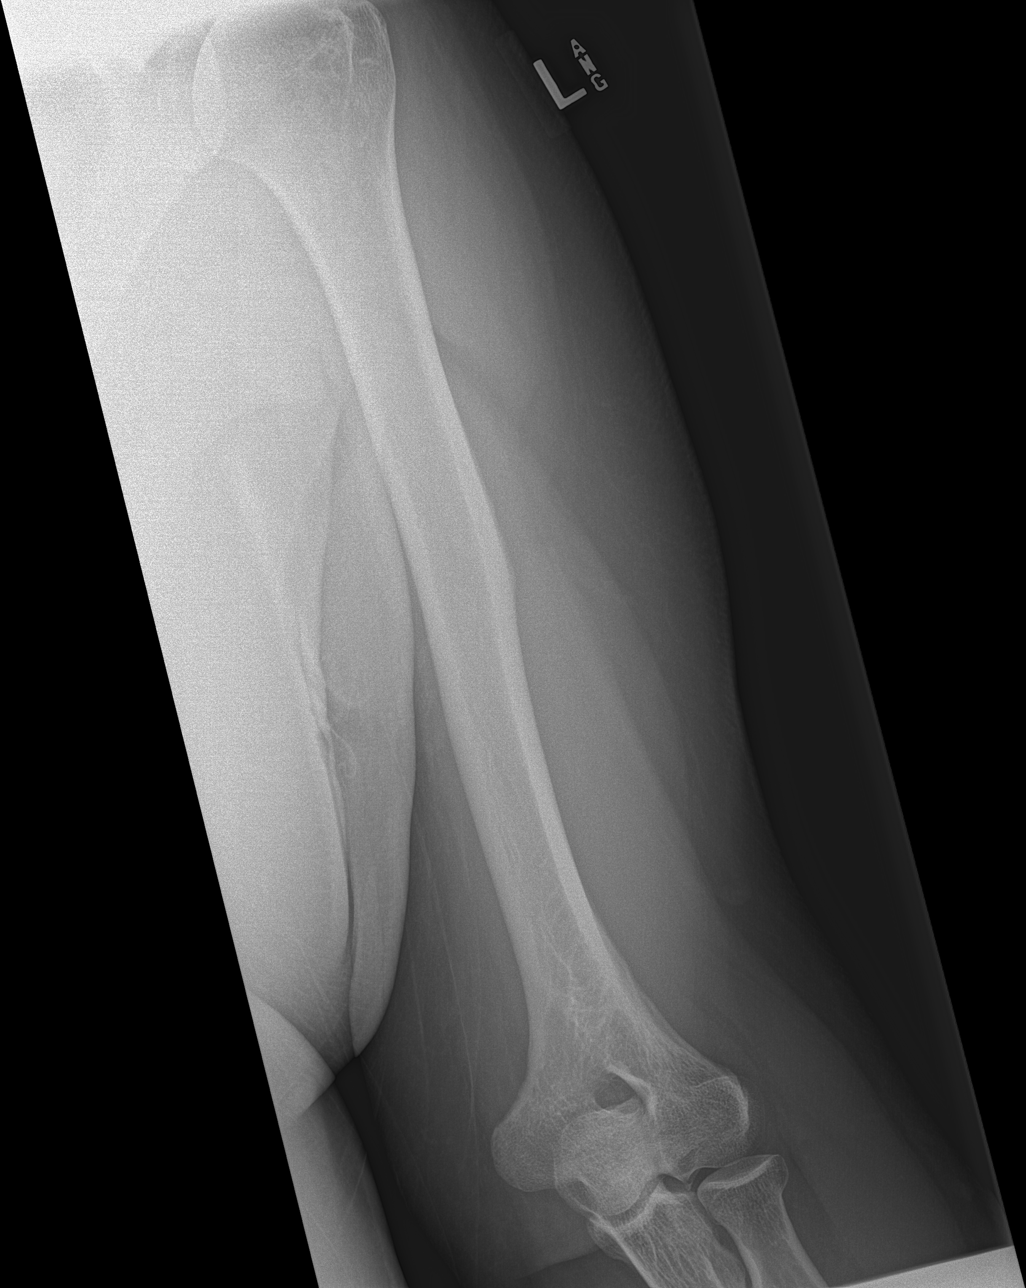

[w humerus lat left]
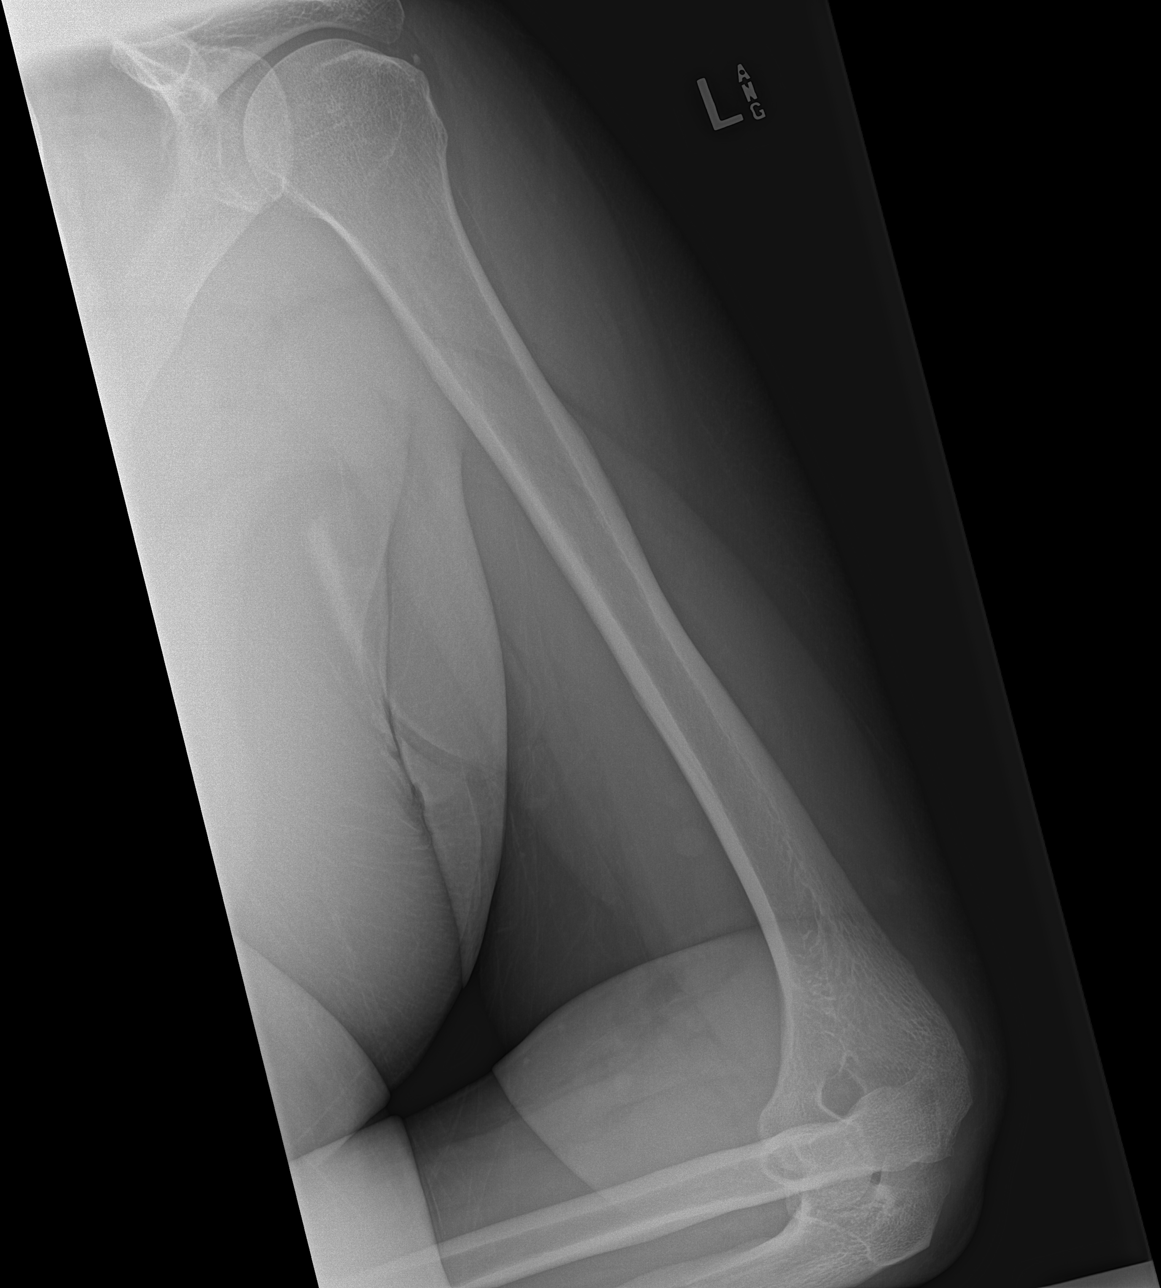

[x humerus lat left]
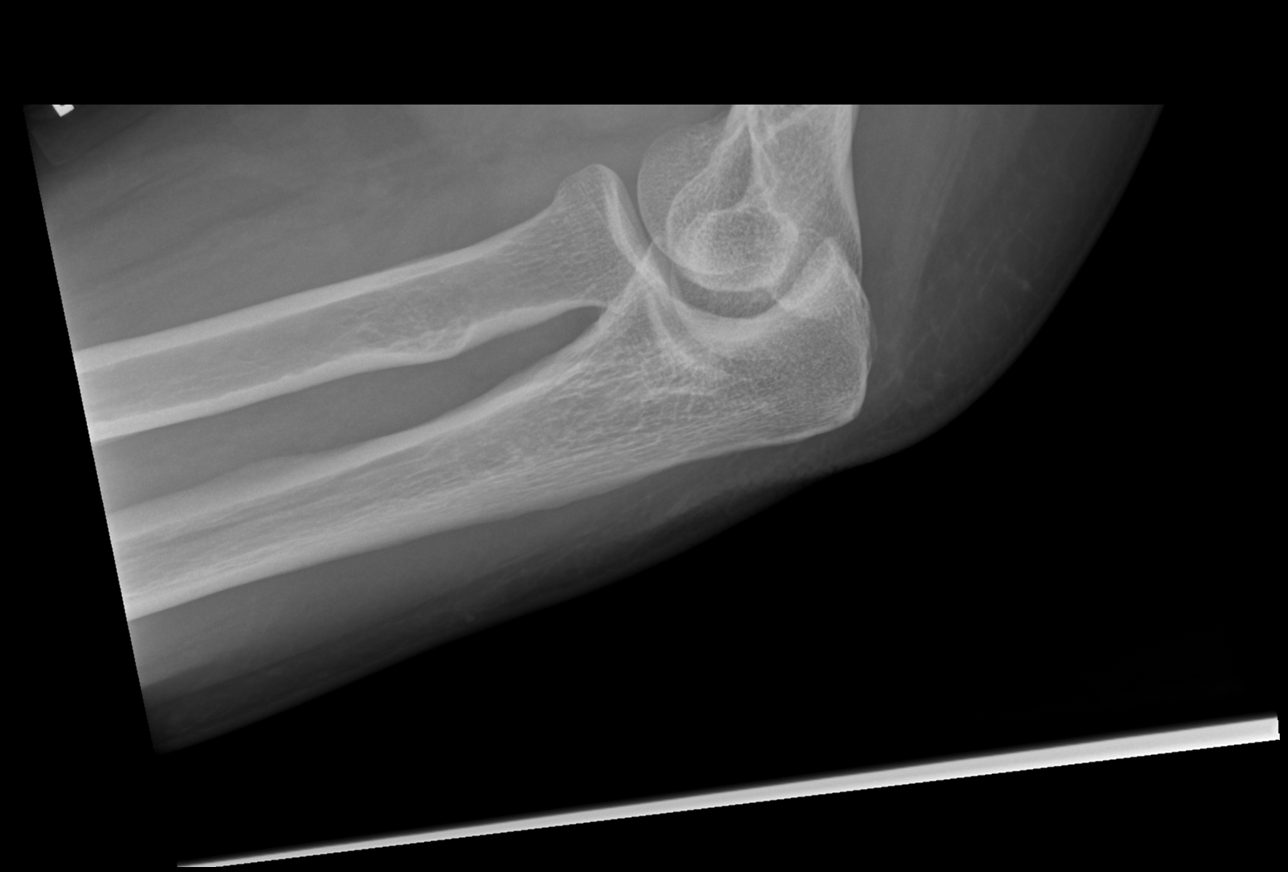

[4 of 4 positions shown; findings below may reference images not displayed]

FINDINGS: There is no evidence of fracture or other focal bone lesions. Soft
tissues are unremarkable.
IMPRESSION: Negative.

## 2020-04-17 IMAGING — CR DG SHOULDER 2+V*L*
2 series · 2 of 2 positions shown · non-contrast
Comparison: None.

CLINICAL DATA: Fall, pain.

EXAM:
LEFT SHOULDER - 2+ VIEW

[w shoulder y-view left]
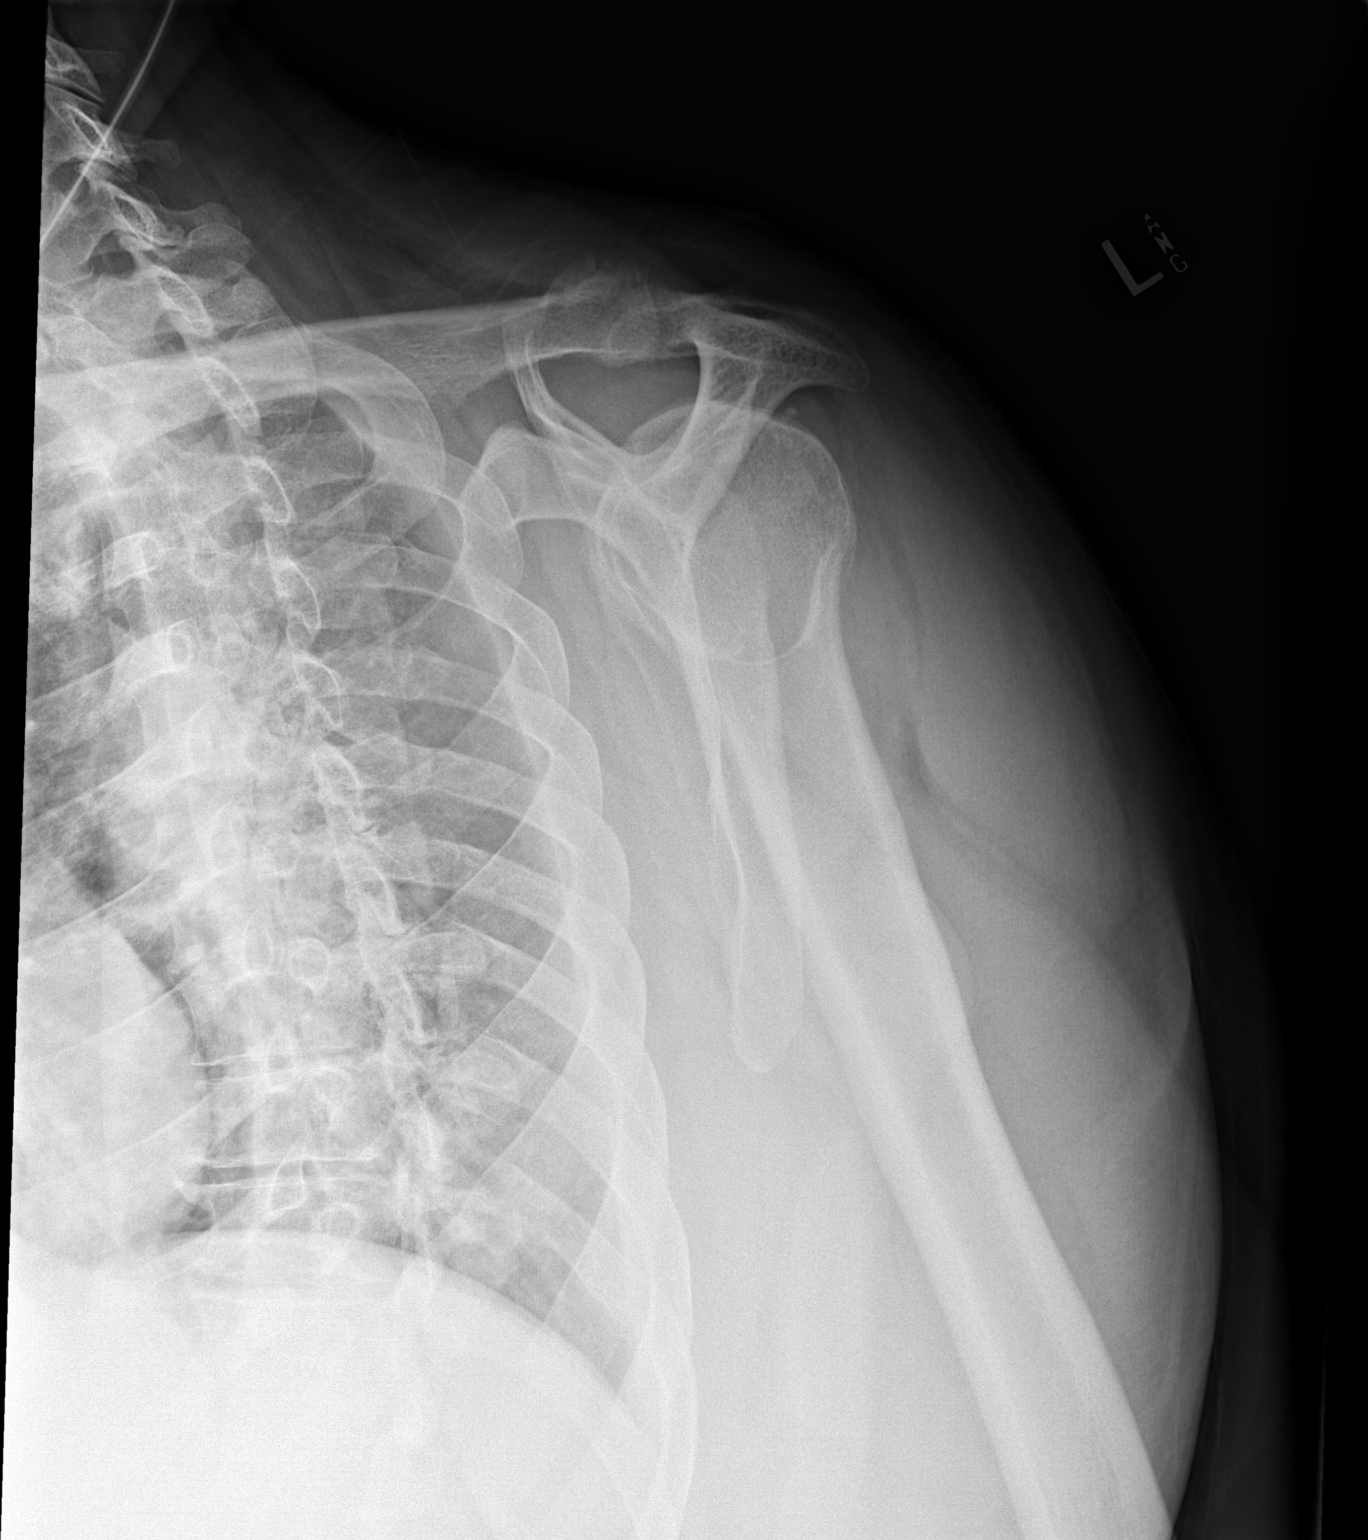

[w shoulder external left]
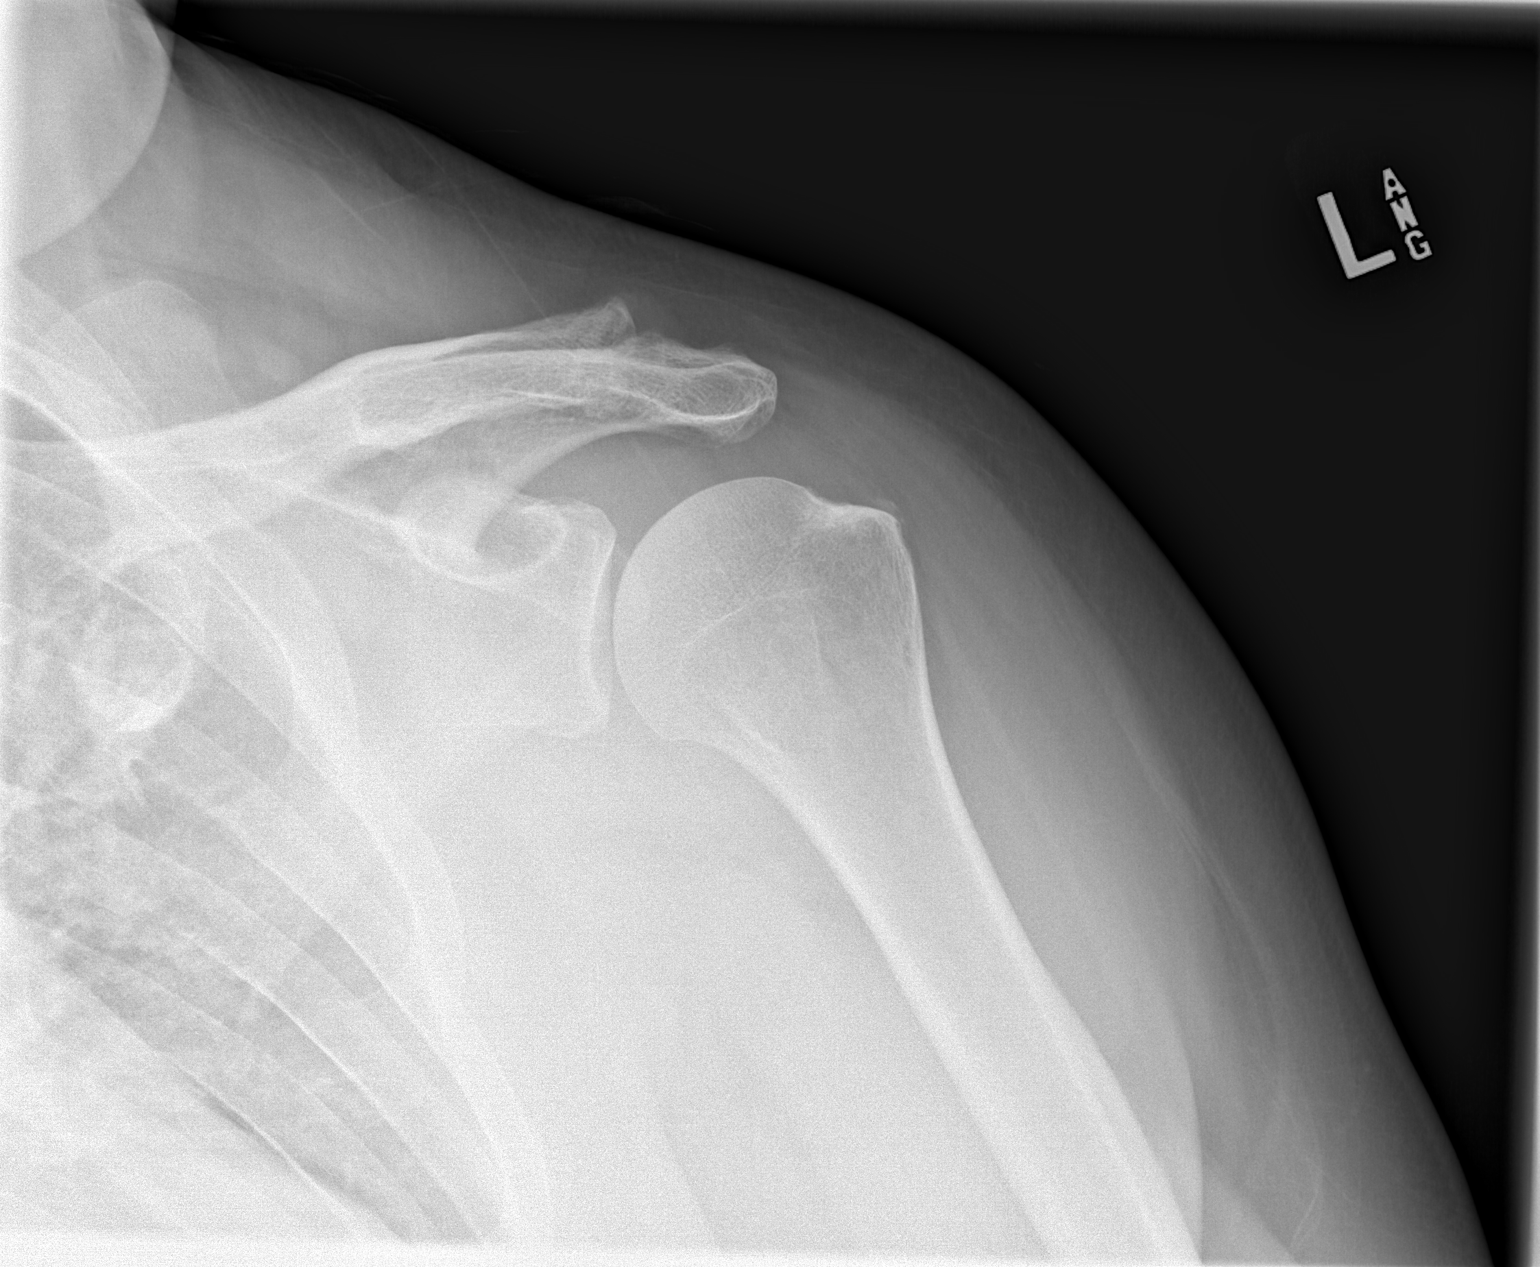

[2 of 2 positions shown; findings below may reference images not displayed]

FINDINGS: No osseous fracture or dislocation. No significant degenerative
change at the glenohumeral joint. Small calcification overlying the
superior-lateral humeral head, compatible with chronic calcific
tendinopathy of the rotator cuff insertion. Soft tissues about the
LEFT shoulder are otherwise unremarkable.
IMPRESSION: 1. No acute findings. No osseous fracture or dislocation.
2. Chronic calcific tendinopathy of the rotator cuff insertion.

## 2020-04-17 MED ORDER — OXYCODONE-ACETAMINOPHEN 5-325 MG PO TABS
1.0000 | ORAL_TABLET | Freq: Once | ORAL | Status: AC
  Administered 2020-04-17: 1 via ORAL
  Filled 2020-04-17: qty 1

## 2020-04-17 NOTE — ED Provider Notes (Addendum)
[facility] DEPT Provider Note   CSN: [id number] Arrival date & time: 04/17/20  1630     History Chief Complaint  Patient presents with  . Shoulder Injury    Jeffrey Barrera. is a 39 y.o. male.  HPI     39 y.o comes in with cc of fall and shoulder pain. He reports having intermittent episodes of dizzy spells. Today he fell on to his L side because of it. He reports L shoulder and arm pain. No numbness.   Pt has no associated headaches,  nausea, vomiting, seizures, loss of consciousness or new visual complains, weakness, numbness, dizziness or gait instability.   Past Medical History:  Diagnosis Date  . Anxiety   . Arthritis   . Asthma   . Blind left eye   . Claudication ([place]) 04/02/2017  . Coronary artery disease involving native coronary artery of native heart without angina pectoris 04/02/2017  . Depression   . Essential hypertension 03/07/2017  . Glucose intolerance (impaired glucose tolerance) 03/15/2017  . Hypertension   . Inflamed external hemorrhoid   . Inflamed internal hemorrhoid   . Internal and external bleeding hemorrhoids 2019  . Myocardial infarction ([place])   . Paresthesia of lower extremity 04/02/2017  . Pure hypercholesterolemia 03/07/2017  . Snoring 03/13/2018  . Tobacco abuse 03/07/2017    Patient Active Problem List   Diagnosis Date Noted  . Asthma   . Sleep apnea 03/13/2018  . Claudication in peripheral vascular disease ([place]) 04/02/2017  . Coronary artery disease involving native coronary artery of native heart without angina pectoris 04/02/2017  . Paresthesia of lower extremity 04/02/2017  . Glucose intolerance (impaired glucose tolerance) 03/15/2017  . Essential hypertension 03/07/2017  . Pure hypercholesterolemia 03/07/2017  . Tobacco abuse 03/07/2017    Past Surgical History:  Procedure Laterality Date  . CARDIAC CATHETERIZATION    . cardiac stents    . CORONARY ANGIOPLASTY    . LEFT HEART CATH AND  CORONARY ANGIOGRAPHY N/A 07/11/2018   Procedure: LEFT HEART CATH AND CORONARY ANGIOGRAPHY;  Surgeon: Nelva Bush, MD;  Location: [facility];  Service: Cardiovascular;  Laterality: N/A;       Family History  Problem Relation Age of Onset  . Hypertension Mother   . Heart attack Mother   . Heart disease Mother   . Hypertension Father     Social History   Tobacco Use  . Smoking status: Current Every Day Smoker    Types: Cigarettes  . Smokeless tobacco: Former Systems developer    Types: Chew    Quit date: 2000  . Tobacco comment: 4 cigarettes   Vaping Use  . Vaping Use: Never used  Substance Use Topics  . Alcohol use: Not Currently  . Drug use: Yes    Types: Marijuana    Home Medications Prior to Admission medications   Medication Sig Start Date End Date Taking? Authorizing Provider  albuterol (VENTOLIN HFA) 108 (90 Base) MCG/ACT inhaler Inhale 1-2 puffs into the lungs every 6 (six) hours as needed for wheezing or shortness of breath. 04/08/20   Vevelyn Francois, NP  amLODipine (NORVASC) 10 MG tablet Take 1 tablet (10 mg total) by mouth daily. 04/08/20 04/08/21  Vevelyn Francois, NP  atorvastatin (LIPITOR) 40 MG tablet Take 1 tablet (40 mg total) by mouth at bedtime. 04/08/20   Vevelyn Francois, NP  benzocaine (AMERICAINE) 20 % rectal ointment Place rectally every 3 (three) hours as needed for pain. 04/15/20   Edison Pace,  Diona Foley, NP  blood glucose meter kit and supplies Dispense based on patient and insurance preference. Use up to four times daily as directed. (FOR ICD-10 E10.9, E11.9). 02/26/20   Vevelyn Francois, NP  Blood Glucose Monitoring Suppl (TRUE METRIX METER) w/Device KIT Use as instructed 09/08/19   Gildardo Pounds, NP  buPROPion [facility] SR) 200 MG 12 hr tablet Take 1 tablet (200 mg total) by mouth 2 (two) times daily. 04/08/20 04/08/21  Vevelyn Francois, NP  chlorhexidine (PERIDEX) 0.12 % solution Use as directed 15 mLs in the mouth or throat 2 (two) times daily. 09/02/19    Gildardo Pounds, NP  clopidogrel (PLAVIX) 75 MG tablet Take 1 tablet (75 mg total) by mouth daily. 04/08/20 04/08/21  Vevelyn Francois, NP  furosemide (LASIX) 40 MG tablet Take 1 tablet (40 mg total) by mouth daily as needed for edema. 02/26/20 03/27/20  Vevelyn Francois, NP  gabapentin (NEURONTIN) 400 MG capsule Take 2 capsules (800 mg total) by mouth 3 (three) times daily. 04/08/20 04/08/21  Vevelyn Francois, NP  glucose blood (TRUE METRIX BLOOD GLUCOSE TEST) test strip Use as instructed. Check blood glucose level by fingerstick twice per day.  E11.65 09/08/19   Gildardo Pounds, NP  hydrocortisone (PROCTOSOL HC) 2.5 % rectal cream Place 1 application rectally 4 (four) times daily. 04/08/20   Vevelyn Francois, NP  Insulin Glargine (LANTUS) 100 UNIT/ML Solostar Pen Inject 30 Units into the skin daily at 10 pm. 09/08/19 10/08/19  Gildardo Pounds, NP  Insulin Pen Needle (B-D UF III MINI PEN NEEDLES) 31G X 5 MM MISC Use as instructed. Inject into the skin once nightly. 09/08/19   Gildardo Pounds, NP  losartan-hydrochlorothiazide (HYZAAR) 100-25 MG tablet Take 1 tablet by mouth daily. 04/08/20   Vevelyn Francois, NP  metFORMIN (GLUCOPHAGE) 500 MG tablet Take 2 tablets (1,000 mg total) by mouth 2 (two) times daily with a meal. 04/08/20 04/08/21  Vevelyn Francois, NP  methocarbamol (ROBAXIN) 500 MG tablet Take 1 tablet (500 mg total) by mouth every 8 (eight) hours as needed for muscle spasms. 04/08/20 04/08/21  Vevelyn Francois, NP  metoprolol tartrate (LOPRESSOR) 25 MG tablet Take 1 tablet (25 mg total) by mouth 2 (two) times daily. 04/08/20 04/08/21  Vevelyn Francois, NP  nitroGLYCERIN (NITROSTAT) 0.4 MG SL tablet Place 1 tablet (0.4 mg total) under the tongue every 5 (five) minutes as needed for chest pain. Patient not taking: Reported on 11/18/2018 07/04/18 10/02/18  Richardo Priest, MD  PARoxetine (PAXIL) 20 MG tablet Take 1 tablet (20 mg total) by mouth daily. 04/08/20 04/08/21  Vevelyn Francois, NP  PRAZOSIN HCL PO Take 1 mg  by mouth at bedtime.    [provider]  sulfamethoxazole-trimethoprim (BACTRIM DS) 800-160 MG tablet Take 1 tablet by mouth 2 (two) times daily for 7 days. 04/12/20 04/19/20  Vevelyn Francois, NP  SUMAtriptan (IMITREX) 100 MG tablet Take 1 tablet (100 mg total) by mouth every 2 (two) hours as needed for migraine (Max 2 tablets daily). Repeat in 2 hrs if headache persists 04/08/20 05/08/20  Vevelyn Francois, NP  traZODone (DESYREL) 150 MG tablet Take 1 tablet (150 mg total) by mouth at bedtime. 04/08/20 04/08/21  Vevelyn Francois, NP  TRUEplus Lancets 28G MISC Use as instructed. Check blood glucose level by fingerstick twice per day.  E11.65 09/08/19   Gildardo Pounds, NP    Allergies    Asa [aspirin],  Lemon flavor, Other, Penicillins, Iodine, Lisinopril, Clindamycin, and Erythromycin  Review of Systems   Review of Systems  Constitutional: Positive for activity change.  Respiratory: Negative for shortness of breath.   Cardiovascular: Negative for chest pain.  Gastrointestinal: Negative for vomiting.  Musculoskeletal: Positive for arthralgias.  Neurological: Negative for headaches.  Hematological: Does not bruise/bleed easily.    Physical Exam Updated Vital Signs BP (!) 146/99 (BP Location: Right Arm)   Pulse 91   Temp 98.6 F (37 C) (Oral)   Resp 19   Ht _0  (1.905 m)   Wt (!) 156.5 kg   SpO2 99%   BMI 43.13 kg/m   Physical Exam Vitals and nursing note reviewed.  Constitutional:      Appearance: He is well-developed.  HENT:     Head: Normocephalic and atraumatic.  Eyes:     Conjunctiva/sclera: Conjunctivae normal.     Pupils: Pupils are equal, round, and reactive to light.  Cardiovascular:     Rate and Rhythm: Normal rate and regular rhythm.  Pulmonary:     Effort: Pulmonary effort is normal.     Breath sounds: Normal breath sounds.  Abdominal:     General: Bowel sounds are normal. There is no distension.     Palpations: Abdomen is soft. There is no mass.      Tenderness: There is no abdominal tenderness. There is no guarding or rebound.  Musculoskeletal:        General: Tenderness present. No deformity.     Cervical back: Normal range of motion and neck supple.     Comments: L shoulder and distal humerus tenderness   Skin:    General: Skin is warm.  Neurological:     Mental Status: He is alert and oriented to person, place, and time.     ED Results / Procedures / Treatments   Labs (all labs ordered are listed, but only abnormal results are displayed) Labs Reviewed - No data to display  EKG None  Radiology DG Shoulder Left  Result Date: 04/17/2020 CLINICAL DATA:  Fall, pain. EXAM: LEFT SHOULDER - 2+ VIEW COMPARISON:  None. FINDINGS: No osseous fracture or dislocation. No significant degenerative change at the glenohumeral joint. Small calcification overlying the superior-lateral humeral head, compatible with chronic calcific tendinopathy of the rotator cuff insertion. Soft tissues about the LEFT shoulder are otherwise unremarkable. IMPRESSION: 1. No acute findings. No osseous fracture or dislocation. 2. 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 Electronically Signed   By: Franki Cabot M.D.   On: 04/17/2020 17:56   DG Humerus Left  Result Date: 04/17/2020 CLINICAL DATA:  Fall, pain. EXAM: LEFT HUMERUS - 2+ VIEW COMPARISON:  None. FINDINGS: There is no evidence of fracture or other focal bone lesions. Soft tissues are unremarkable. IMPRESSION: Negative. Electronically Signed   By: Franki Cabot M.D.   On: 04/17/2020 17:55    Procedures Procedures (including critical care time)  Medications Ordered in ED Medications  oxyCODONE-acetaminophen (PERCOCET/ROXICET) 5-325 MG per tablet 1 tablet (1 tablet Oral Given 04/17/20 1818)    ED Course  I have reviewed the triage vital signs and the nursing notes.  Pertinent labs & imaging results that were available during my care of the patient were reviewed by me and considered  in my medical decision making (see chart for details).    MDM Rules/Calculators/A&P  Pt comes in with cc of fall. Xrays ordered to r/o fracture. On wet read  It appears that there is no fracture. Likely has L sided shoulder contusion. RICE advised.  Final Clinical Impression(s) / ED Diagnoses Final diagnoses:  Contusion of left shoulder, initial encounter    Rx / DC Orders ED Discharge Orders    None       Varney Biles, MD 04/17/20 [place], Tyiana Hill, MD 04/18/20 1756

## 2020-04-17 NOTE — ED Triage Notes (Signed)
Patient reports fall and pain to left shoulder. Pain rated 10/10

## 2020-04-17 NOTE — Discharge Instructions (Addendum)
Shoulder Xray is showing tendinitis/rotator cuff injury. Please take motrin every 6 hours for the next 2-3 days and ice the shoulder. See your doctor in 1 week if the symptoms persists.

## 2020-05-28 ENCOUNTER — Other Ambulatory Visit: Payer: Self-pay

## 2020-05-28 ENCOUNTER — Ambulatory Visit (HOSPITAL_BASED_OUTPATIENT_CLINIC_OR_DEPARTMENT_OTHER): Payer: Medicaid Other | Attending: Nurse Practitioner | Admitting: Internal Medicine

## 2020-05-28 VITALS — Ht 75.0 in | Wt 333.0 lb

## 2020-05-28 DIAGNOSIS — G4733 Obstructive sleep apnea (adult) (pediatric): Secondary | ICD-10-CM | POA: Insufficient documentation

## 2020-06-06 DIAGNOSIS — G4733 Obstructive sleep apnea (adult) (pediatric): Secondary | ICD-10-CM

## 2020-06-06 NOTE — Procedures (Signed)
   Patient Name: Jeffrey Barrera, [place] Date: 05/28/2020 Gender: Male D.O.B: [birth year] Age (years): 33 Referring Provider: Thad Ranger NP Height (inches): 74 Interpreting Physician: Jetty Duhamel MD, ABSM Weight (lbs): 333 RPSGT: Cherylann Parr BMI: 43 MRN: [id number] Neck Size: 19.50  CLINICAL INFORMATION The patient is referred for a CPAP titration to treat sleep apnea. Date of NPSG, Split Night or HST: NPSG 04/03/20  AHI 11/ hr, desaturation to 82%, body weight 364 lbs  SLEEP STUDY TECHNIQUE As per the AASM Manual for the Scoring of Sleep and Associated Events v2.3 (April 2016) with a hypopnea requiring 4% desaturations.  The channels recorded and monitored were frontal, central and occipital EEG, electrooculogram (EOG), submentalis EMG (chin), nasal and oral airflow, thoracic and abdominal wall motion, anterior tibialis EMG, snore microphone, electrocardiogram, and pulse oximetry. Continuous positive airway pressure (CPAP) was initiated at the beginning of the study and titrated to treat sleep-disordered breathing.  MEDICATIONS Medications self-administered by patient taken the night of the study : PRAZOSIN, GABAPENTIN, TRAZODONE  TECHNICIAN COMMENTS Comments added by technician: Patient had difficulty initiating sleep. RESMED AIR-FIT F30 LARGE MASK WAS USED Comments added by scorer: N/A  RESPIRATORY PARAMETERS Optimal PAP Pressure (cm): 11 AHI at Optimal Pressure (/hr): 0.9 Overall Minimal O2 (%): 77.0 Supine % at Optimal Pressure (%): 0 Minimal O2 at Optimal Pressure (%): 85.0   SLEEP ARCHITECTURE The study was initiated at 10:21:19 PM and ended at 3:51:19 AM.  Sleep onset time was 1.4 minutes and the sleep efficiency was 88.6%%. The total sleep time was 292.5 minutes.  The patient spent 6.7%% of the night in stage N1 sleep, 83.1%% in stage N2 sleep, 0.0%% in stage N3 and 10.3% in REM.Stage REM latency was 269.0 minutes  Wake after sleep onset was 36.1. Alpha intrusion  was absent. Supine sleep was 5.81%.  CARDIAC DATA The 2 lead EKG demonstrated sinus rhythm. The mean heart rate was 61.2 beats per minute. Other EKG findings include: PVCs.  LEG MOVEMENT DATA The total Periodic Limb Movements of Sleep (PLMS) were 0. The PLMS index was 0.0. A PLMS index of <15 is considered normal in adults.  IMPRESSIONS - The optimal PAP pressure was 11 cm of water. - Central sleep apnea was not noted during this titration (CAI = 0.2/h). - Oxygen desaturations were observed during this titration (min O2 = 77.0%).Mean O2 sat on CPAP 11 was 93.5%. - No snoring was audible during this study. - 2-lead EKG demonstrated: PVCs - Clinically significant periodic limb movements were not noted during this study.   DIAGNOSIS - Obstructive Sleep Apnea (G47.33)  RECOMMENDATIONS - Trial of CPAP therapy on 11 cm H2O or utopap 5-15. - Patient used a Large size Resmed Full Face Mask AirFit F30 mask and heated humidification. - Be careful with alcohol, sedatives and other CNS depressants that may worsen sleep apnea and disrupt normal sleep architecture. - Sleep hygiene should be reviewed to assess factors that may improve sleep quality. - Weight management and regular exercise should be initiated or continued.  [Electronically signed] 06/06/2020 03:13 PM  Jetty Duhamel MD, ABSM Diplomate, American Board of Sleep Medicine   NPI: [id number]                         Jetty Duhamel Diplomate, American Board of Sleep Medicine  ELECTRONICALLY SIGNED ON:  06/06/2020, 3:09 PM [facility] PH: [phone]   FX: [phone] ACCREDITED BY THE AMERICAN ACADEMY OF SLEEP MEDICINE

## 2020-06-07 ENCOUNTER — Other Ambulatory Visit: Payer: Self-pay | Admitting: Nurse Practitioner

## 2020-06-07 DIAGNOSIS — G4733 Obstructive sleep apnea (adult) (pediatric): Secondary | ICD-10-CM

## 2020-06-07 NOTE — Progress Notes (Signed)
   [facility] Patient [facility] [address] [place], [place]  [address] Phone:  [phone]   Fax:  [phone]  Sleep Apnea   Mr. Stevie Kern completed his sleep study evaluation.  The diagnosis is as follows obstructive sleep apnea.  The recommendation is  Trial of CPAP therapy on 11 cm H2O or utopap 5-15. The orders have been placed for split sleep study and CPAP titration trial.

## 2020-06-18 ENCOUNTER — Other Ambulatory Visit: Payer: Self-pay

## 2020-06-18 ENCOUNTER — Ambulatory Visit: Payer: Self-pay | Attending: Nurse Practitioner

## 2020-06-29 ENCOUNTER — Encounter (HOSPITAL_COMMUNITY): Payer: Self-pay

## 2020-06-29 ENCOUNTER — Other Ambulatory Visit: Payer: Self-pay

## 2020-06-29 ENCOUNTER — Emergency Department (HOSPITAL_COMMUNITY)
Admission: EM | Admit: 2020-06-29 | Discharge: 2020-06-30 | Disposition: A | Discharge status: Home / Self Care | Payer: Medicaid Other | Attending: Emergency Medicine | Admitting: Emergency Medicine

## 2020-06-29 ENCOUNTER — Emergency Department (HOSPITAL_COMMUNITY): Payer: Medicaid Other

## 2020-06-29 DIAGNOSIS — Z794 Long term (current) use of insulin: Secondary | ICD-10-CM | POA: Insufficient documentation

## 2020-06-29 DIAGNOSIS — T730XXA Starvation, initial encounter: Secondary | ICD-10-CM | POA: Diagnosis not present

## 2020-06-29 DIAGNOSIS — I2510 Atherosclerotic heart disease of native coronary artery without angina pectoris: Secondary | ICD-10-CM | POA: Diagnosis not present

## 2020-06-29 DIAGNOSIS — F17210 Nicotine dependence, cigarettes, uncomplicated: Secondary | ICD-10-CM | POA: Diagnosis not present

## 2020-06-29 DIAGNOSIS — M549 Dorsalgia, unspecified: Secondary | ICD-10-CM | POA: Diagnosis not present

## 2020-06-29 DIAGNOSIS — Z741 Need for assistance with personal care: Secondary | ICD-10-CM | POA: Insufficient documentation

## 2020-06-29 DIAGNOSIS — I10 Essential (primary) hypertension: Secondary | ICD-10-CM | POA: Diagnosis not present

## 2020-06-29 DIAGNOSIS — R42 Dizziness and giddiness: Secondary | ICD-10-CM | POA: Insufficient documentation

## 2020-06-29 DIAGNOSIS — Z7902 Long term (current) use of antithrombotics/antiplatelets: Secondary | ICD-10-CM | POA: Insufficient documentation

## 2020-06-29 DIAGNOSIS — Z79899 Other long term (current) drug therapy: Secondary | ICD-10-CM | POA: Insufficient documentation

## 2020-06-29 DIAGNOSIS — J45909 Unspecified asthma, uncomplicated: Secondary | ICD-10-CM | POA: Insufficient documentation

## 2020-06-29 DIAGNOSIS — Z955 Presence of coronary angioplasty implant and graft: Secondary | ICD-10-CM | POA: Insufficient documentation

## 2020-06-29 DIAGNOSIS — Z7984 Long term (current) use of oral hypoglycemic drugs: Secondary | ICD-10-CM | POA: Insufficient documentation

## 2020-06-29 DIAGNOSIS — M542 Cervicalgia: Secondary | ICD-10-CM

## 2020-06-29 LAB — CBC
HCT: 47.5 % (ref 39.0–52.0)
Hemoglobin: 15.6 g/dL (ref 13.0–17.0)
MCH: 28.8 pg (ref 26.0–34.0)
MCHC: 32.8 g/dL (ref 30.0–36.0)
MCV: 87.6 fL (ref 80.0–100.0)
Platelets: 346 10*3/uL (ref 150–400)
RBC: 5.42 MIL/uL (ref 4.22–5.81)
RDW: 13 % (ref 11.5–15.5)
WBC: 10.7 10*3/uL — ABNORMAL HIGH (ref 4.0–10.5)
nRBC: 0 % (ref 0.0–0.2)

## 2020-06-29 LAB — BASIC METABOLIC PANEL
Anion gap: 14 (ref 5–15)
BUN: 5 mg/dL — ABNORMAL LOW (ref 6–20)
CO2: 24 mmol/L (ref 22–32)
Calcium: 9.5 mg/dL (ref 8.9–10.3)
Chloride: 100 mmol/L (ref 98–111)
Creatinine, Ser: 1.03 mg/dL (ref 0.61–1.24)
GFR, Estimated: >60 mL/min (ref >60)
Glucose, Bld: 290 mg/dL — ABNORMAL HIGH (ref 70–99)
Potassium: 3.4 mmol/L — ABNORMAL LOW (ref 3.5–5.1)
Sodium: 138 mmol/L (ref 135–145)
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
## 2020-06-29 MED ORDER — ACETAMINOPHEN 500 MG PO TABS: 1000.0000 mg | ORAL_TABLET | Freq: Once | ORAL | Status: DC
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
## 2020-06-29 NOTE — Discharge Instructions (Addendum)
Please return to the ER if you develop any new or worsening symptoms.
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
## 2020-06-30 ENCOUNTER — Other Ambulatory Visit: Payer: Self-pay

## 2020-06-30 ENCOUNTER — Emergency Department (HOSPITAL_COMMUNITY)
Admission: EM | Admit: 2020-06-30 | Discharge: 2020-06-30 | Disposition: A | Discharge status: Home / Self Care | Payer: Medicaid Other | Source: Home / Self Care | Attending: Emergency Medicine | Admitting: Emergency Medicine

## 2020-06-30 ENCOUNTER — Encounter (HOSPITAL_COMMUNITY): Payer: Self-pay | Admitting: Emergency Medicine

## 2020-06-30 DIAGNOSIS — Y92009 Unspecified place in unspecified non-institutional (private) residence as the place of occurrence of the external cause: Secondary | ICD-10-CM | POA: Insufficient documentation

## 2020-06-30 DIAGNOSIS — Z79899 Other long term (current) drug therapy: Secondary | ICD-10-CM | POA: Insufficient documentation

## 2020-06-30 DIAGNOSIS — I2510 Atherosclerotic heart disease of native coronary artery without angina pectoris: Secondary | ICD-10-CM | POA: Insufficient documentation

## 2020-06-30 DIAGNOSIS — W19XXXA Unspecified fall, initial encounter: Secondary | ICD-10-CM | POA: Insufficient documentation

## 2020-06-30 DIAGNOSIS — M542 Cervicalgia: Secondary | ICD-10-CM | POA: Insufficient documentation

## 2020-06-30 DIAGNOSIS — I10 Essential (primary) hypertension: Secondary | ICD-10-CM | POA: Insufficient documentation

## 2020-06-30 DIAGNOSIS — J45909 Unspecified asthma, uncomplicated: Secondary | ICD-10-CM | POA: Insufficient documentation

## 2020-06-30 DIAGNOSIS — Z741 Need for assistance with personal care: Secondary | ICD-10-CM | POA: Insufficient documentation

## 2020-06-30 DIAGNOSIS — Y9301 Activity, walking, marching and hiking: Secondary | ICD-10-CM | POA: Insufficient documentation

## 2020-06-30 DIAGNOSIS — F17210 Nicotine dependence, cigarettes, uncomplicated: Secondary | ICD-10-CM | POA: Insufficient documentation

## 2020-06-30 DIAGNOSIS — Z748 Other problems related to care provider dependency: Secondary | ICD-10-CM

## 2020-06-30 DIAGNOSIS — Z955 Presence of coronary angioplasty implant and graft: Secondary | ICD-10-CM | POA: Insufficient documentation

## 2020-06-30 DIAGNOSIS — Z794 Long term (current) use of insulin: Secondary | ICD-10-CM | POA: Insufficient documentation

## 2020-06-30 DIAGNOSIS — T730XXA Starvation, initial encounter: Secondary | ICD-10-CM | POA: Insufficient documentation

## 2020-06-30 MED ORDER — SODIUM CHLORIDE 0.9 % IV BOLUS
1000.0000 mL | Freq: Once | INTRAVENOUS | Status: AC
  Administered 2020-06-30: 1000 mL via INTRAVENOUS

## 2020-06-30 NOTE — ED Triage Notes (Addendum)
Pt presents to ED BIB [facility]. Pt c/o fall. Seen here for same earlier. NAD, EMS VSS
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
## 2020-06-30 NOTE — Progress Notes (Addendum)
12:15pm: CSW arranged for the patient to be picked up via [employer]. RN and patient aware of discharge plan.  10am: CSW spoke with patient to address his need for transportation - patient agreeable to discharge via [facility] Transportation department once ready. Patient reports he is going to receive IV fluids prior to discharge.  CSW to arrange transportation once patient is medically stable for discharge.  Edwin Dada, MSW, LCSW-A Transitions of Care  Clinical Social Worker  [facility] Emergency Departments  Medical ICU [phone]

## 2020-07-01 ENCOUNTER — Ambulatory Visit: Payer: Self-pay | Admitting: Nurse Practitioner

## 2020-07-12 ENCOUNTER — Ambulatory Visit: Payer: Self-pay | Admitting: Nurse Practitioner

## 2020-10-25 ENCOUNTER — Telehealth: Payer: Self-pay

## 2020-10-25 NOTE — Telephone Encounter (Signed)
Transition Care Management Unsuccessful Follow-up Telephone Call  Date of discharge and from where:  10/24/2020 from [facility]  Attempts:  1st Attempt  Reason for unsuccessful TCM follow-up call:  Left voice message

## 2020-10-26 NOTE — Telephone Encounter (Signed)
Transition Care Management Unsuccessful Follow-up Telephone Call  Date of discharge and from where:  10/24/2020 from [facility]  Attempts:  2nd Attempt  Reason for unsuccessful TCM follow-up call:  Left voice message

## 2020-10-27 NOTE — Telephone Encounter (Signed)
Transition Care Management Unsuccessful Follow-up Telephone Call  Date of discharge and from where:  10/24/2020 from [facility]  Attempts:  3rd Attempt  Reason for unsuccessful TCM follow-up call:  Unable to reach patient

## 2021-06-02 ENCOUNTER — Other Ambulatory Visit: Payer: Self-pay | Admitting: Nurse Practitioner

## 2021-06-02 DIAGNOSIS — G4733 Obstructive sleep apnea (adult) (pediatric): Secondary | ICD-10-CM

## 2021-07-11 ENCOUNTER — Ambulatory Visit (HOSPITAL_COMMUNITY)
Admission: EM | Admit: 2021-07-11 | Discharge: 2021-07-11 | Disposition: A | Discharge status: Home / Self Care | Payer: Medicaid Other | Attending: Sports Medicine | Admitting: Sports Medicine

## 2021-07-11 ENCOUNTER — Other Ambulatory Visit: Payer: Self-pay

## 2021-07-11 ENCOUNTER — Encounter (HOSPITAL_COMMUNITY): Payer: Self-pay

## 2021-07-11 DIAGNOSIS — R051 Acute cough: Secondary | ICD-10-CM

## 2021-07-11 DIAGNOSIS — H60311 Diffuse otitis externa, right ear: Secondary | ICD-10-CM | POA: Diagnosis not present

## 2021-07-11 DIAGNOSIS — J029 Acute pharyngitis, unspecified: Secondary | ICD-10-CM

## 2021-07-11 MED ORDER — BENZONATATE 100 MG PO CAPS: 100.0000 mg | ORAL_CAPSULE | Freq: Three times a day (TID) | ORAL | 0 refills | Status: DC

## 2021-07-11 MED ORDER — DOXYCYCLINE HYCLATE 100 MG PO CAPS: 100.0000 mg | ORAL_CAPSULE | Freq: Two times a day (BID) | ORAL | 0 refills | Status: AC

## 2021-07-11 NOTE — ED Provider Notes (Signed)
About [name]    CSN: [id number] Arrival date & time: 07/11/21  1936      History   Chief Complaint Chief Complaint  Patient presents with   Otalgia   Sore Throat   Cough    HPI Jeffrey Barrera. is a 40 y.o. male who presents for right otalgia, sore throat, nasal congestion, and cough x 7 days.  Patient had a sick close contact as well who had similar symptoms about 1 week ago. He states that about 1 week ago he had a right ear pain, had a little bit of drainage at that time.  Next few days he had nasal congestion and very sore throat.  He did have a temperature of 100.2 F.  After a few days of taking over-the-counter medication, his right ear started feeling a little bit better and so did his throat and nasal congestion, however over the last 2 days, he feels like he has had a resurgence of his symptoms.  His cough has been ongoing as well, sometimes dry sometimes clear mucus.  He has felt some chest congestion as well, but denies any chest pain that worsens with activity.  He denies any wheezing or acute shortness of breath.  Although over the last 2 days he feels more fatigued and ill.  He denies any abdominal pain or nausea.  He has been using over-the-counter cough suppressant and cough drops without much relief.  Reports he has a history of throat closure with penicillins and erythromycin antibiotics.   Past Medical History:  Diagnosis Date   Anxiety    Arthritis    Asthma    Blind left eye    Claudication ([place]) 04/02/2017   Coronary artery disease involving native coronary artery of native heart without angina pectoris 04/02/2017   Depression    Essential hypertension 03/07/2017   Glucose intolerance (impaired glucose tolerance) 03/15/2017   Hypertension    Inflamed external hemorrhoid    Inflamed internal hemorrhoid    Internal and external bleeding hemorrhoids 2019   Myocardial infarction ([place])    Paresthesia of lower extremity 04/02/2017   Pure  hypercholesterolemia 03/07/2017   Snoring 03/13/2018   Tobacco abuse 03/07/2017    Patient Active Problem List   Diagnosis Date Noted   Asthma    Sleep apnea 03/13/2018   Claudication in peripheral vascular disease ([place]) 04/02/2017   Coronary artery disease involving native coronary artery of native heart without angina pectoris 04/02/2017   Paresthesia of lower extremity 04/02/2017   Glucose intolerance (impaired glucose tolerance) 03/15/2017   Essential hypertension 03/07/2017   Pure hypercholesterolemia 03/07/2017   Tobacco abuse 03/07/2017    Past Surgical History:  Procedure Laterality Date   CARDIAC CATHETERIZATION     cardiac stents     CORONARY ANGIOPLASTY     LEFT HEART CATH AND CORONARY ANGIOGRAPHY N/A 07/11/2018   Procedure: LEFT HEART CATH AND CORONARY ANGIOGRAPHY;  Surgeon: Nelva Bush, MD;  Location: [place] [facility];  Service: Cardiovascular;  Laterality: N/A;       Home Medications    Prior to Admission medications   Medication Sig Start Date End Date Taking? Authorizing Provider  benzonatate (TESSALON) 100 MG capsule Take 1 capsule (100 mg total) by mouth every 8 (eight) hours. 07/11/21  Yes Elba Barman, DO  doxycycline (VIBRAMYCIN) 100 MG capsule Take 1 capsule (100 mg total) by mouth 2 (two) times daily for 7 days. 07/11/21 07/18/21 Yes Elba Barman, DO  albuterol (VENTOLIN HFA) 108 (90  Base) MCG/ACT inhaler Inhale 1-2 puffs into the lungs every 6 (six) hours as needed for wheezing or shortness of breath. 04/08/20   Vevelyn Francois, NP  amLODipine (NORVASC) 10 MG tablet Take 1 tablet (10 mg total) by mouth daily. 04/08/20 04/08/21  Vevelyn Francois, NP  atorvastatin (LIPITOR) 40 MG tablet Take 1 tablet (40 mg total) by mouth at bedtime. 04/08/20   Vevelyn Francois, NP  benzocaine (AMERICAINE) 20 % rectal ointment Place rectally every 3 (three) hours as needed for pain. 04/15/20   Vevelyn Francois, NP  blood glucose meter kit and supplies Dispense based on  patient and insurance preference. Use up to four times daily as directed. (FOR ICD-10 E10.9, E11.9). 02/26/20   Vevelyn Francois, NP  Blood Glucose Monitoring Suppl (TRUE METRIX METER) w/Device KIT Use as instructed 09/08/19   Gildardo Pounds, NP  buPROPion [facility] SR) 200 MG 12 hr tablet Take 1 tablet (200 mg total) by mouth 2 (two) times daily. Patient not taking: Reported on 06/30/2020 04/08/20 04/08/21  Vevelyn Francois, NP  chlorhexidine (PERIDEX) 0.12 % solution Use as directed 15 mLs in the mouth or throat 2 (two) times daily. Patient taking differently: Use as directed 15 mLs in the mouth or throat 2 (two) times daily as needed (sore throat).  09/02/19   Gildardo Pounds, NP  furosemide (LASIX) 40 MG tablet Take 1 tablet (40 mg total) by mouth daily as needed for edema. 02/26/20 06/30/20  Vevelyn Francois, NP  gabapentin (NEURONTIN) 400 MG capsule Take 2 capsules (800 mg total) by mouth 3 (three) times daily. 04/08/20 04/08/21  Vevelyn Francois, NP  glucose blood (TRUE METRIX BLOOD GLUCOSE TEST) test strip Use as instructed. Check blood glucose level by fingerstick twice per day.  E11.65 09/08/19   Gildardo Pounds, NP  hydrocortisone (PROCTOSOL HC) 2.5 % rectal cream Place 1 application rectally 4 (four) times daily. 04/08/20   Vevelyn Francois, NP  Insulin Pen Needle (B-D UF III MINI PEN NEEDLES) 31G X 5 MM MISC Use as instructed. Inject into the skin once nightly. 09/08/19   Gildardo Pounds, NP  losartan-hydrochlorothiazide (HYZAAR) 100-25 MG tablet Take 1 tablet by mouth daily. 04/08/20   Vevelyn Francois, NP  metFORMIN (GLUCOPHAGE) 500 MG tablet Take 2 tablets (1,000 mg total) by mouth 2 (two) times daily with a meal. Patient not taking: Reported on 06/30/2020 04/08/20 04/08/21  Vevelyn Francois, NP  metoprolol tartrate (LOPRESSOR) 25 MG tablet Take 1 tablet (25 mg total) by mouth 2 (two) times daily. 04/08/20 04/08/21  Vevelyn Francois, NP  nitroGLYCERIN (NITROSTAT) 0.4 MG SL tablet Place 1 tablet  (0.4 mg total) under the tongue every 5 (five) minutes as needed for chest pain. 07/04/18 06/30/20  Richardo Priest, MD  PARoxetine (PAXIL) 20 MG tablet Take 1 tablet (20 mg total) by mouth daily. 04/08/20 04/08/21  Vevelyn Francois, NP  PRAZOSIN HCL PO Take 1 mg by mouth at bedtime.    [provider]  SUMAtriptan (IMITREX) 100 MG tablet Take 1 tablet (100 mg total) by mouth every 2 (two) hours as needed for migraine (Max 2 tablets daily). Repeat in 2 hrs if headache persists 04/08/20 06/30/20  Vevelyn Francois, NP  traZODone (DESYREL) 150 MG tablet Take 1 tablet (150 mg total) by mouth at bedtime. Patient taking differently: Take 200 mg by mouth at bedtime.  04/08/20 04/08/21  Vevelyn Francois, NP  TRUEplus Lancets 28G MISC  Use as instructed. Check blood glucose level by fingerstick twice per day.  E11.65 09/08/19   Gildardo Pounds, NP    Family History Family History  Problem Relation Age of Onset   Hypertension Mother    Heart attack Mother    Heart disease Mother    Hypertension Father     Social History Social History   Tobacco Use   Smoking status: Every Day    Types: Cigarettes   Smokeless tobacco: Former    Types: Chew    Quit date: 2000   Tobacco comments:    4 cigarettes   Vaping Use   Vaping Use: Never used  Substance Use Topics   Alcohol use: Not Currently   Drug use: Yes    Types: Marijuana     Allergies   Asa [aspirin], Lemon flavor, Other, Penicillins, Iodine, Lisinopril, Clindamycin, and Erythromycin   Review of Systems Review of Systems  Constitutional:  Positive for chills and fatigue.  HENT:  Positive for congestion, ear discharge, ear pain and sore throat.   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   Gastrointestinal:  Negative for abdominal pain and nausea.  Neurological:  Positive for headaches. Negative for light-headedness.    Physical Exam Triage Vital Signs ED Triage Vitals  Enc Vitals Group      BP 07/11/21 2007 138/85     Pulse Rate 07/11/21 2007 93     Resp 07/11/21 2007 17     Temp 07/11/21 2007 98.2 F (36.8 C)     Temp Source 07/11/21 2007 Oral     SpO2 07/11/21 2007 94 %     Weight --      Height --      Head Circumference --      Peak Flow --      Pain Score 07/11/21 2004 8     Pain Loc --      Pain Edu? --      Excl. in [place]? --    No data found.  Updated Vital Signs BP 138/85 (BP Location: Left Arm)   Pulse 93   Temp 98.2 F (36.8 C) (Oral)   Resp 17   SpO2 94%   Physical Exam Constitutional:      Appearance: He is well-developed. He is ill-appearing. He is not toxic-appearing.  HENT:     Head: Normocephalic and atraumatic.     Right Ear: Swelling present. A middle ear effusion is present. Tympanic membrane is erythematous.     Nose: Congestion and rhinorrhea (clear) present.     Mouth/Throat:     Mouth: Mucous membranes are moist.     Pharynx: Posterior oropharyngeal erythema present. No oropharyngeal exudate.     Tonsils: No tonsillar exudate or tonsillar abscesses.  Eyes:     Conjunctiva/sclera: Conjunctivae normal.     Pupils: Pupils are equal, round, and reactive to light.  Cardiovascular:     Rate and Rhythm: Normal rate.     Heart sounds: Normal heart sounds.  Pulmonary:     Effort: Pulmonary effort is normal.     Breath sounds: Normal breath sounds. No wheezing, rhonchi or rales.  Abdominal:     Palpations: Abdomen is soft.  Skin:    General: Skin is warm.  Neurological:     Mental Status: He is alert.  Psychiatric:        Behavior: Behavior normal.     UC Treatments /  Results  Labs (all labs ordered are listed, but only abnormal results are displayed) Labs Reviewed - No data to display  EKG   Radiology No results found.  Procedures Procedures (including critical care time)  Medications Ordered in UC Medications - No data to display  Initial Impression / Assessment and Plan / UC Course  I have reviewed the triage vital  signs and the nursing notes.  Pertinent labs & imaging results that were available during my care of the patient were reviewed by me and considered in my medical decision making (see chart for details).     The patient is ill-appearing, however nontoxic.  He does have evidence of acute otitis externa.  He has also been having congestion as well as intermittent fever/chills and headache, that are somewhat concerning for pharyngitis versus sinusitis.  This could be viral in nature, however given his reemergence of symptoms greater than 1 week apart, I am more so concerned for second sickening.  Given this and his known otitis externa, we will treat with antibiotic therapy.  He has significant allergy to penicillin, mycins antibiotics, so will treat with Doxycycline 153m BID x 7 days.  Given his cough, we will treat with conservative measures and I also sent a prescription for Tessalon Perles.  Discussed increasing fluid hydration, adequate rest and other therapeutic measures.  I did provide strict return precautions, and encouraged patient to follow-up with his PCP later in the week to ensure that he was improving.  He thought he might be able to get an appointment this Friday with his PCP.  Patient is safe for discharge home. Final Clinical Impressions(s) / UC Diagnoses   Final diagnoses:  Acute cough  Pharyngitis, unspecified etiology  Acute diffuse otitis externa of right ear     Discharge Instructions      Doxycycline 1035mBID x 7 days Throat lozenges, cough drops, honey, warm water/tea for throat Tessalon Perles for cough  F/u with PCP over the next 2-5 days to ensure getting better     ED Prescriptions     Medication Sig Dispense Auth. Provider   doxycycline (VIBRAMYCIN) 100 MG capsule Take 1 capsule (100 mg total) by mouth 2 (two) times daily for 7 days. 14 capsule BrElba BarmanDO   benzonatate (TESSALON) 100 MG capsule Take 1 capsule (100 mg total) by mouth every 8 (eight)  hours. 21 capsule BrElba BarmanDO      PDMP not reviewed this encounter.   BrElba BarmanDO 07/11/21 2133

## 2021-07-11 NOTE — ED Triage Notes (Signed)
Pt presents with non productive cough, congestion, sore throat, and bilateral ear pain for about a week.

## 2021-07-11 NOTE — Discharge Instructions (Signed)
Doxycycline 100mg  BID x 7 days Throat lozenges, cough drops, honey, warm water/tea for throat Tessalon Perles for cough  F/u with PCP over the next 2-5 days to ensure getting better

## 2021-09-20 ENCOUNTER — Ambulatory Visit (INDEPENDENT_AMBULATORY_CARE_PROVIDER_SITE_OTHER): Payer: Medicaid Other | Admitting: Podiatry

## 2021-09-20 DIAGNOSIS — Z91199 Patient's noncompliance with other medical treatment and regimen due to unspecified reason: Secondary | ICD-10-CM

## 2021-09-20 NOTE — Progress Notes (Signed)
Patient was no-show for appointment today 

## 2021-12-18 ENCOUNTER — Emergency Department (HOSPITAL_COMMUNITY)
Admission: EM | Admit: 2021-12-18 | Discharge: 2021-12-18 | Disposition: A | Discharge status: Home / Self Care | Attending: Emergency Medicine | Admitting: Emergency Medicine

## 2021-12-18 ENCOUNTER — Emergency Department (HOSPITAL_COMMUNITY)

## 2021-12-18 ENCOUNTER — Encounter (HOSPITAL_COMMUNITY): Payer: Self-pay

## 2021-12-18 ENCOUNTER — Other Ambulatory Visit: Payer: Self-pay

## 2021-12-18 DIAGNOSIS — R39198 Other difficulties with micturition: Secondary | ICD-10-CM | POA: Diagnosis not present

## 2021-12-18 DIAGNOSIS — R109 Unspecified abdominal pain: Secondary | ICD-10-CM | POA: Diagnosis present

## 2021-12-18 DIAGNOSIS — Z794 Long term (current) use of insulin: Secondary | ICD-10-CM | POA: Insufficient documentation

## 2021-12-18 LAB — CBC WITH DIFFERENTIAL/PLATELET
Abs Immature Granulocytes: 0.02 10*3/uL (ref 0.00–0.07)
Basophils Absolute: 0 10*3/uL (ref 0.0–0.1)
Basophils Relative: 1 %
Eosinophils Absolute: 0.3 10*3/uL (ref 0.0–0.5)
Eosinophils Relative: 4 %
HCT: 41.4 % (ref 39.0–52.0)
Hemoglobin: 13.9 g/dL (ref 13.0–17.0)
Immature Granulocytes: 0 %
Lymphocytes Relative: 39 %
Lymphs Abs: 3.1 10*3/uL (ref 0.7–4.0)
MCH: 30.8 pg (ref 26.0–34.0)
MCHC: 33.6 g/dL (ref 30.0–36.0)
MCV: 91.6 fL (ref 80.0–100.0)
Monocytes Absolute: 0.6 10*3/uL (ref 0.1–1.0)
Monocytes Relative: 7 %
Neutro Abs: 4 10*3/uL (ref 1.7–7.7)
Neutrophils Relative %: 49 %
Platelets: 330 10*3/uL (ref 150–400)
RBC: 4.52 MIL/uL (ref 4.22–5.81)
RDW: 13.8 % (ref 11.5–15.5)
WBC: 8.1 10*3/uL (ref 4.0–10.5)
nRBC: 0 % (ref 0.0–0.2)

## 2021-12-18 LAB — BASIC METABOLIC PANEL
Anion gap: 7 (ref 5–15)
BUN: 13 mg/dL (ref 6–20)
CO2: 31 mmol/L (ref 22–32)
Calcium: 10 mg/dL (ref 8.9–10.3)
Chloride: 103 mmol/L (ref 98–111)
Creatinine, Ser: 1.04 mg/dL (ref 0.61–1.24)
GFR, Estimated: >60 mL/min (ref >60)
Glucose, Bld: 140 mg/dL — ABNORMAL HIGH (ref 70–99)
Potassium: 3.8 mmol/L (ref 3.5–5.1)
Sodium: 141 mmol/L (ref 135–145)

## 2021-12-18 MED ORDER — KETOROLAC TROMETHAMINE 15 MG/ML IJ SOLN
15.0000 mg | Freq: Once | INTRAMUSCULAR | Status: AC
  Administered 2021-12-18: 15 mg via INTRAVENOUS
  Filled 2021-12-18: qty 1

## 2021-12-18 NOTE — ED Provider Notes (Signed)
?Jeffrey Barrera ?Provider Note ? ? ?CSN: [id number] ?Arrival date & time: 12/18/21  2015 ? ?  ? ?History ? ?Chief Complaint  ?Patient presents with  ? Flank Pain  ?  Pt states he had BL kidney stones and has been unable to void since thurs  ? ? ?Jeffrey Barrera. is a 41 y.o. male. ? ?Patient presents to ER chief complaint of concern that he has a kidney stone.  He states that he has not been able to urinate all day today.  States he feels like he needs to go but has not made any urine.  Complaining of some left-sided flank pain as well.  No fevers no cough no vomiting no diarrhea reported. ? ? ?  ? ?Home Medications ?Prior to Admission medications   ?Medication Sig Start Date End Date Taking? Authorizing Provider  ?albuterol (VENTOLIN HFA) 108 (90 Base) MCG/ACT inhaler Inhale 1-2 puffs into the lungs every 6 (six) hours as needed for wheezing or shortness of breath. 04/08/20   Vevelyn Francois, NP  ?amLODipine (NORVASC) 10 MG tablet Take 1 tablet (10 mg total) by mouth daily. 04/08/20 04/08/21  Vevelyn Francois, NP  ?atorvastatin (LIPITOR) 40 MG tablet Take 1 tablet (40 mg total) by mouth at bedtime. 04/08/20   Vevelyn Francois, NP  ?benzocaine ([place]ERICAINE) 20 % rectal ointment Place rectally every 3 (three) hours as needed for pain. 04/15/20   Vevelyn Francois, NP  ?benzonatate (TESSALON) 100 MG capsule Take 1 capsule (100 mg total) by mouth every 8 (eight) hours. 07/11/21   Elba Barman, DO  ?blood glucose meter kit and supplies Dispense based on patient and insurance preference. Use up to four times daily as directed. (FOR ICD-10 E10.9, E11.9). 02/26/20   Vevelyn Francois, NP  ?Blood Glucose Monitoring Suppl (TRUE METRIX METER) w/Device KIT Use as instructed 09/08/19   Gildardo Pounds, NP  ?buPROPion [facility] SR) 200 MG 12 hr tablet Take 1 tablet (200 mg total) by mouth 2 (two) times daily. ?Patient not taking: Reported on 06/30/2020 04/08/20 04/08/21  Vevelyn Francois, NP  ?chlorhexidine  (PERIDEX) 0.12 % solution Use as directed 15 mLs in the mouth or throat 2 (two) times daily. ?Patient taking differently: Use as directed 15 mLs in the mouth or throat 2 (two) times daily as needed (sore throat).  09/02/19   Gildardo Pounds, NP  ?furosemide (LASIX) 40 MG tablet Take 1 tablet (40 mg total) by mouth daily as needed for edema. 02/26/20 06/30/20  Vevelyn Francois, NP  ?gabapentin (NEURONTIN) 400 MG capsule Take 2 capsules (800 mg total) by mouth 3 (three) times daily. 04/08/20 04/08/21  Vevelyn Francois, NP  ?glucose blood (TRUE METRIX BLOOD GLUCOSE TEST) test strip Use as instructed. Check blood glucose level by fingerstick twice per day.  E11.65 09/08/19   Gildardo Pounds, NP  ?hydrocortisone (PROCTOSOL HC) 2.5 % rectal cream Place 1 application rectally 4 (four) times daily. 04/08/20   Vevelyn Francois, NP  ?Insulin Pen Needle (B-D UF III MINI PEN NEEDLES) 31G X 5 MM MISC Use as instructed. Inject into the skin once nightly. 09/08/19   Gildardo Pounds, NP  ?losartan-hydrochlorothiazide (HYZAAR) 100-25 MG tablet Take 1 tablet by mouth daily. 04/08/20   Vevelyn Francois, NP  ?metFORMIN (GLUCOPHAGE) 500 MG tablet Take 2 tablets (1,000 mg total) by mouth 2 (two) times daily with a meal. ?Patient not taking: Reported on 06/30/2020 04/08/20 04/08/21  Dionisio David  M, NP  ?metoprolol tartrate (LOPRESSOR) 25 MG tablet Take 1 tablet (25 mg total) by mouth 2 (two) times daily. 04/08/20 04/08/21  Vevelyn Francois, NP  ?nitroGLYCERIN (NITROSTAT) 0.4 MG SL tablet Place 1 tablet (0.4 mg total) under the tongue every 5 (five) minutes as needed for chest pain. 07/04/18 06/30/20  Richardo Priest, MD  ?PARoxetine (PAXIL) 20 MG tablet Take 1 tablet (20 mg total) by mouth daily. 04/08/20 04/08/21  Vevelyn Francois, NP  ?PRAZOSIN HCL PO Take 1 mg by mouth at bedtime.    [provider]  ?SUMAtriptan (IMITREX) 100 MG tablet Take 1 tablet (100 mg total) by mouth every 2 (two) hours as needed for migraine (Max 2 tablets daily).  Repeat in 2 hrs if headache persists 04/08/20 06/30/20  Vevelyn Francois, NP  ?traZODone (DESYREL) 150 MG tablet Take 1 tablet (150 mg total) by mouth at bedtime. ?Patient taking differently: Take 200 mg by mouth at bedtime.  04/08/20 04/08/21  Vevelyn Francois, NP  ?TRUEplus Lancets 28G MISC Use as instructed. Check blood glucose level by fingerstick twice per day.  E11.65 09/08/19   Gildardo Pounds, NP  ?   ? ?Allergies    ?Asa [aspirin], Lemon flavor, Other, Penicillins, Iodine, Lisinopril, Clindamycin, and Erythromycin   ? ?Review of Systems   ?Review of Systems  ?Constitutional:  Negative for fever.  ?HENT:  Negative for ear pain and sore throat.   ?Eyes:  Negative for pain.  ?Respiratory:  Negative for cough.   ?Cardiovascular:  Negative for chest pain.  ?Gastrointestinal:  Negative for abdominal pain.  ?Genitourinary:  Positive for flank pain.  ?Musculoskeletal:  Negative for back pain.  ?Skin:  Negative for color change and rash.  ?Neurological:  Negative for syncope.  ?All other systems reviewed and are negative. ? ?Physical Exam ?Updated Vital Signs ?BP 131/70   Pulse 76   Ht _0  (1.905 m)   Wt 121.5 kg   SpO2 99%   BMI 33.48 kg/m?  ?Physical Exam ?Constitutional:   ?   Appearance: He is well-developed.  ?HENT:  ?   Head: Normocephalic.  ?   Nose: Nose normal.  ?Eyes:  ?   Extraocular Movements: Extraocular movements intact.  ?Cardiovascular:  ?   Rate and Rhythm: Normal rate.  ?Pulmonary:  ?   Effort: Pulmonary effort is normal.  ?Abdominal:  ?   Tenderness: There is no abdominal tenderness. There is no guarding or rebound.  ?Skin: ?   Coloration: Skin is not jaundiced.  ?Neurological:  ?   Mental Status: He is alert. Mental status is at baseline.  ? ? ?ED Results / Procedures / Treatments   ?Labs ?(all labs ordered are listed, but only abnormal results are displayed) ?Labs Reviewed  ?BASIC METABOLIC PANEL - Abnormal; Notable for the following components:  ?    Result Value  ? Glucose, Bld 140 (*)   ?  All other components within normal limits  ?CBC WITH DIFFERENTIAL/PLATELET  ? ? ?EKG ?None ? ?Radiology ?CT Renal Stone Study ? ?Result Date: 12/18/2021 ?CLINICAL DATA:  Flank pain, kidney stone suspected EXAM: CT ABDOMEN AND PELVIS WITHOUT CONTRAST TECHNIQUE: Multidetector CT imaging of the abdomen and pelvis was performed following the standard protocol without IV contrast. RADIATION DOSE REDUCTION: This exam was performed according to the departmental dose-optimization program which includes automated exposure control, adjustment of the mA and/or kV according to patient size and/or use of iterative reconstruction technique. COMPARISON:  None. FINDINGS: Lower  chest: No acute abnormality Hepatobiliary: Scattered small subcentimeter hypodensities in the liver, most likely small cysts. No suspicious focal hepatic abnormality or biliary ductal dilatation. Gallbladder contracted, grossly unremarkable. Pancreas: No focal abnormality or ductal dilatation. Spleen: No focal abnormality.  Normal size. Adrenals/Urinary Tract: No adrenal abnormality. No focal renal abnormality. No stones or hydronephrosis. Urinary bladder is unremarkable. Stomach/Bowel: Normal appendix. Stomach, large and small bowel grossly unremarkable. Vascular/Lymphatic: No evidence of aneurysm or adenopathy. Scattered aortic atherosclerosis. Reproductive: No visible focal abnormality. Other: No free fluid or free air. Musculoskeletal: No acute bony abnormality. IMPRESSION: No renal or ureteral stones.  No hydronephrosis. No acute findings. Aortic atherosclerosis. Electronically Signed   By: Rolm Baptise M.D.   On: 12/18/2021 22:00   ? ?Procedures ?Procedures  ? ? ?Medications Ordered in ED ?Medications  ?ketorolac (TORADOL) 15 MG/ML injection 15 mg (15 mg Intravenous Given 12/18/21 2157)  ? ? ?ED Course/ Medical Decision Making/ A&P ?  ?                        ?Medical Decision Making ?Amount and/or Complexity of Data Reviewed ?Labs: ordered. ?Radiology:  ordered. ? ?Risk ?Prescription drug management. ? ? ?Chart review shows primary care office visit October 17 1021 for new appointment. ? ?Cardiac monitoring shows sinus rhythm. ? ?Work-up today included labs

## 2021-12-18 NOTE — ED Triage Notes (Signed)
Pt has history of kidney stones and has been unable to void since thursday ?

## 2021-12-18 NOTE — Discharge Instructions (Signed)
Call your primary care doctor or specialist as discussed in the next 2-3 days.   Return immediately back to the ER if:  Your symptoms worsen within the next 12-24 hours. You develop new symptoms such as new fevers, persistent vomiting, new pain, shortness of breath, or new weakness or numbness, or if you have any other concerns.  

## 2022-01-13 ENCOUNTER — Emergency Department (HOSPITAL_COMMUNITY)

## 2022-01-13 ENCOUNTER — Encounter (HOSPITAL_COMMUNITY): Payer: Self-pay | Admitting: Emergency Medicine

## 2022-01-13 ENCOUNTER — Observation Stay (HOSPITAL_COMMUNITY): Admission: EM | Admit: 2022-01-13 | Discharge: 2022-01-14 | Attending: Internal Medicine | Admitting: Internal Medicine

## 2022-01-13 DIAGNOSIS — Z955 Presence of coronary angioplasty implant and graft: Secondary | ICD-10-CM | POA: Insufficient documentation

## 2022-01-13 DIAGNOSIS — N400 Enlarged prostate without lower urinary tract symptoms: Secondary | ICD-10-CM | POA: Diagnosis present

## 2022-01-13 DIAGNOSIS — M792 Neuralgia and neuritis, unspecified: Secondary | ICD-10-CM

## 2022-01-13 DIAGNOSIS — Z20822 Contact with and (suspected) exposure to covid-19: Secondary | ICD-10-CM | POA: Diagnosis not present

## 2022-01-13 DIAGNOSIS — S8991XA Unspecified injury of right lower leg, initial encounter: Secondary | ICD-10-CM | POA: Diagnosis not present

## 2022-01-13 DIAGNOSIS — I10 Essential (primary) hypertension: Secondary | ICD-10-CM | POA: Diagnosis present

## 2022-01-13 DIAGNOSIS — F17210 Nicotine dependence, cigarettes, uncomplicated: Secondary | ICD-10-CM | POA: Diagnosis not present

## 2022-01-13 DIAGNOSIS — I2510 Atherosclerotic heart disease of native coronary artery without angina pectoris: Secondary | ICD-10-CM | POA: Insufficient documentation

## 2022-01-13 DIAGNOSIS — Z8673 Personal history of transient ischemic attack (TIA), and cerebral infarction without residual deficits: Secondary | ICD-10-CM | POA: Diagnosis not present

## 2022-01-13 DIAGNOSIS — E119 Type 2 diabetes mellitus without complications: Secondary | ICD-10-CM

## 2022-01-13 DIAGNOSIS — R2990 Unspecified symptoms and signs involving the nervous system: Secondary | ICD-10-CM

## 2022-01-13 DIAGNOSIS — W19XXXA Unspecified fall, initial encounter: Secondary | ICD-10-CM

## 2022-01-13 DIAGNOSIS — R202 Paresthesia of skin: Secondary | ICD-10-CM | POA: Diagnosis present

## 2022-01-13 DIAGNOSIS — Z7902 Long term (current) use of antithrombotics/antiplatelets: Secondary | ICD-10-CM | POA: Insufficient documentation

## 2022-01-13 DIAGNOSIS — J45909 Unspecified asthma, uncomplicated: Secondary | ICD-10-CM | POA: Insufficient documentation

## 2022-01-13 DIAGNOSIS — W1839XA Other fall on same level, initial encounter: Secondary | ICD-10-CM | POA: Insufficient documentation

## 2022-01-13 DIAGNOSIS — Z7901 Long term (current) use of anticoagulants: Secondary | ICD-10-CM | POA: Insufficient documentation

## 2022-01-13 DIAGNOSIS — Z79899 Other long term (current) drug therapy: Secondary | ICD-10-CM | POA: Insufficient documentation

## 2022-01-13 DIAGNOSIS — R29898 Other symptoms and signs involving the musculoskeletal system: Secondary | ICD-10-CM | POA: Diagnosis not present

## 2022-01-13 DIAGNOSIS — Y92142 Bathroom in prison as the place of occurrence of the external cause: Secondary | ICD-10-CM | POA: Insufficient documentation

## 2022-01-13 DIAGNOSIS — G629 Polyneuropathy, unspecified: Secondary | ICD-10-CM | POA: Diagnosis not present

## 2022-01-13 DIAGNOSIS — Z7984 Long term (current) use of oral hypoglycemic drugs: Secondary | ICD-10-CM | POA: Diagnosis not present

## 2022-01-13 LAB — DIFFERENTIAL
Abs Immature Granulocytes: 0.02 10*3/uL (ref 0.00–0.07)
Basophils Absolute: 0 10*3/uL (ref 0.0–0.1)
Basophils Relative: 1 %
Eosinophils Absolute: 0.2 10*3/uL (ref 0.0–0.5)
Eosinophils Relative: 3 %
Immature Granulocytes: 0 %
Lymphocytes Relative: 36 %
Lymphs Abs: 3.1 10*3/uL (ref 0.7–4.0)
Monocytes Absolute: 0.5 10*3/uL (ref 0.1–1.0)
Monocytes Relative: 6 %
Neutro Abs: 4.6 10*3/uL (ref 1.7–7.7)
Neutrophils Relative %: 54 %

## 2022-01-13 LAB — PROTIME-INR
INR: 1 (ref 0.8–1.2)
Prothrombin Time: 13.5 seconds (ref 11.4–15.2)

## 2022-01-13 LAB — RESP PANEL BY RT-PCR (FLU A&B, COVID) ARPGX2
Influenza A by PCR: NEGATIVE
Influenza B by PCR: NEGATIVE
SARS Coronavirus 2 by RT PCR: NEGATIVE

## 2022-01-13 LAB — CBC
HCT: 42.4 % (ref 39.0–52.0)
Hemoglobin: 14.1 g/dL (ref 13.0–17.0)
MCH: 30.5 pg (ref 26.0–34.0)
MCHC: 33.3 g/dL (ref 30.0–36.0)
MCV: 91.6 fL (ref 80.0–100.0)
Platelets: 391 10*3/uL (ref 150–400)
RBC: 4.63 MIL/uL (ref 4.22–5.81)
RDW: 12.9 % (ref 11.5–15.5)
WBC: 8.5 10*3/uL (ref 4.0–10.5)
nRBC: 0 % (ref 0.0–0.2)

## 2022-01-13 LAB — I-STAT CHEM 8, ED
BUN: 10 mg/dL (ref 6–20)
Calcium, Ion: 1.21 mmol/L (ref 1.15–1.40)
Chloride: 101 mmol/L (ref 98–111)
Creatinine, Ser: 0.8 mg/dL (ref 0.61–1.24)
Glucose, Bld: 100 mg/dL — ABNORMAL HIGH (ref 70–99)
HCT: 42 % (ref 39.0–52.0)
Hemoglobin: 14.3 g/dL (ref 13.0–17.0)
Potassium: 3.7 mmol/L (ref 3.5–5.1)
Sodium: 140 mmol/L (ref 135–145)
TCO2: 28 mmol/L (ref 22–32)

## 2022-01-13 LAB — URINALYSIS, ROUTINE W REFLEX MICROSCOPIC
Bilirubin Urine: NEGATIVE
Glucose, UA: NEGATIVE mg/dL
Hgb urine dipstick: NEGATIVE
Ketones, ur: NEGATIVE mg/dL
Leukocytes,Ua: NEGATIVE
Nitrite: NEGATIVE
Protein, ur: NEGATIVE mg/dL
Specific Gravity, Urine: 1.011 (ref 1.005–1.030)
pH: 5 (ref 5.0–8.0)

## 2022-01-13 LAB — COMPREHENSIVE METABOLIC PANEL
ALT: 19 U/L (ref 0–44)
AST: 19 U/L (ref 15–41)
Albumin: 3.8 g/dL (ref 3.5–5.0)
Alkaline Phosphatase: 61 U/L (ref 38–126)
Anion gap: 9 (ref 5–15)
BUN: 9 mg/dL (ref 6–20)
CO2: 27 mmol/L (ref 22–32)
Calcium: 10 mg/dL (ref 8.9–10.3)
Chloride: 103 mmol/L (ref 98–111)
Creatinine, Ser: 0.83 mg/dL (ref 0.61–1.24)
GFR, Estimated: >60 mL/min (ref >60)
Glucose, Bld: 99 mg/dL (ref 70–99)
Potassium: 3.6 mmol/L (ref 3.5–5.1)
Sodium: 139 mmol/L (ref 135–145)
Total Bilirubin: 0.4 mg/dL (ref 0.3–1.2)
Total Protein: 7.5 g/dL (ref 6.5–8.1)

## 2022-01-13 LAB — RAPID URINE DRUG SCREEN, HOSP PERFORMED
Amphetamines: NOT DETECTED
Barbiturates: NOT DETECTED
Benzodiazepines: NOT DETECTED
Cocaine: NOT DETECTED
Opiates: NOT DETECTED
Tetrahydrocannabinol: NOT DETECTED

## 2022-01-13 LAB — APTT: aPTT: 34 seconds (ref 24–36)

## 2022-01-13 LAB — ETHANOL: Alcohol, Ethyl (B): <10 mg/dL (ref <10)

## 2022-01-13 LAB — CBG MONITORING, ED: Glucose-Capillary: 100 mg/dL — ABNORMAL HIGH (ref 70–99)

## 2022-01-13 IMAGING — DX DG KNEE 1-2V PORT*R*
4 series · 4 of 4 positions shown · non-contrast
Comparison: None.

CLINICAL DATA: MRI clearance.  Code stroke.

EXAM:
PORTABLE RIGHT KNEE - 1-2 VIEW

[knee ap]
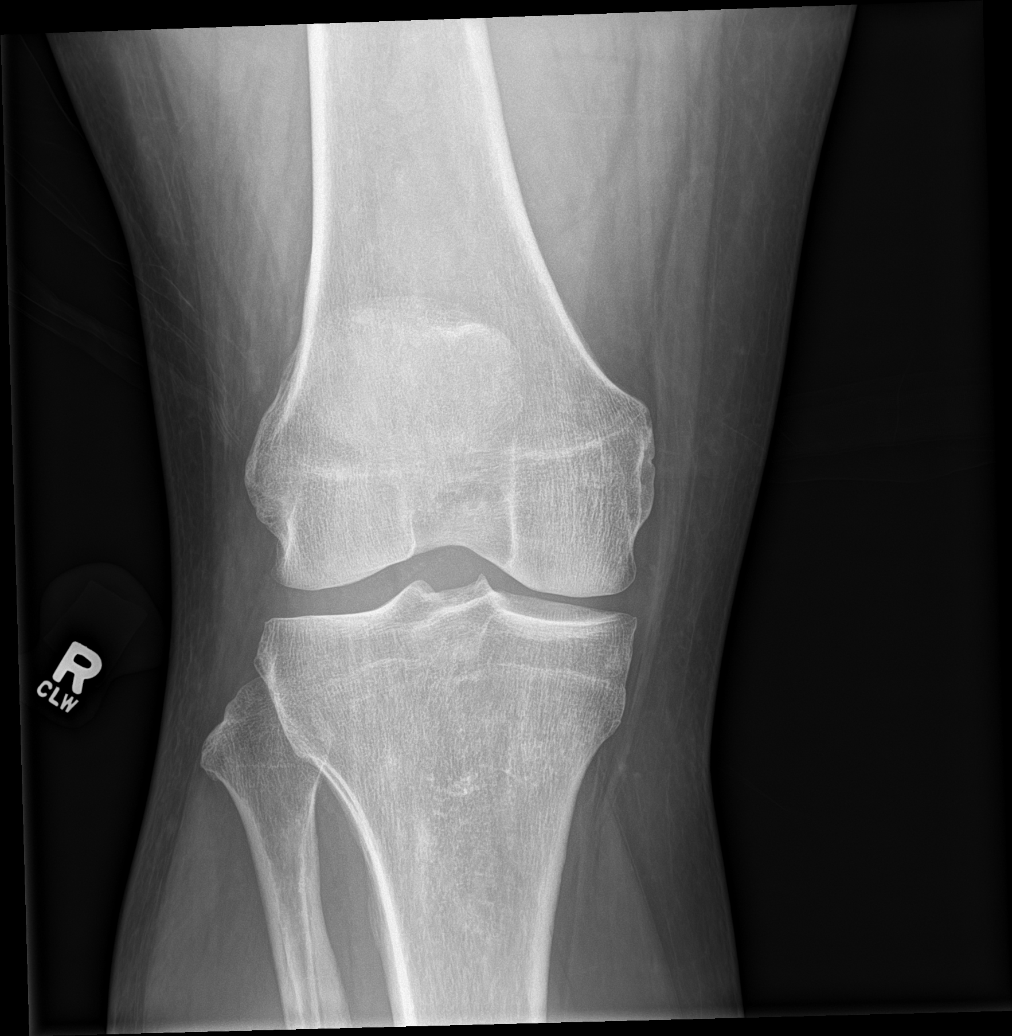

[knee obl (1 of 2)]
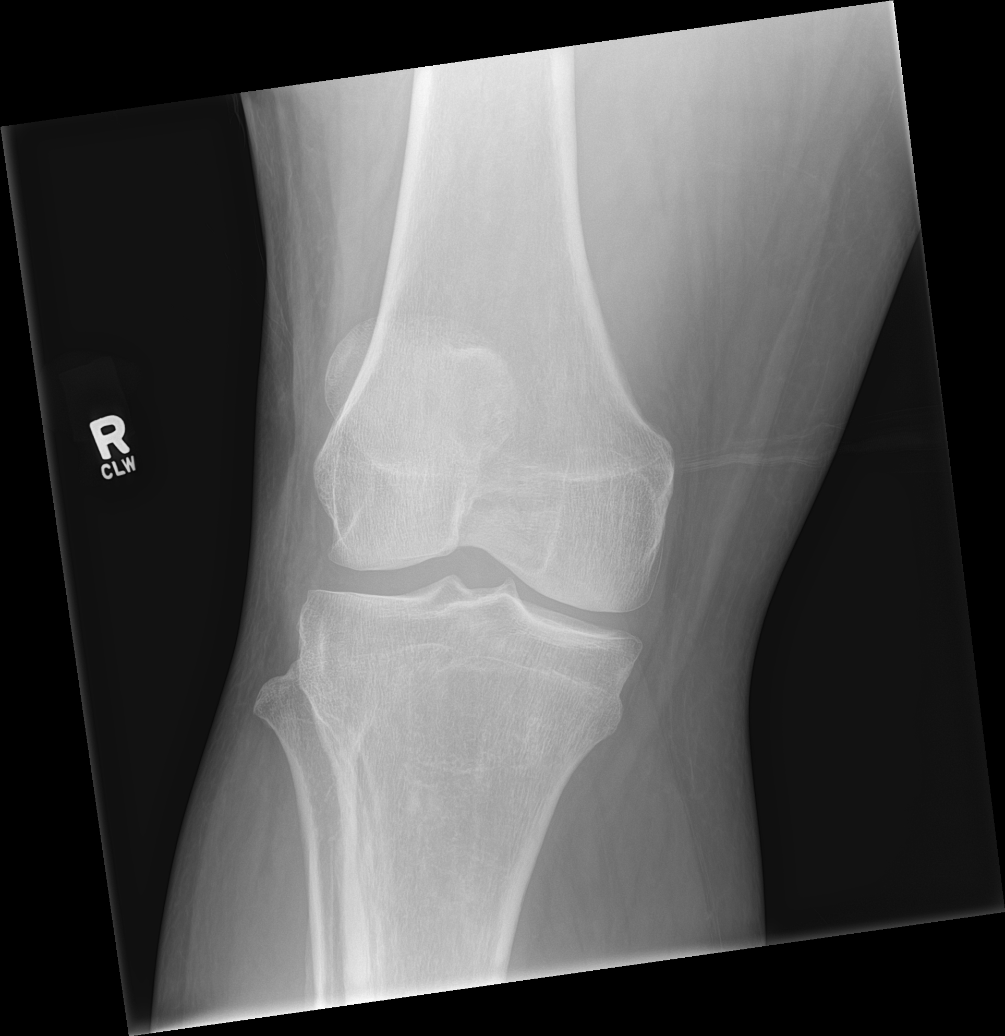

[knee obl (2 of 2)]
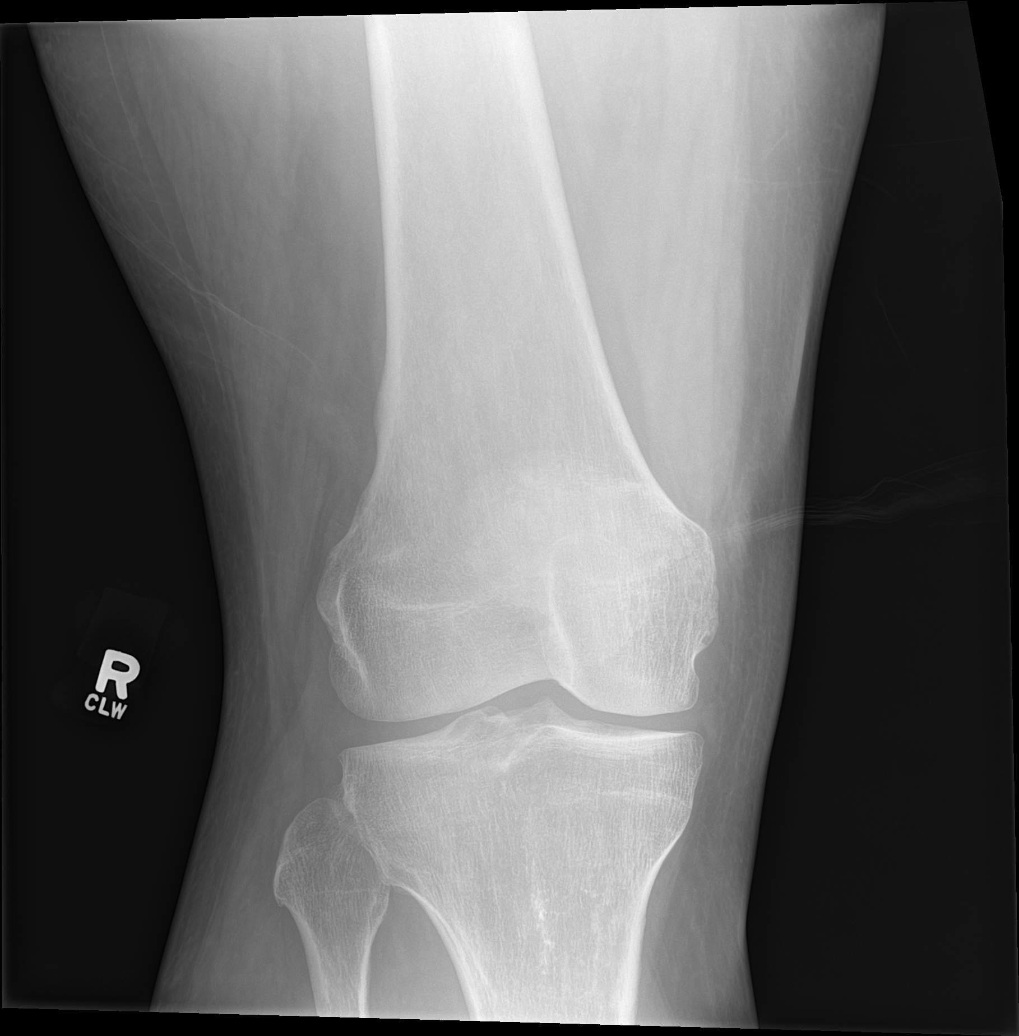

[patella ap]
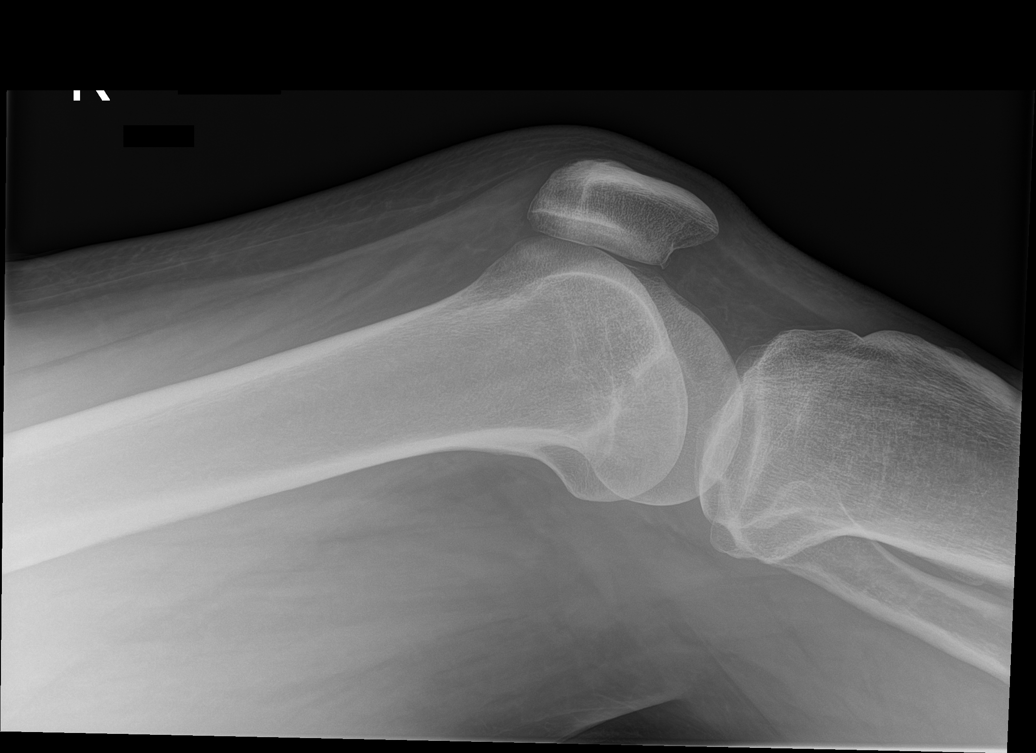

[4 of 4 positions shown; findings below may reference images not displayed]

FINDINGS: Normal bone mineralization. Minimal inferior patellar degenerative
osteophytosis. No joint effusion. No acute fracture or dislocation.
Joint spaces are preserved. No metal is identified.
IMPRESSION: :
IMPRESSION: 1. No significant osteoarthritis.
2. No metal is identified.

## 2022-01-13 IMAGING — MR MR HEAD W/O CM
12 of 13 series · 44 of 48 positions shown · IV contrast (gadavist)
Comparison: None

CLINICAL DATA: Multiple falls, right leg numbness



[Series 5: DWI · axial · 3.0mm · 0.88mm/px · z∈[-99,+53]mm · 8 of 104 slices shown (1 of 4)]
[im 1/104]
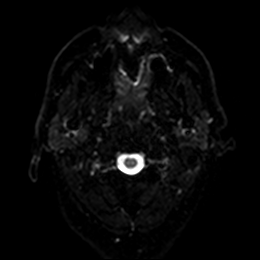
[im 15/104]
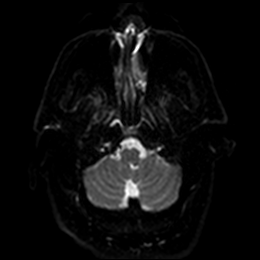
[im 30/104]
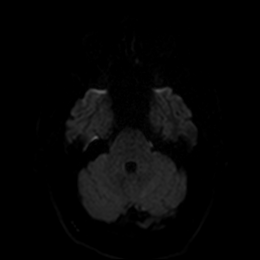
[im 45/104]
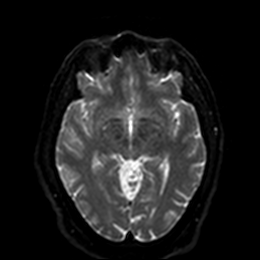
[im 59/104]
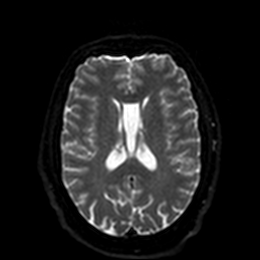
[im 74/104]
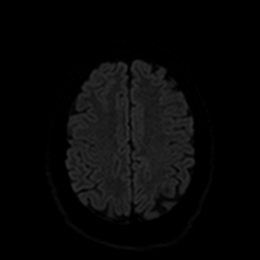
[im 89/104]
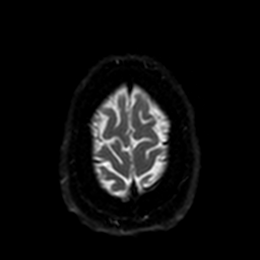
[im 104/104]
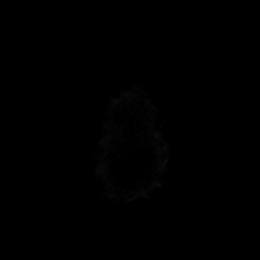

[Series 6: DWI · axial · 3.0mm · 0.88mm/px · z∈[-99,+53]mm · 4 of 52 slices shown (2 of 4)]
[im 1/52]
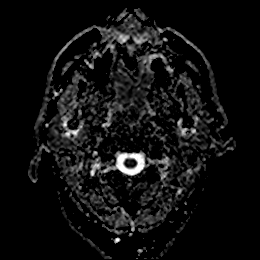
[im 18/52]
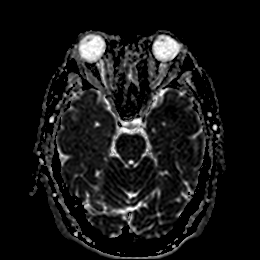
[im 35/52]
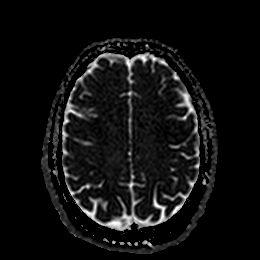
[im 52/52]
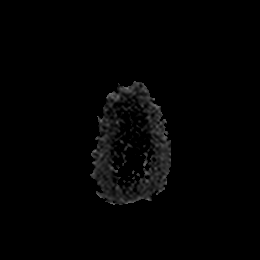

[Series 7: DWI · coronal · 4.0mm · 0.88mm/px · 5 of 72 slices shown (3 of 4)]
[im 1/72]
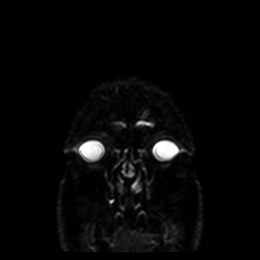
[im 18/72]
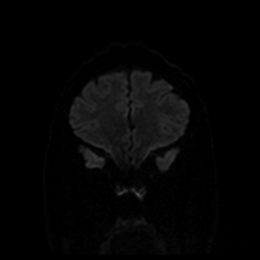
[im 36/72]
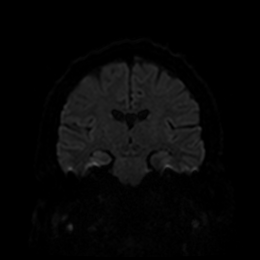
[im 54/72]
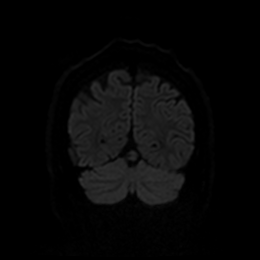
[im 72/72]
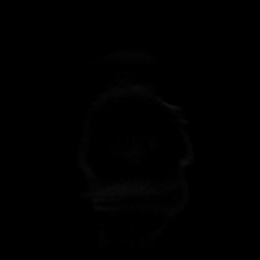

[Series 8: DWI · coronal · 4.0mm · 0.88mm/px · 3 of 36 slices shown (4 of 4)]
[im 1/36]
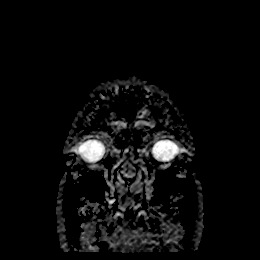
[im 18/36]
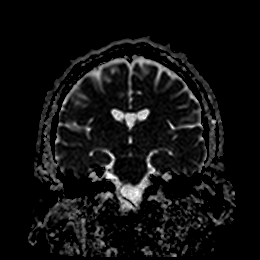
[im 36/36]
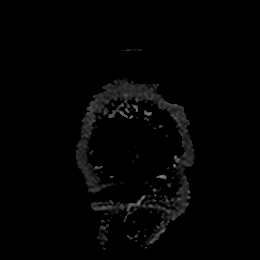

[Series 9: T1 · sagittal · 5.0mm · 0.75mm/px · 2 of 24 slices shown]
[im 1/24]
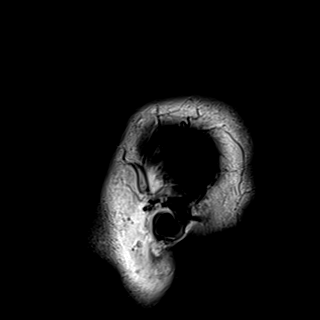
[im 24/24]
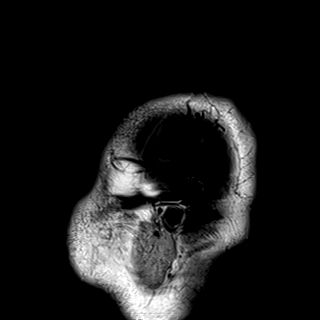

[Series 10: T2 · axial · 5.0mm · 0.72mm/px · z∈[-98,+51]mm · 2 of 26 slices shown (1 of 2)]
[im 1/26]
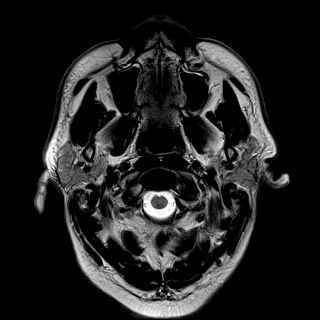
[im 26/26]
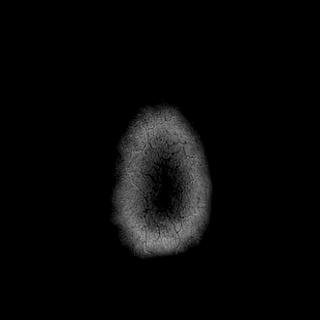

[Series 11: FLAIR · axial · 5.0mm · 0.45mm/px · z∈[-100,+50]mm · 2 of 26 slices shown]
[im 1/26]
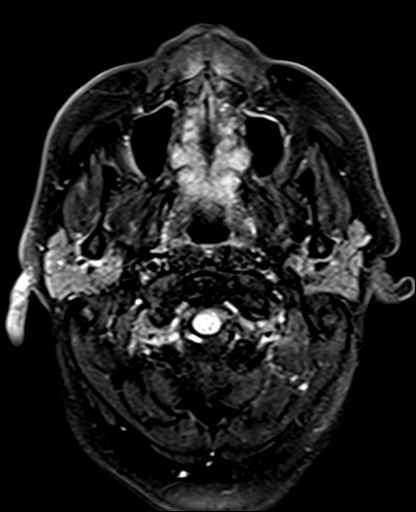
[im 26/26]
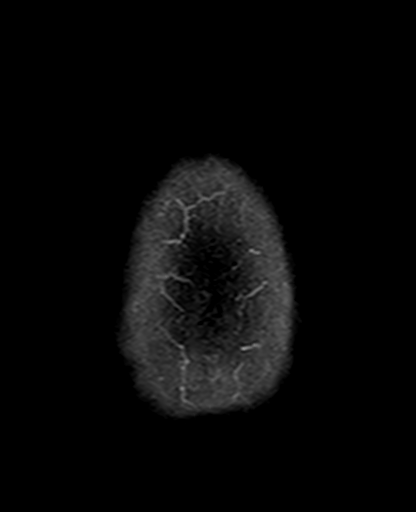

[Series 12: mag_images · axial · 3.0mm · 0.90mm/px · z∈[-107,+57]mm · 4 of 56 slices shown]
[im 1/56]
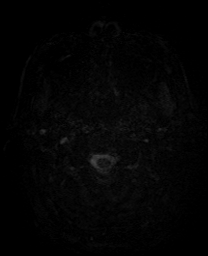
[im 19/56]
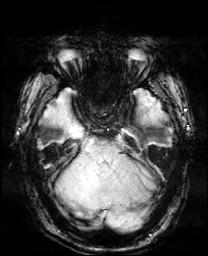
[im 37/56]
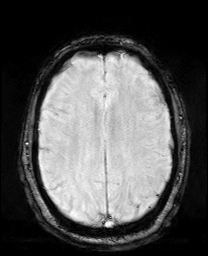
[im 56/56]
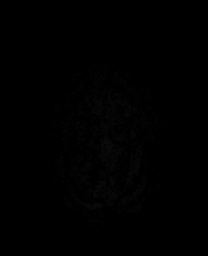

[Series 13: pha_images · axial · 3.0mm · 0.90mm/px · z∈[-104,+54]mm · 4 of 54 slices shown]
[im 1/54]
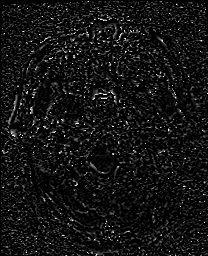
[im 18/54]
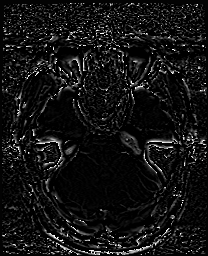
[im 36/54]
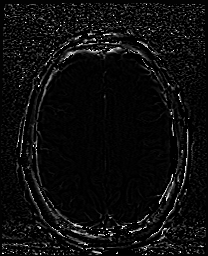
[im 54/54]
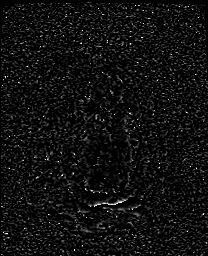

[Series 14: swi_images · axial · 3.0mm · 0.90mm/px · z∈[-107,+57]mm · 4 of 56 slices shown]
[im 1/56]
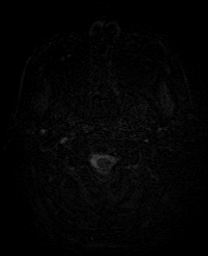
[im 19/56]
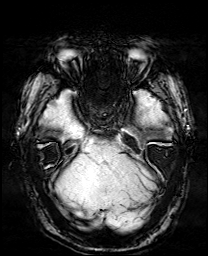
[im 37/56]
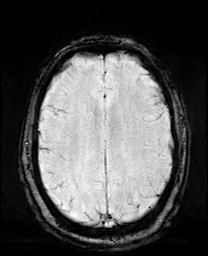
[im 56/56]
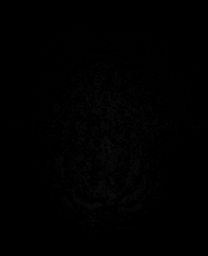

[Series 15: mip_images(sw) · axial · 24.0mm · 0.90mm/px · z∈[-97,+47]mm · 4 of 49 slices shown]
[im 1/49]
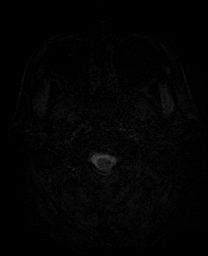
[im 17/49]
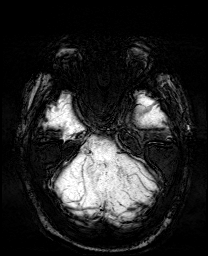
[im 33/49]
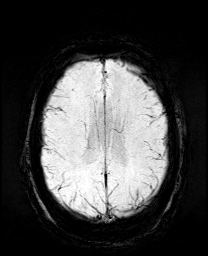
[im 49/49]
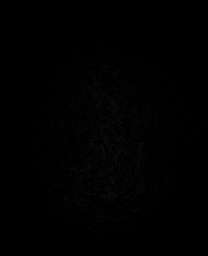

[Series 17: T2 · coronal · 5.0mm · 0.34mm/px · 2 of 31 slices shown (2 of 2)]
[im 1/31]
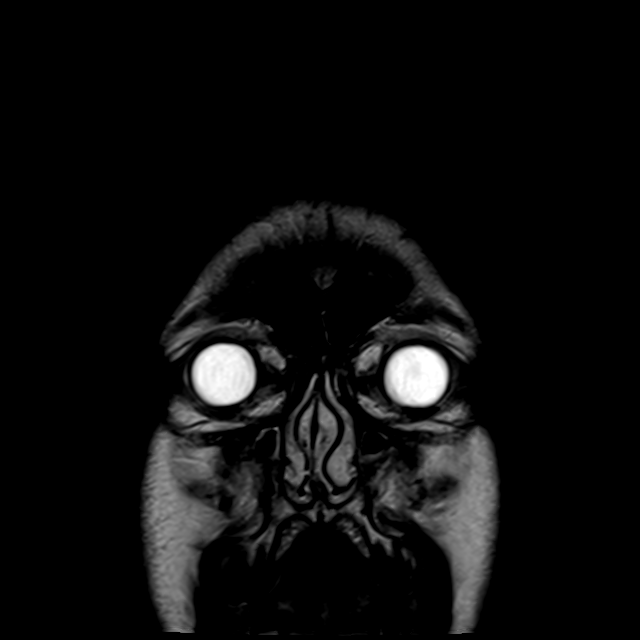
[im 31/31]
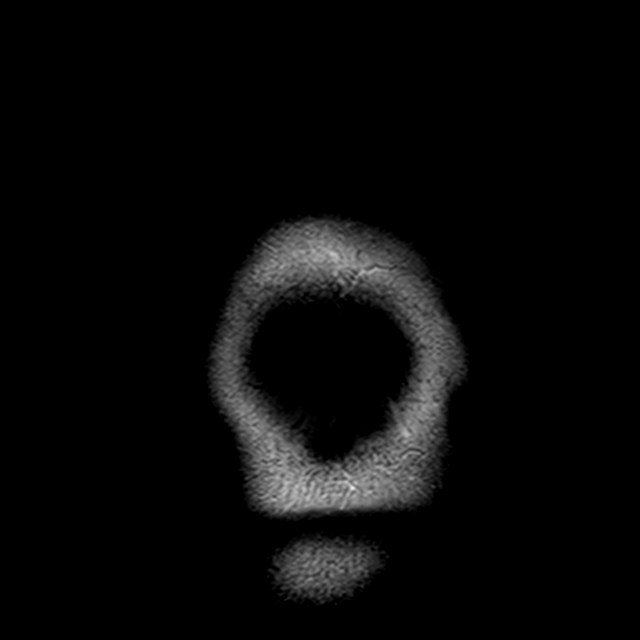

[44 of 48 positions shown; findings below may reference images not displayed]

FINDINGS: MRI HEAD

Brain: There is no acute infarction or intracranial hemorrhage.
There is no intracranial mass, mass effect, or edema. There is no
hydrocephalus or extra-axial fluid collection. Ventricles and sulci
are within normal limits in size configuration. Punctate focus of T2
hyperintensity in the right frontal white matter likely reflects
nonspecific gliosis/demyelination of doubtful significance.

Vascular: Major vessel flow voids at the skull base are preserved.

Skull and upper cervical spine: Normal marrow signal is preserved.

Sinuses/Orbits: Paranasal sinuses are aerated. Orbits are
unremarkable.

Other: Sella is unremarkable.  Mastoid air cells are clear.

MRA HEAD

Intracranial internal carotid arteries are patent. Middle and
anterior cerebral arteries are patent. Incidental fenestration of
right A1 ACA. Intracranial vertebral arteries, basilar artery,
posterior cerebral arteries are patent. Bilateral posterior
communicating arteries are present. There is no significant stenosis
or aneurysm.

MRA NECK

Included arch is unremarkable. Great vessel origins are patent.
Common, internal, and external carotid arteries are patent.
Codominant extracranial vertebral arteries are patent. There is no
hemodynamically significant stenosis or evidence of dissection.
IMPRESSION: No acute infarction, hemorrhage, or mass.

Unremarkable vascular imaging.

## 2022-01-13 IMAGING — MR MR MRA HEAD W/O CM
1 series · 19 of 48 positions shown · IV contrast (gadavist)
Comparison: None

CLINICAL DATA: Multiple falls, right leg numbness



[Series 5: 3d cow · axial · 0.5mm · 0.41mm/px · z∈[-98,-9]mm · 19 of 188 slices shown]
[im 1/188]
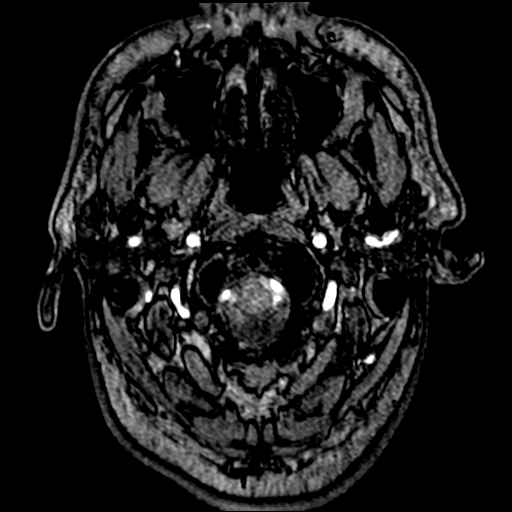
[im 4/188]
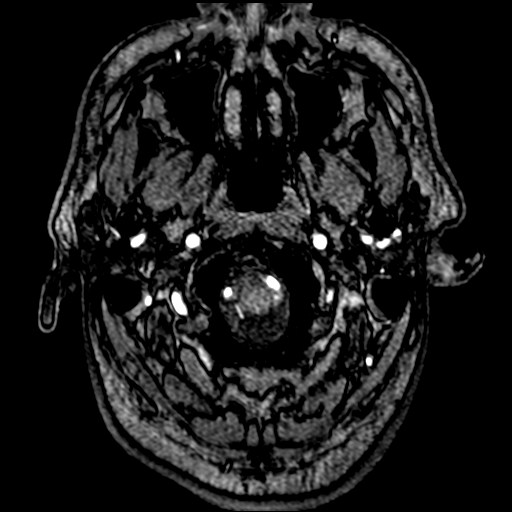
[im 8/188]
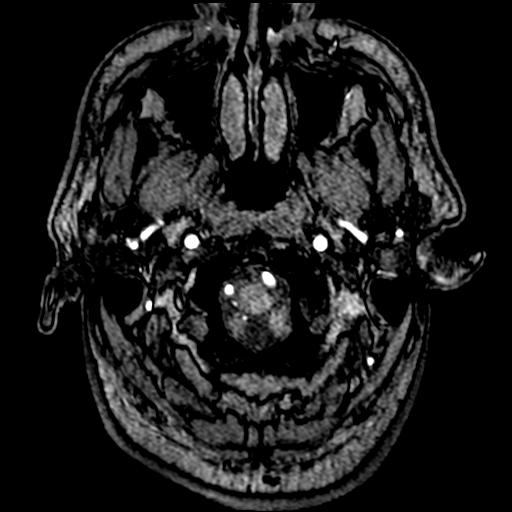
[im 12/188]
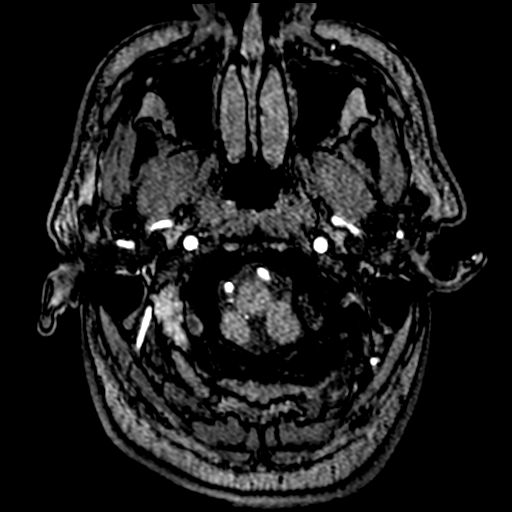
[im 16/188]
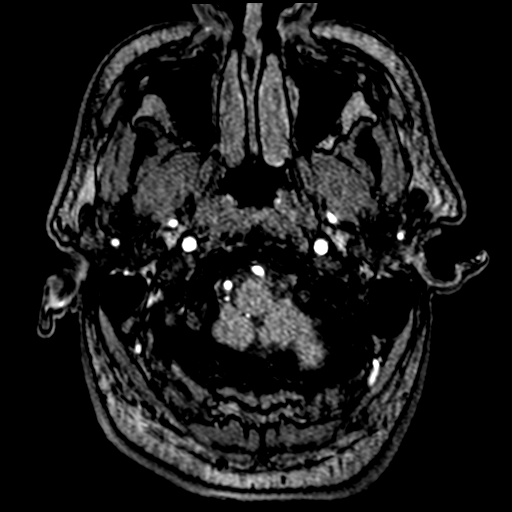
[im 20/188]
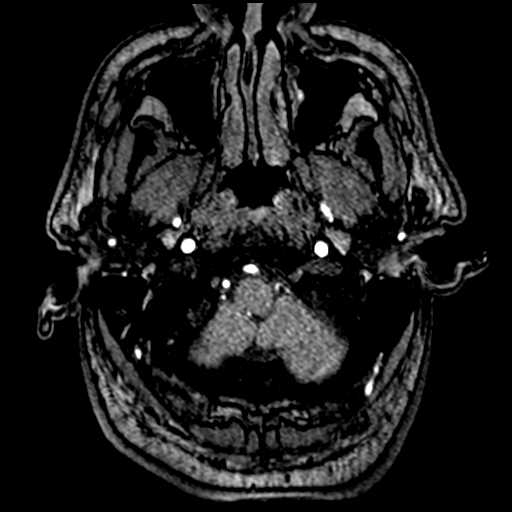
[im 24/188]
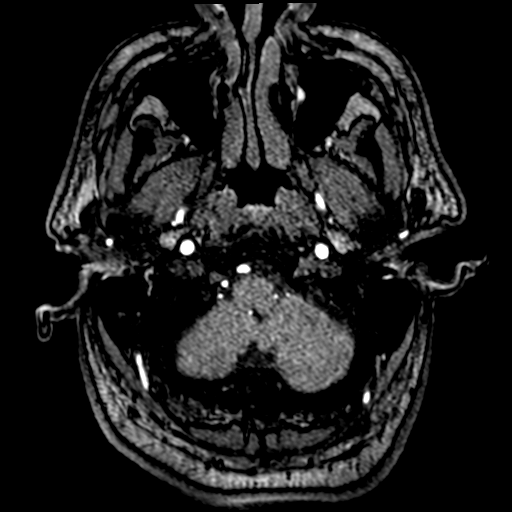
[im 28/188]
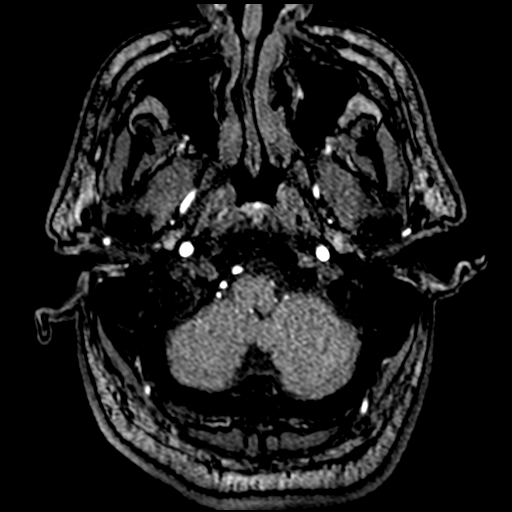
[im 32/188]
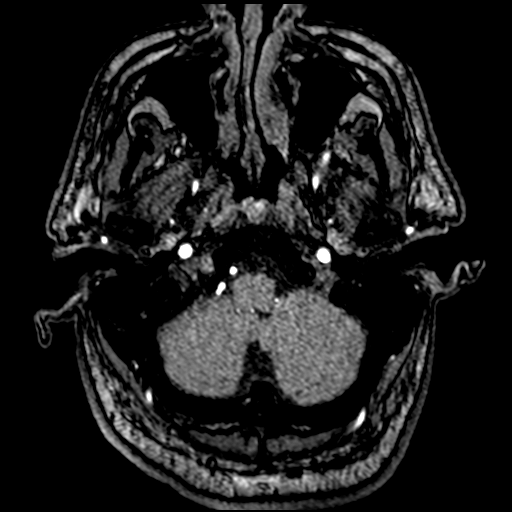
[im 36/188]
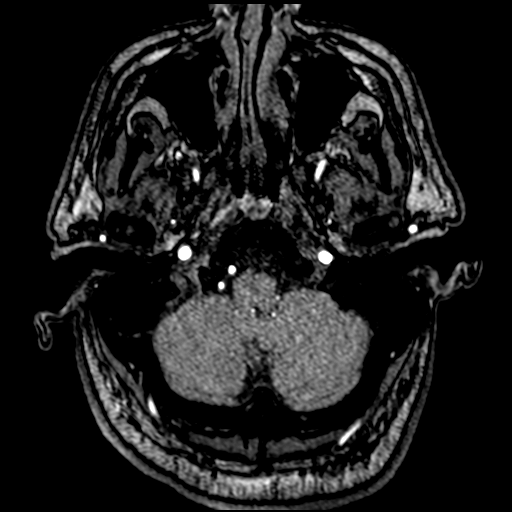
[im 40/188]
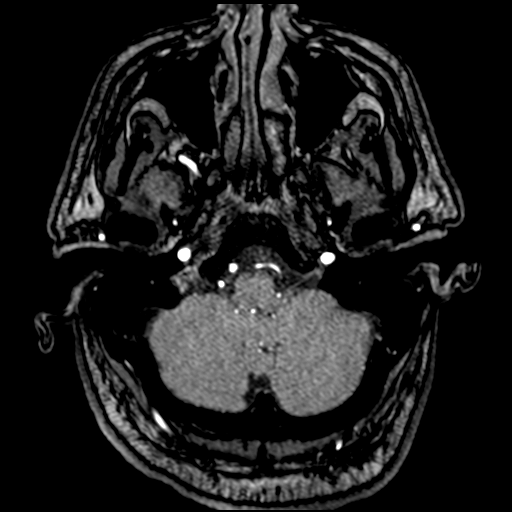
[im 60/188]
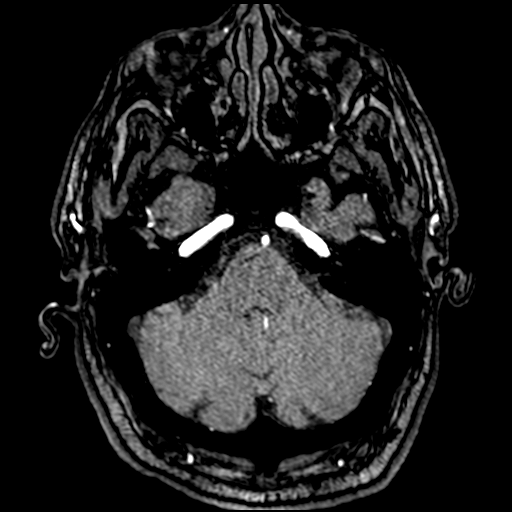
[im 84/188]
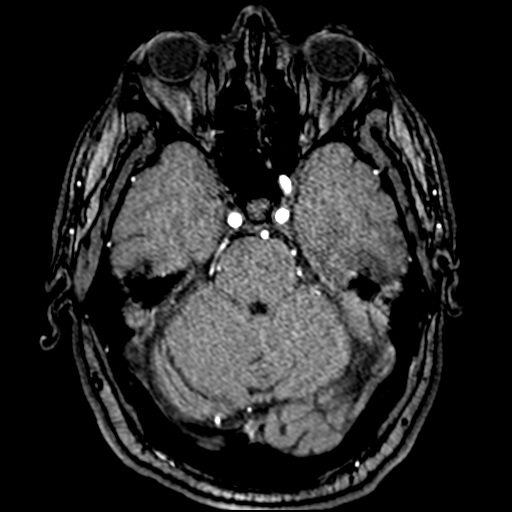
[im 96/188]
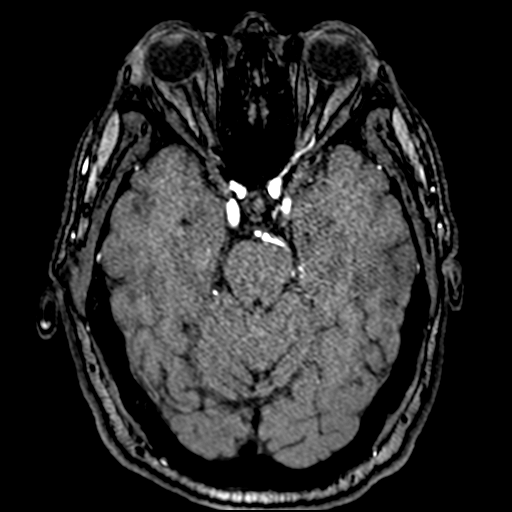
[im 108/188]
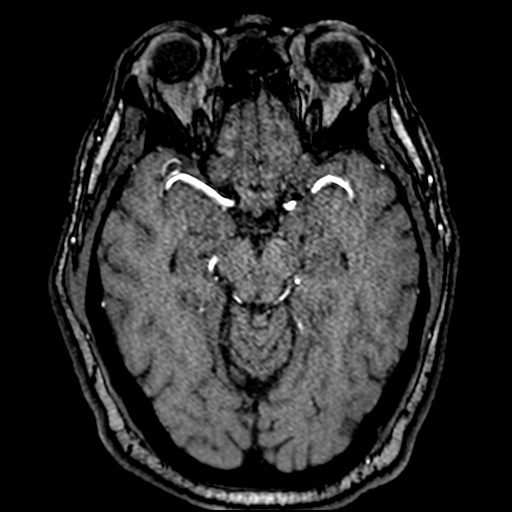
[im 132/188]
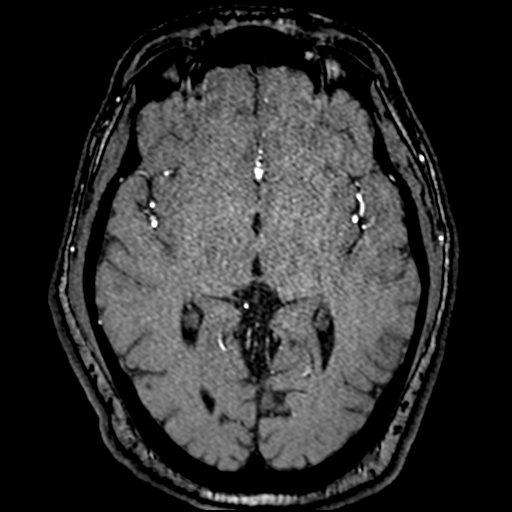
[im 156/188]
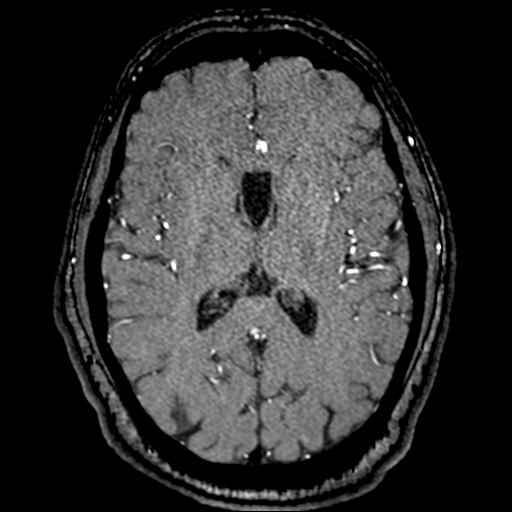
[im 160/188]
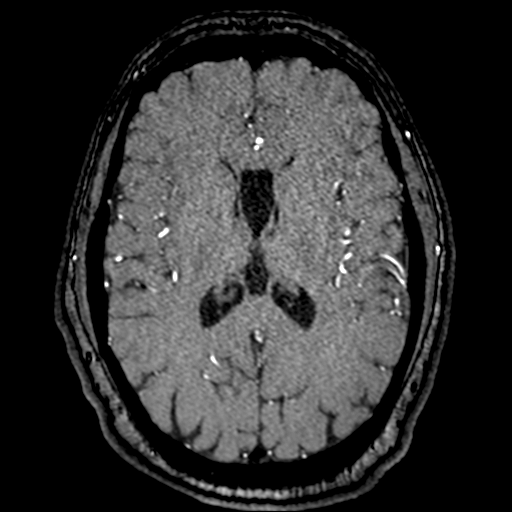
[im 180/188]
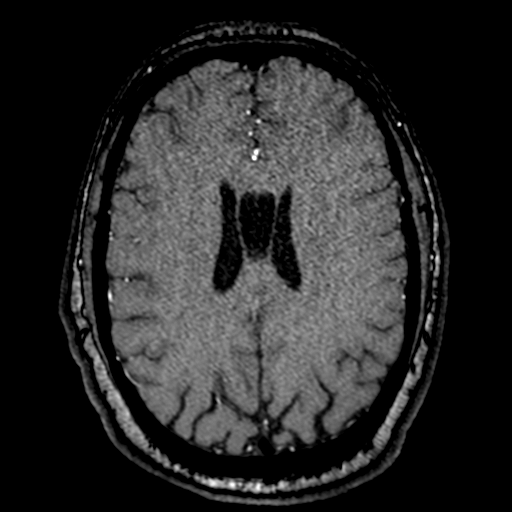

[19 of 48 positions shown; findings below may reference images not displayed]

FINDINGS: MRI HEAD

Brain: There is no acute infarction or intracranial hemorrhage.
There is no intracranial mass, mass effect, or edema. There is no
hydrocephalus or extra-axial fluid collection. Ventricles and sulci
are within normal limits in size configuration. Punctate focus of T2
hyperintensity in the right frontal white matter likely reflects
nonspecific gliosis/demyelination of doubtful significance.

Vascular: Major vessel flow voids at the skull base are preserved.

Skull and upper cervical spine: Normal marrow signal is preserved.

Sinuses/Orbits: Paranasal sinuses are aerated. Orbits are
unremarkable.

Other: Sella is unremarkable.  Mastoid air cells are clear.

MRA HEAD

Intracranial internal carotid arteries are patent. Middle and
anterior cerebral arteries are patent. Incidental fenestration of
right A1 ACA. Intracranial vertebral arteries, basilar artery,
posterior cerebral arteries are patent. Bilateral posterior
communicating arteries are present. There is no significant stenosis
or aneurysm.

MRA NECK

Included arch is unremarkable. Great vessel origins are patent.
Common, internal, and external carotid arteries are patent.
Codominant extracranial vertebral arteries are patent. There is no
hemodynamically significant stenosis or evidence of dissection.
IMPRESSION: No acute infarction, hemorrhage, or mass.

Unremarkable vascular imaging.

## 2022-01-13 IMAGING — MR MR MRA NECK WO/W CM
7 of 8 series · 43 of 48 positions shown · IV contrast (Contrast agent)
Comparison: None

CLINICAL DATA: Multiple falls, right leg numbness



[Series 10: tof_fl3d_tra_iso · axial · 0.6mm · 0.52mm/px · z∈[-174,-95]mm · 8 of 133 slices shown]
[im 1/133]
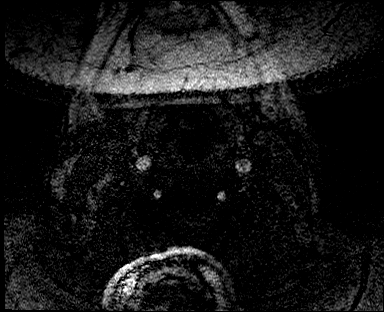
[im 19/133]
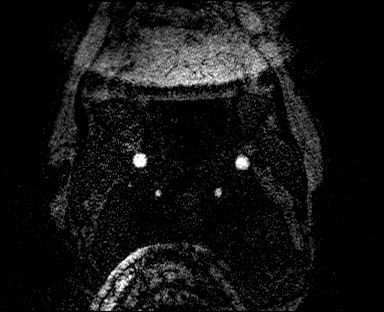
[im 38/133]
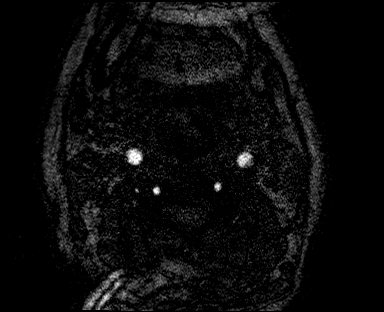
[im 57/133]
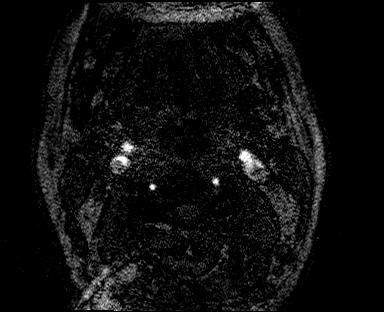
[im 76/133]
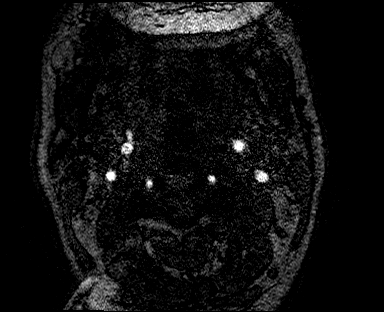
[im 95/133]
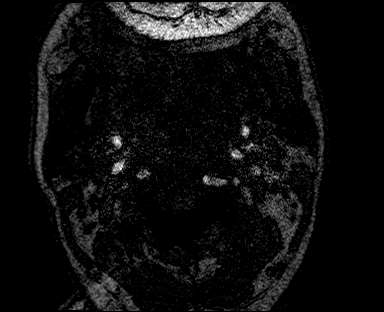
[im 114/133]
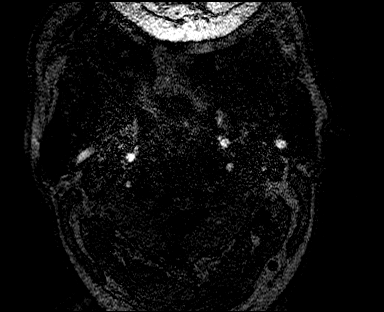
[im 133/133]
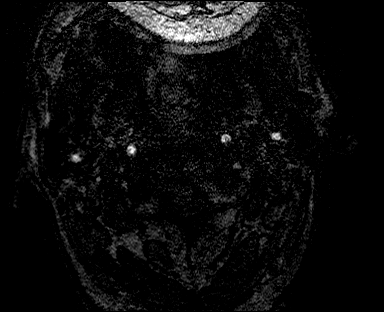

[Series 13: angio_fl3d_cor_pre_ttc=3.0s · coronal · 0.9mm · 0.85mm/px · 5 of 80 slices shown]
[im 1/80]
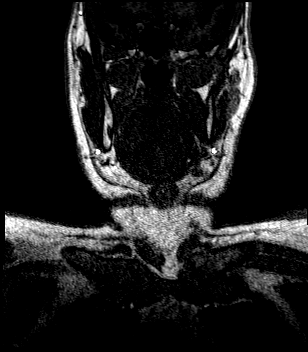
[im 20/80]
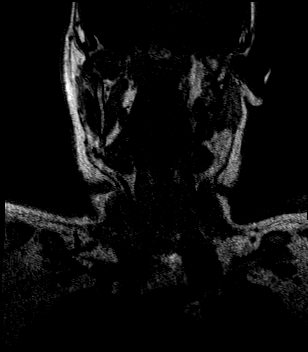
[im 40/80]
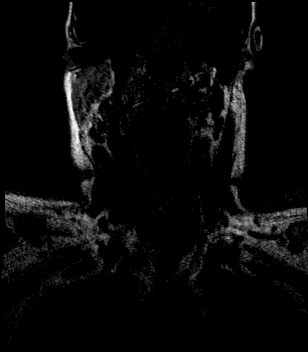
[im 60/80]
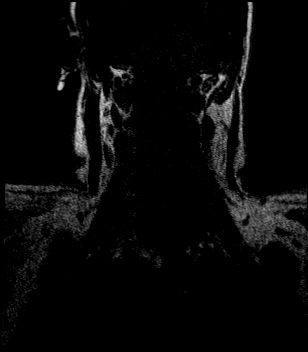
[im 80/80]
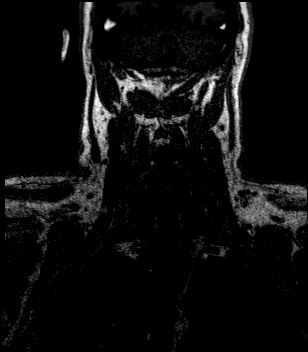

[Series 15: angio_fl3d_cor_post_ttc=3.0s · coronal · 0.9mm · 0.85mm/px · 6 of 79 slices shown (1 of 2)]
[im 1/79]
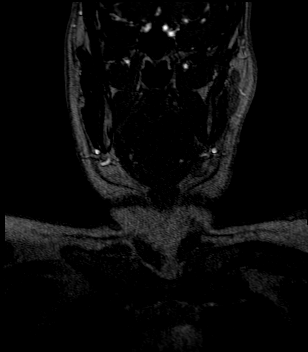
[im 16/79]
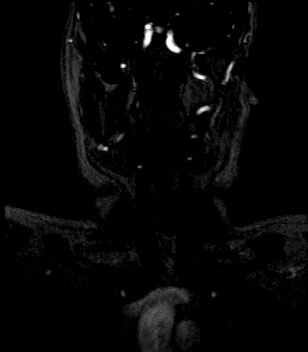
[im 32/79]
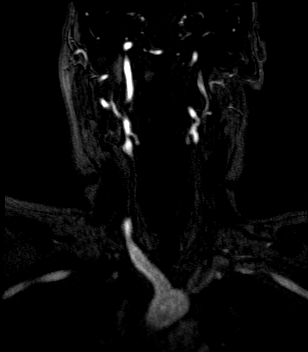
[im 47/79]
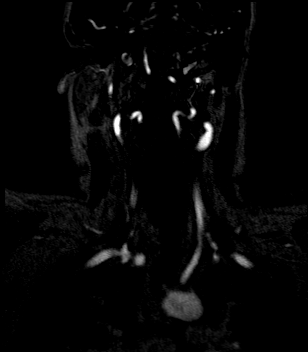
[im 63/79]
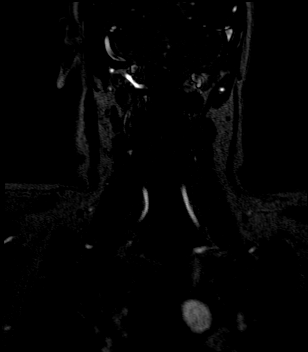
[im 79/79]
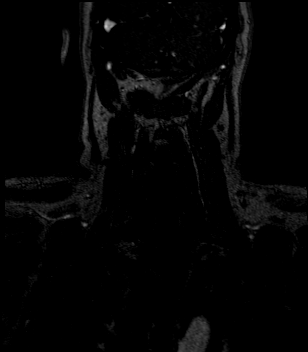

[Series 16: angio_fl3d_cor_post_ttc=3.0s_moco-adv · coronal · 0.9mm · 0.85mm/px · 6 of 80 slices shown (1 of 2)]
[im 1/80]
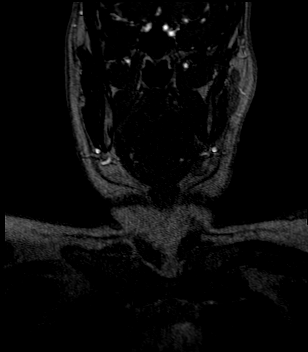
[im 16/80]
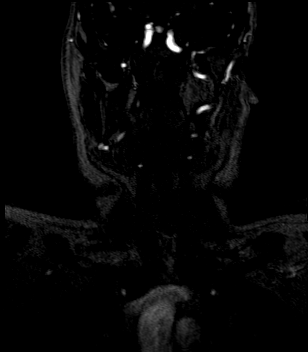
[im 32/80]
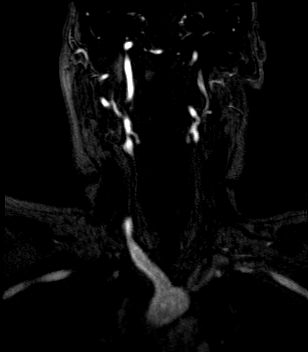
[im 48/80]
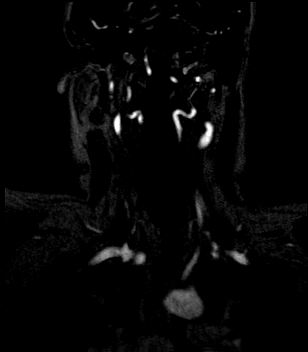
[im 64/80]
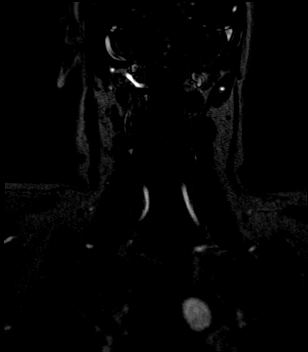
[im 80/80]
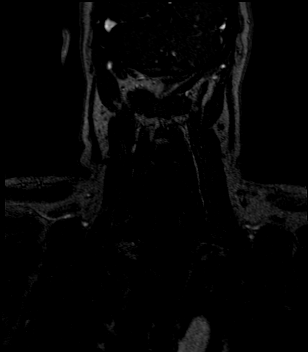

[Series 17: angio_fl3d_cor_post_ttc=3.0s_moco-adv_sub · coronal · 0.9mm · 0.85mm/px · 6 of 79 slices shown]
[im 1/79]
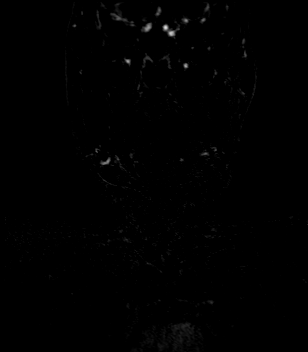
[im 16/79]
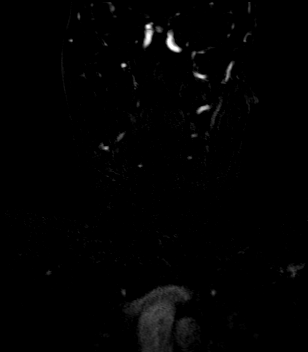
[im 32/79]
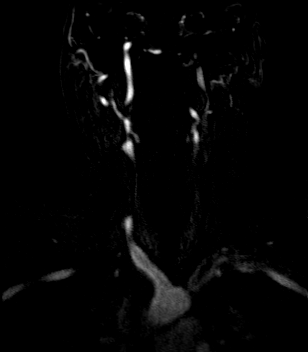
[im 47/79]
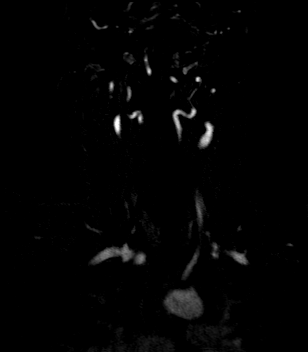
[im 63/79]
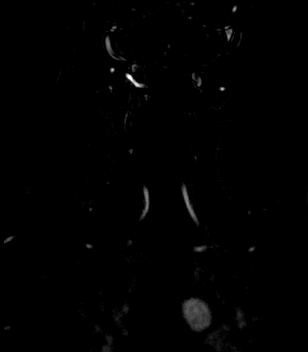
[im 79/79]
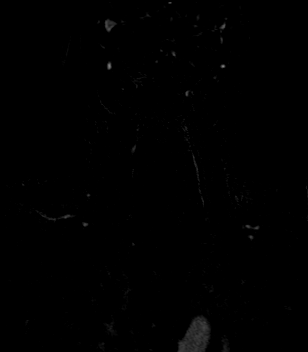

[Series 19: angio_fl3d_cor_post_ttc=3.0s · coronal · 0.9mm · 0.85mm/px · 6 of 80 slices shown (2 of 2)]
[im 1/80]
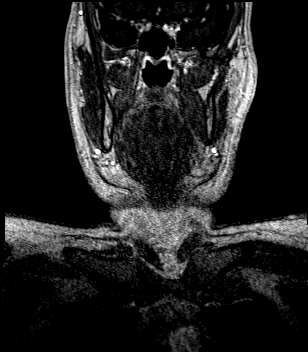
[im 16/80]
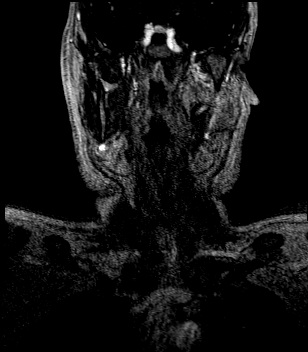
[im 32/80]
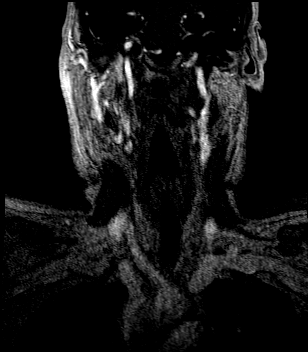
[im 48/80]
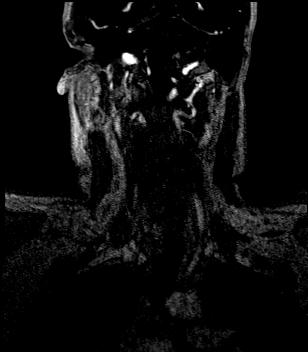
[im 64/80]
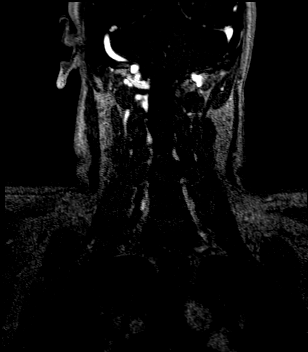
[im 80/80]
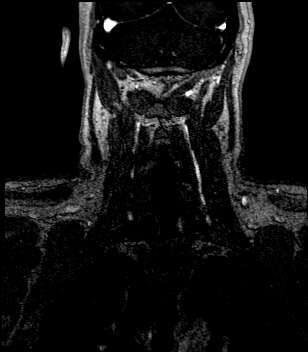

[Series 20: angio_fl3d_cor_post_ttc=3.0s_moco-adv · coronal · 0.9mm · 0.85mm/px · 6 of 80 slices shown (2 of 2)]
[im 1/80]
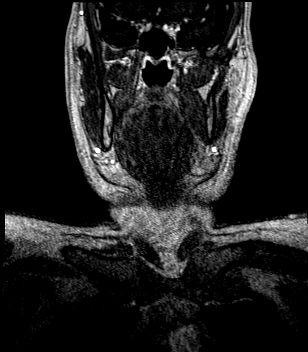
[im 16/80]
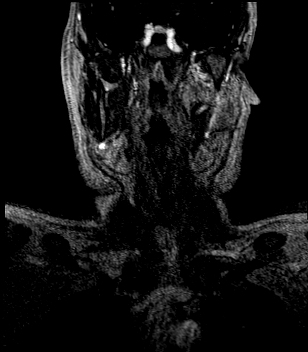
[im 32/80]
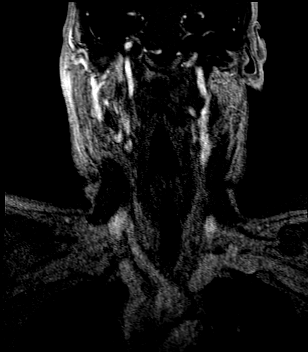
[im 48/80]
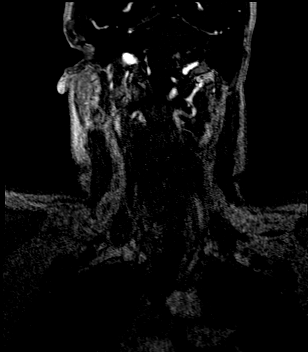
[im 64/80]
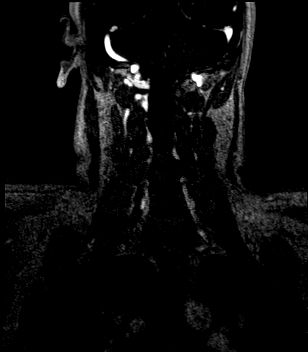
[im 80/80]
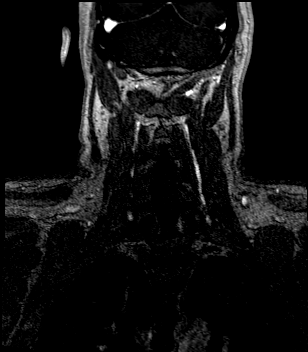

[43 of 48 positions shown; findings below may reference images not displayed]

FINDINGS: MRI HEAD

Brain: There is no acute infarction or intracranial hemorrhage.
There is no intracranial mass, mass effect, or edema. There is no
hydrocephalus or extra-axial fluid collection. Ventricles and sulci
are within normal limits in size configuration. Punctate focus of T2
hyperintensity in the right frontal white matter likely reflects
nonspecific gliosis/demyelination of doubtful significance.

Vascular: Major vessel flow voids at the skull base are preserved.

Skull and upper cervical spine: Normal marrow signal is preserved.

Sinuses/Orbits: Paranasal sinuses are aerated. Orbits are
unremarkable.

Other: Sella is unremarkable.  Mastoid air cells are clear.

MRA HEAD

Intracranial internal carotid arteries are patent. Middle and
anterior cerebral arteries are patent. Incidental fenestration of
right A1 ACA. Intracranial vertebral arteries, basilar artery,
posterior cerebral arteries are patent. Bilateral posterior
communicating arteries are present. There is no significant stenosis
or aneurysm.

MRA NECK

Included arch is unremarkable. Great vessel origins are patent.
Common, internal, and external carotid arteries are patent.
Codominant extracranial vertebral arteries are patent. There is no
hemodynamically significant stenosis or evidence of dissection.
IMPRESSION: No acute infarction, hemorrhage, or mass.

Unremarkable vascular imaging.

## 2022-01-13 IMAGING — CT CT HEAD CODE STROKE
3 series · 15 of 47 positions shown, 18 images · non-contrast
Comparison: [DATE]

CLINICAL DATA: Code stroke.  Neuro deficit, acute, stroke suspected



[Series 3: head 5.0 h30s · axial · 0.46mm/px · z∈[-194,-54]mm · 9 of 34 slices shown, 12 images]
[im 3/34  brain]
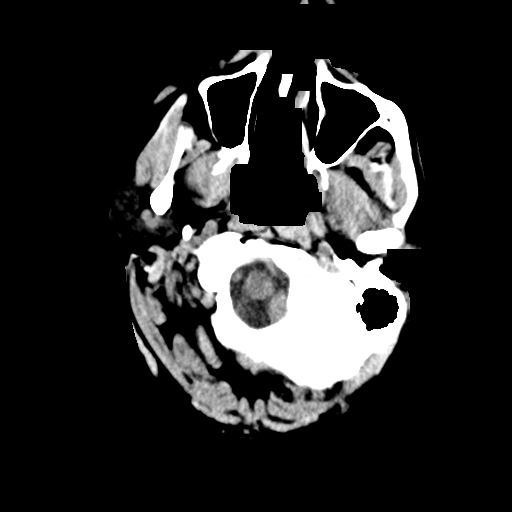
[im 3/34  bone]
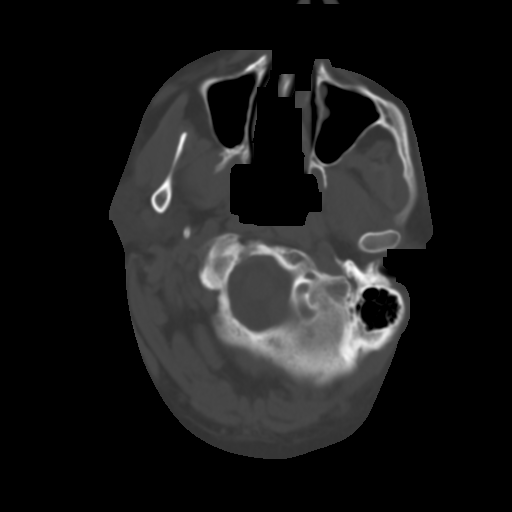
[im 6/34  brain]
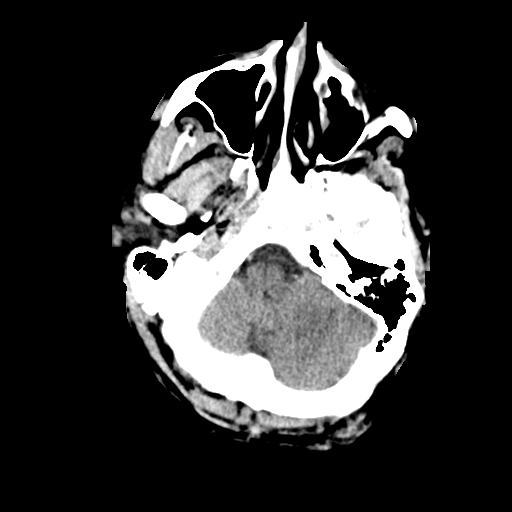
[im 10/34  brain]
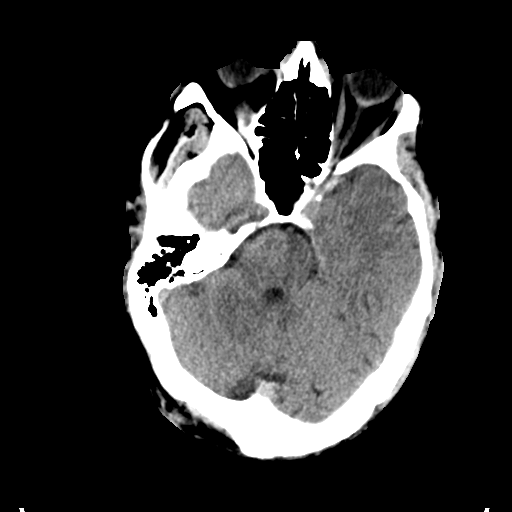
[im 13/34  brain]
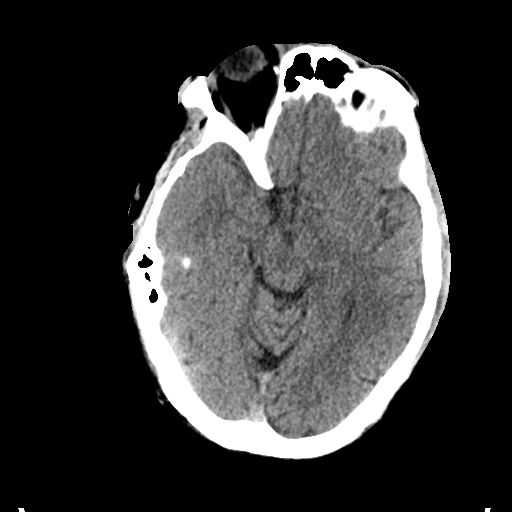
[im 18/34  brain]
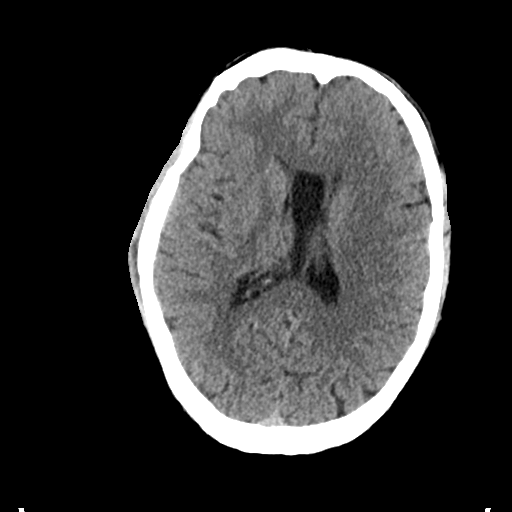
[im 18/34  bone]
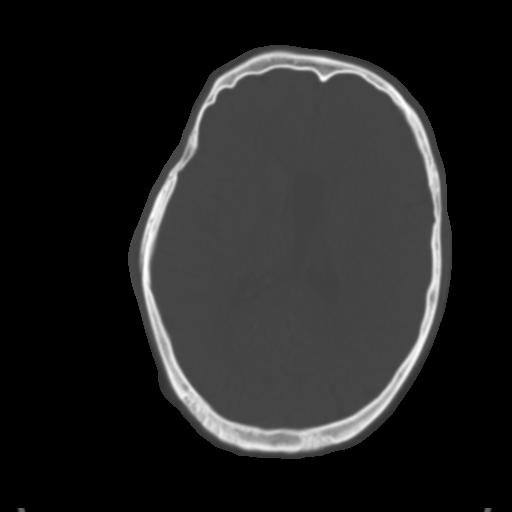
[im 21/34  brain]
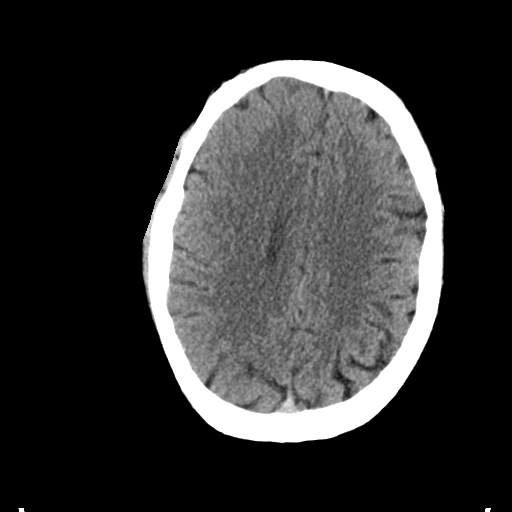
[im 24/34  brain]
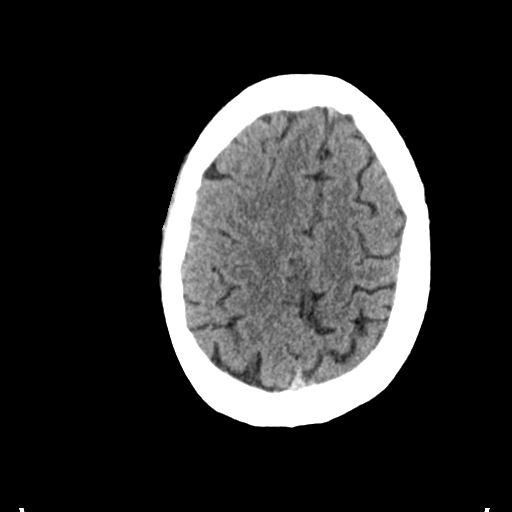
[im 28/34  brain]
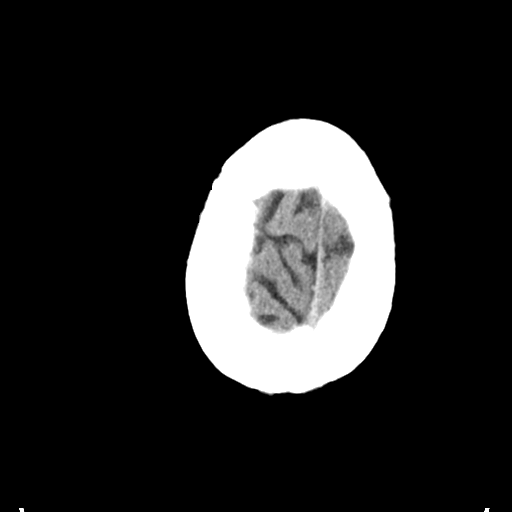
[im 31/34  brain]
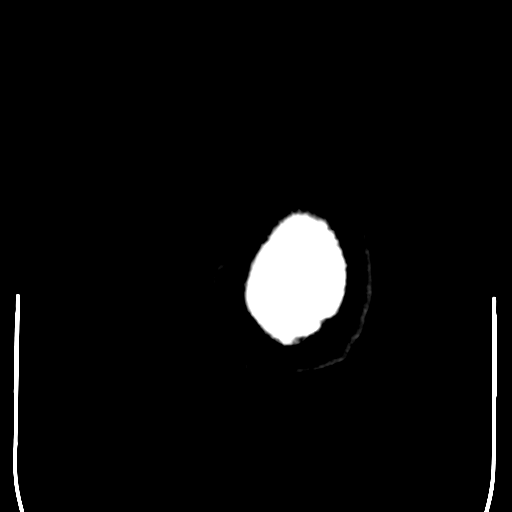
[im 31/34  bone]
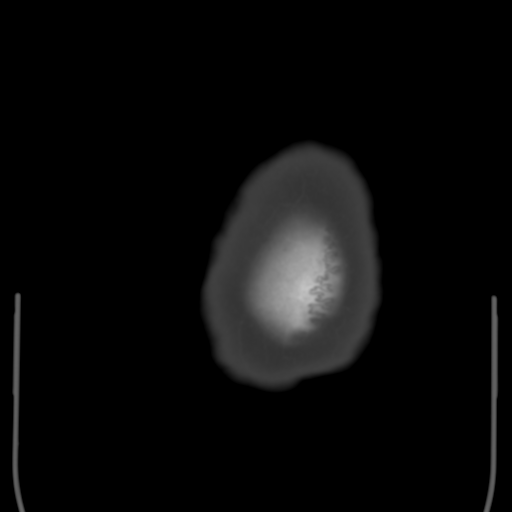

[Series 5: head 3.0 mpr cor · coronal · 0.35mm/px · 3 of 77 slices shown]
[im 26/77  brain]
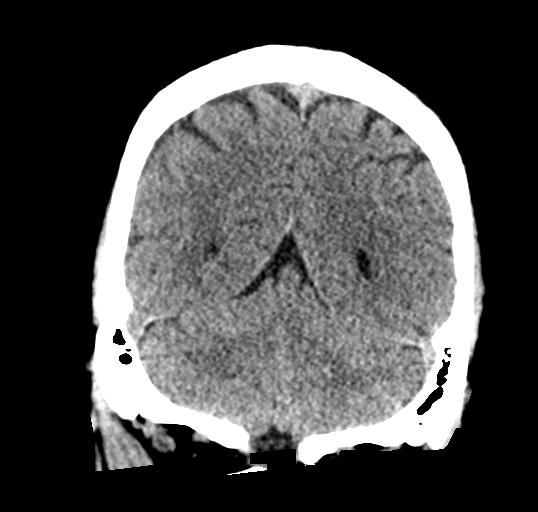
[im 34/77  brain]
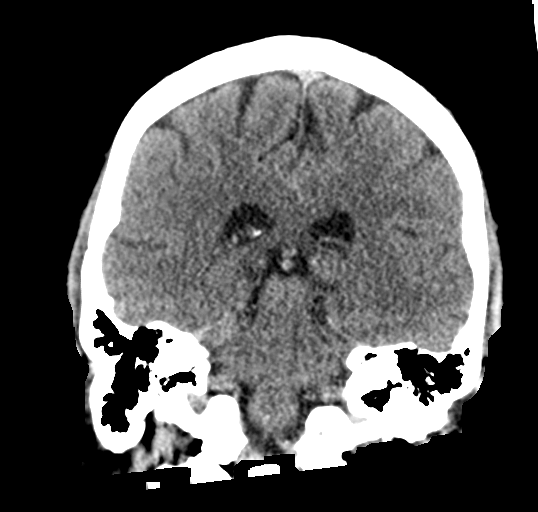
[im 43/77  brain]
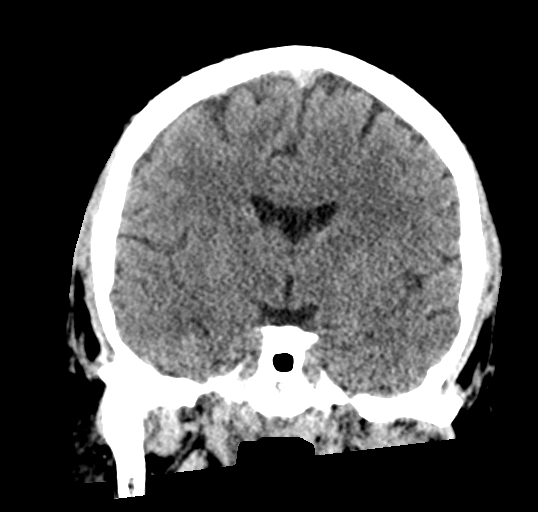

[Series 6: head 3.0 mpr sag · sagittal · 0.33mm/px · 3 of 67 slices shown]
[im 27/67  brain]
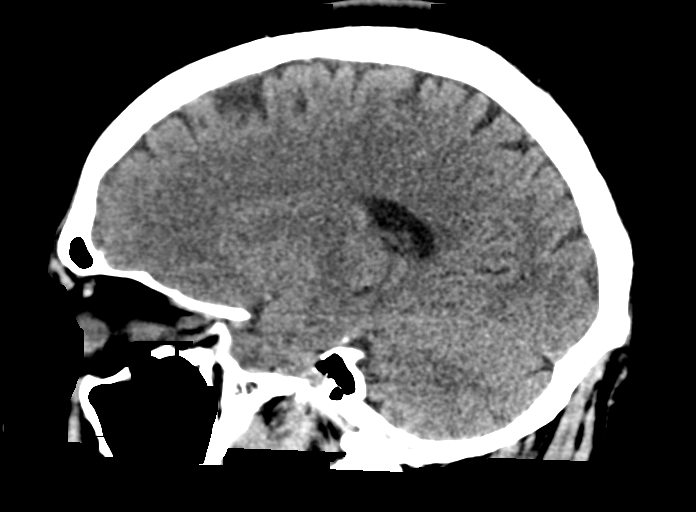
[im 34/67  brain]
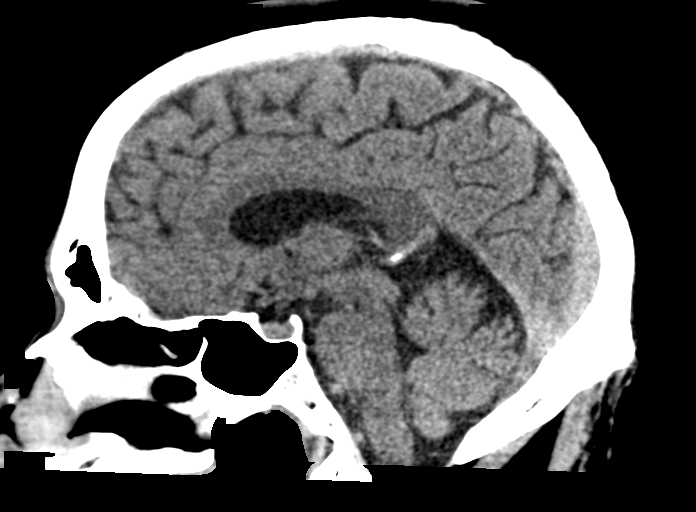
[im 40/67  brain]
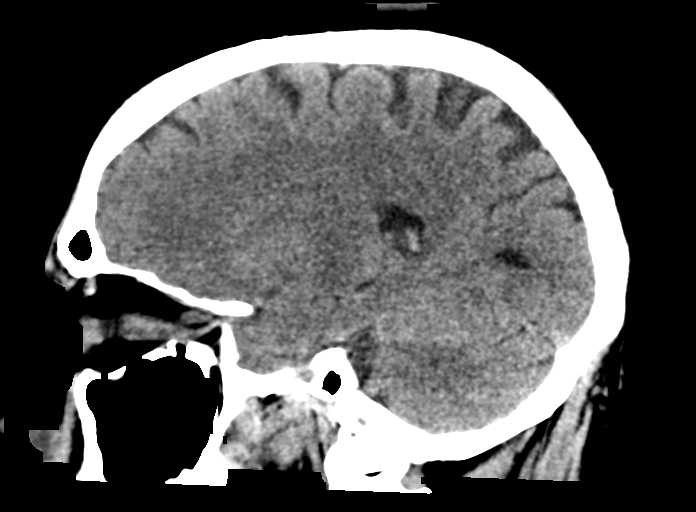

[15 of 47 positions shown; findings below may reference images not displayed]

FINDINGS: Brain: There is no acute intracranial hemorrhage, mass effect, or
edema. Gray-white differentiation is preserved. Ventricles and sulci
are within normal limits in size and configuration, noting cavum
septum pellucidum et vergae. There is no extra-axial collection

Vascular: No hyperdense vessel.

Skull: Unremarkable.

Sinuses/Orbits: Minor mucosal thickening. No acute orbital
abnormality.

Other: Mastoid air cells are clear.

ASPECTS (Alberta Stroke Program Early CT Score)

- Ganglionic level infarction (caudate, lentiform nuclei, internal
capsule, insula, M1-M3 cortex): 7

- Supraganglionic infarction (M4-M6 cortex): 3

Total score (0-10 with 10 being normal): 10
IMPRESSION: There is no acute intracranial hemorrhage or evidence of acute
infarction. ASPECT score is 10.

These results were communicated to Dr. RUDI at [DATE] on [DATE] by
text page via the [employer] messaging system.

## 2022-01-13 IMAGING — CT CT CERVICAL SPINE W/O CM
3 of 4 series · 13 of 33 positions shown, 16 images · non-contrast
Comparison: [DATE]

CLINICAL DATA: Facial trauma, blunt injury.



[Series 5: c_spine 2.0 st · axial · 0.38mm/px · z∈[-332,-214]mm · 5 of 89 slices shown, 7 images]
[im 15/89  soft-tissue]
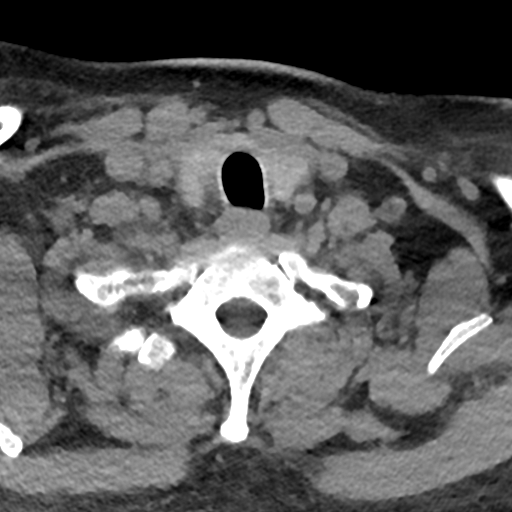
[im 15/89  bone]
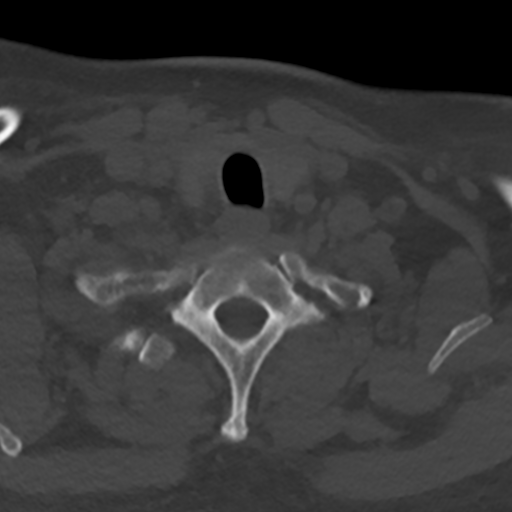
[im 30/89  bone]
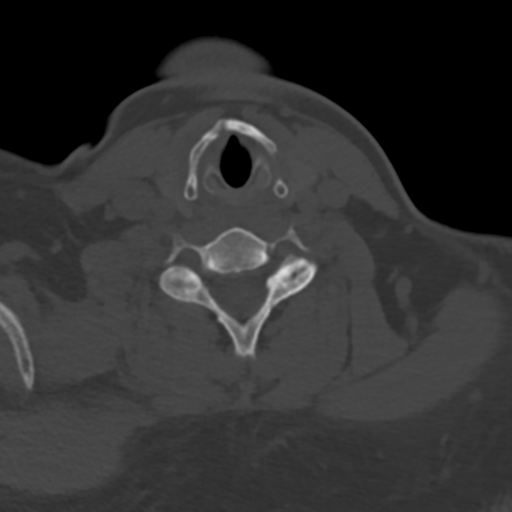
[im 45/89  bone]
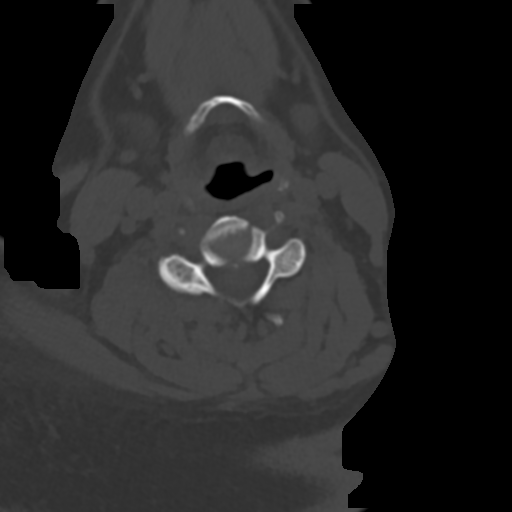
[im 59/89  bone]
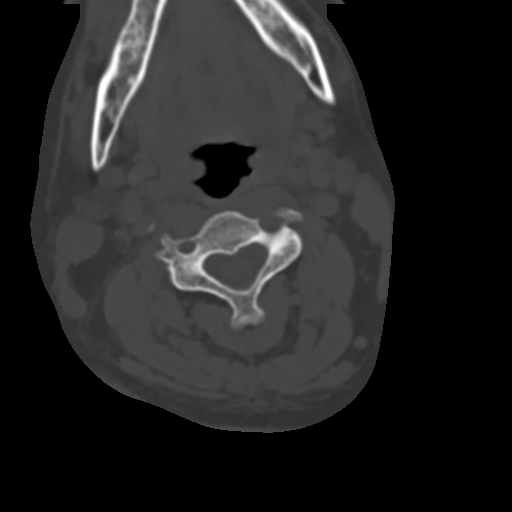
[im 74/89  soft-tissue]
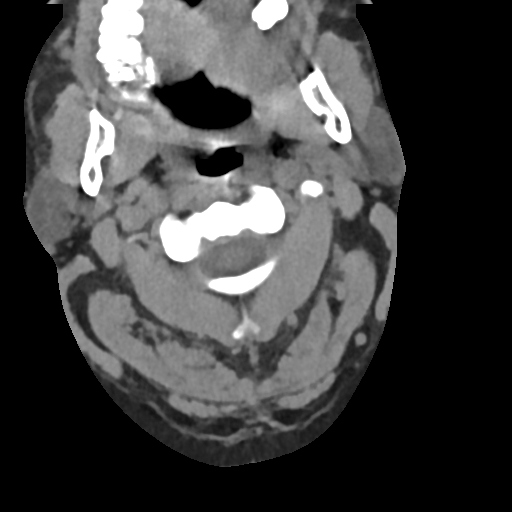
[im 74/89  bone]
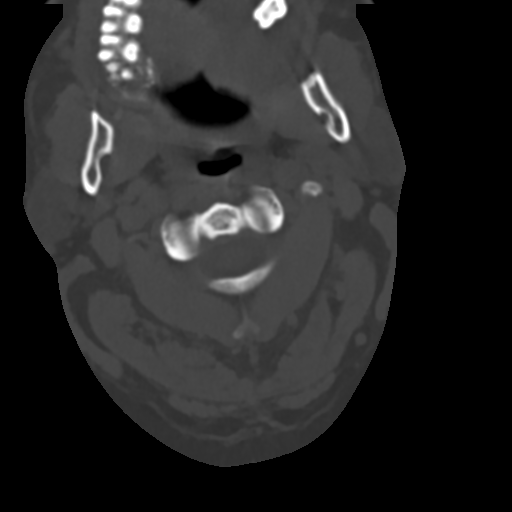

[Series 6: coronal bone · coronal · 0.26mm/px · 3 of 61 slices shown]
[im 13/61  bone]
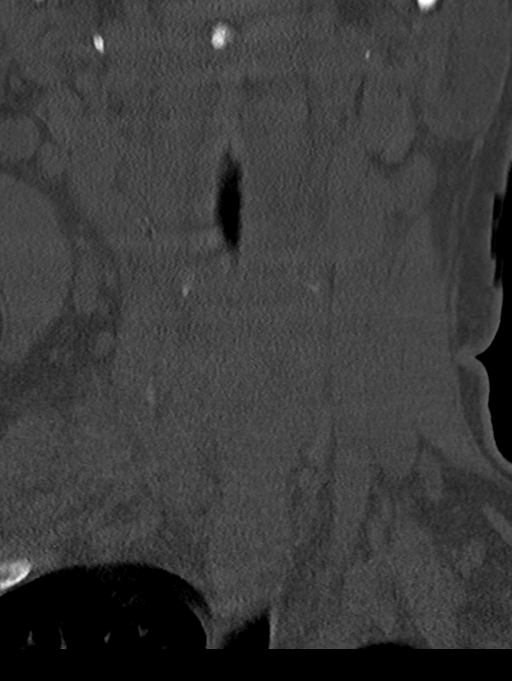
[im 25/61  bone]
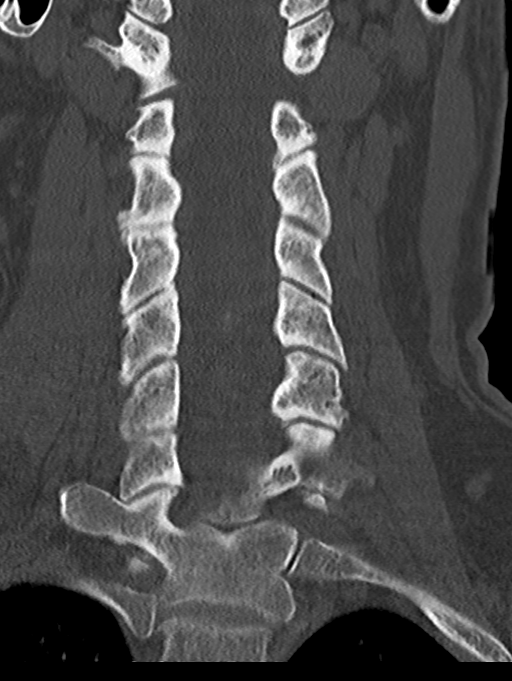
[im 37/61  bone]
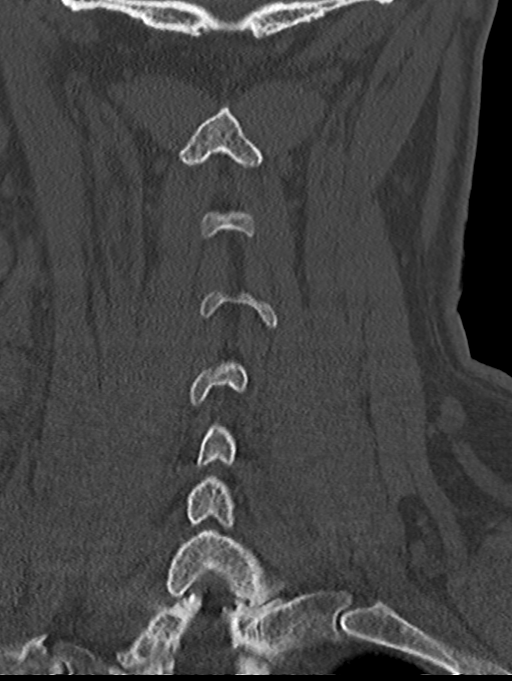

[Series 7: sagittal bone · sagittal · 0.26mm/px · 5 of 61 slices shown, 6 images]
[im 21/61  bone]
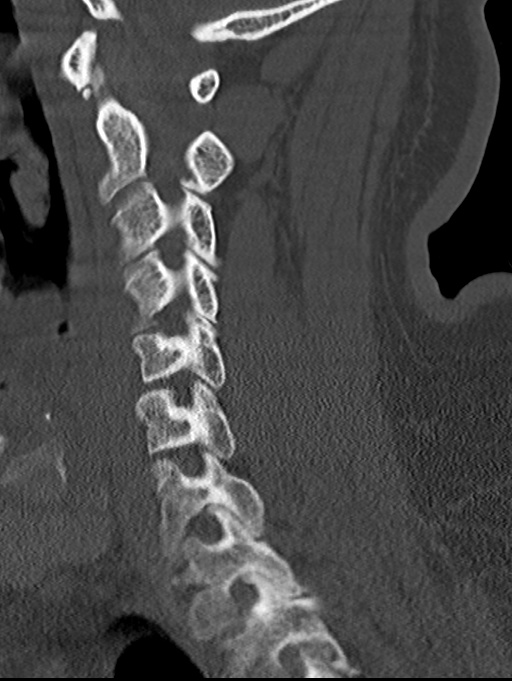
[im 26/61  bone]
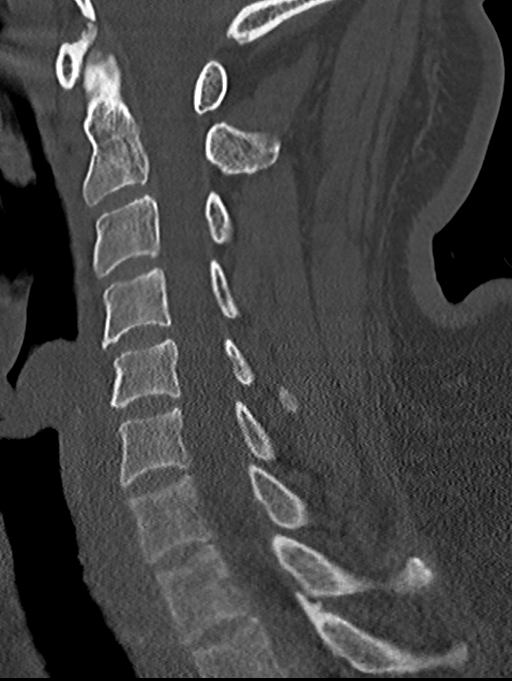
[im 31/61  soft-tissue]
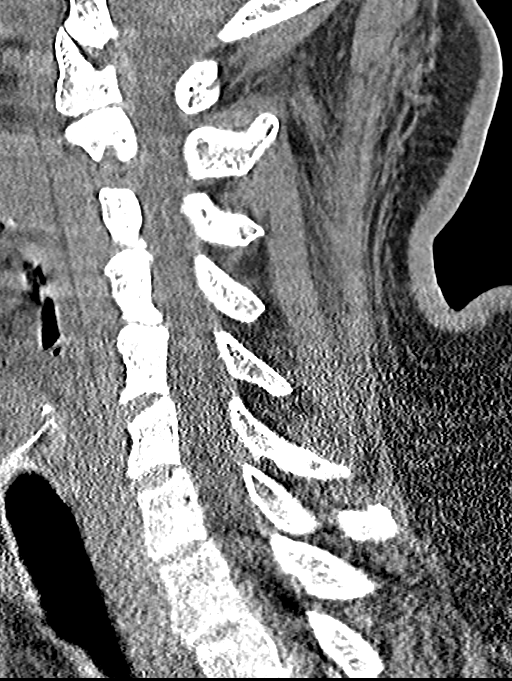
[im 31/61  bone]
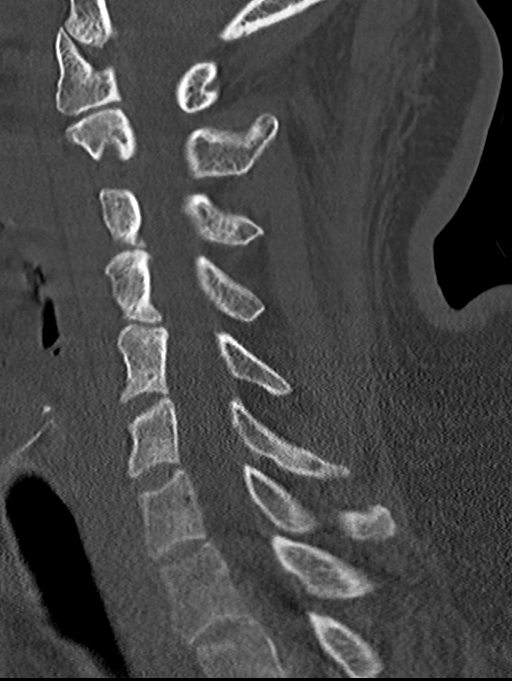
[im 36/61  bone]
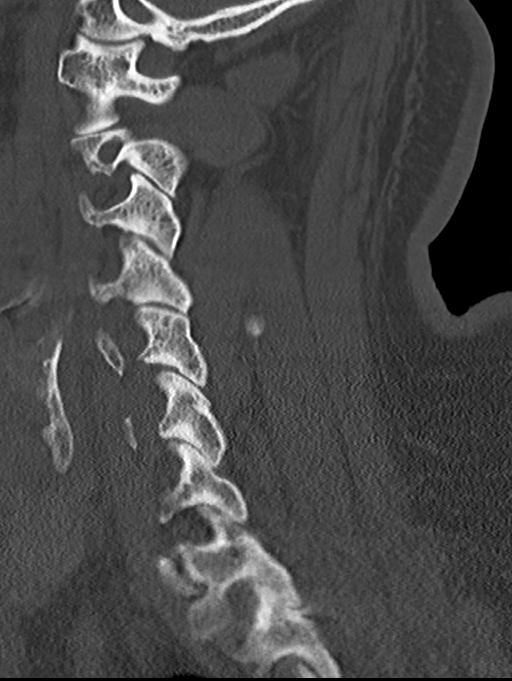
[im 41/61  bone]
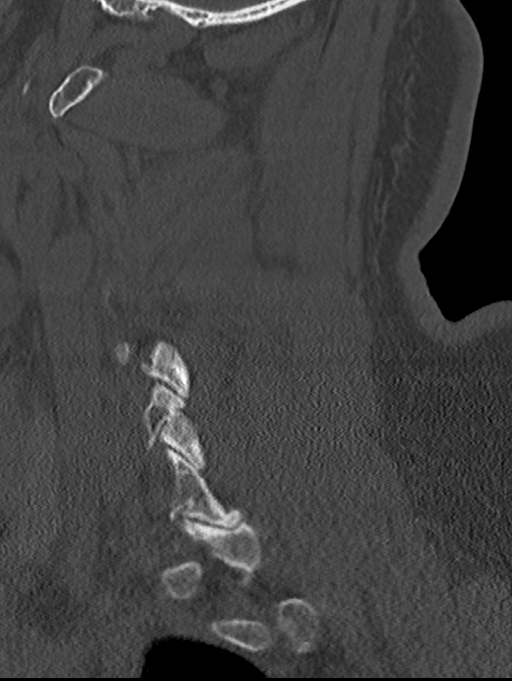

[13 of 33 positions shown; findings below may reference images not displayed]

FINDINGS: Alignment: Mild straightening of normal cervical lordotic curvature
is likely related to patient position.

Skull base and vertebrae: No acute fracture. No primary bone lesion
or focal pathologic process.

Soft tissues and spinal canal: No prevertebral fluid or swelling. No
visible canal hematoma.

Disc levels: Mild-to-moderate degenerative changes in the cervical
spine. Mild disc space narrowing at C3-4 and C4-5 as well as C5-6.
Moderate facet arthropathy on the RIGHT at C2-3, C3-4 and C4-5.

Upper chest: Negative.

Other: None
IMPRESSION: 1. No acute fracture or static subluxation of the cervical spine.
2. Mild-to-moderate degenerative changes in the cervical spine.

## 2022-01-13 MED ORDER — GLIPIZIDE ER 10 MG PO TB24
10.0000 mg | ORAL_TABLET | Freq: Every day | ORAL | Status: DC
  Administered 2022-01-14: 10 mg via ORAL
  Filled 2022-01-13 (×2): qty 1

## 2022-01-13 MED ORDER — CARVEDILOL 3.125 MG PO TABS: 3.1250 mg | ORAL_TABLET | Freq: Two times a day (BID) | ORAL | Status: DC

## 2022-01-13 MED ORDER — HYDROCHLOROTHIAZIDE 25 MG PO TABS: 25.0000 mg | ORAL_TABLET | Freq: Every day | ORAL | Status: DC

## 2022-01-13 MED ORDER — LOSARTAN POTASSIUM 50 MG PO TABS: 100.0000 mg | ORAL_TABLET | Freq: Every day | ORAL | Status: DC

## 2022-01-13 MED ORDER — STROKE: EARLY STAGES OF RECOVERY BOOK: Freq: Once | Status: DC

## 2022-01-13 MED ORDER — CLOPIDOGREL BISULFATE 75 MG PO TABS
75.0000 mg | ORAL_TABLET | Freq: Every day | ORAL | Status: DC
  Administered 2022-01-14: 75 mg via ORAL
  Filled 2022-01-13: qty 1

## 2022-01-13 MED ORDER — PRAZOSIN HCL 2 MG PO CAPS: 2.0000 mg | ORAL_CAPSULE | Freq: Every day | ORAL | Status: DC

## 2022-01-13 MED ORDER — ACETAMINOPHEN 325 MG PO TABS
650.0000 mg | ORAL_TABLET | ORAL | Status: DC | PRN
  Administered 2022-01-14: 650 mg via ORAL
  Filled 2022-01-13: qty 2

## 2022-01-13 MED ORDER — PRAZOSIN HCL 2 MG PO CAPS
3.0000 mg | ORAL_CAPSULE | Freq: Every day | ORAL | Status: DC
  Administered 2022-01-14: 3 mg via ORAL
  Filled 2022-01-13 (×2): qty 1

## 2022-01-13 MED ORDER — ACETAMINOPHEN 160 MG/5ML PO SOLN: 650.0000 mg | ORAL | Status: DC | PRN

## 2022-01-13 MED ORDER — OXYCODONE HCL 5 MG PO TABS
10.0000 mg | ORAL_TABLET | Freq: Once | ORAL | Status: AC
  Administered 2022-01-13: 10 mg via ORAL
  Filled 2022-01-13: qty 2

## 2022-01-13 MED ORDER — DULOXETINE HCL 60 MG PO CPEP
60.0000 mg | ORAL_CAPSULE | Freq: Every day | ORAL | Status: DC
  Administered 2022-01-14: 60 mg via ORAL
  Filled 2022-01-13: qty 1

## 2022-01-13 MED ORDER — PRAZOSIN HCL 1 MG PO CAPS: 1.0000 mg | ORAL_CAPSULE | Freq: Every day | ORAL | Status: DC

## 2022-01-13 MED ORDER — GADOBUTROL 1 MMOL/ML IV SOLN
10.0000 mL | Freq: Once | INTRAVENOUS | Status: AC | PRN
  Administered 2022-01-13: 10 mL via INTRAVENOUS

## 2022-01-13 MED ORDER — TAMSULOSIN HCL 0.4 MG PO CAPS
0.8000 mg | ORAL_CAPSULE | Freq: Every day | ORAL | Status: DC
  Administered 2022-01-14: 0.8 mg via ORAL
  Filled 2022-01-13: qty 2

## 2022-01-13 MED ORDER — AMLODIPINE BESYLATE 5 MG PO TABS: 10.0000 mg | ORAL_TABLET | Freq: Every day | ORAL | Status: DC

## 2022-01-13 MED ORDER — ENOXAPARIN SODIUM 40 MG/0.4ML IJ SOSY
40.0000 mg | PREFILLED_SYRINGE | INTRAMUSCULAR | Status: DC
  Administered 2022-01-14: 40 mg via SUBCUTANEOUS
  Filled 2022-01-13: qty 0.4

## 2022-01-13 MED ORDER — ACETAMINOPHEN 650 MG RE SUPP: 650.0000 mg | RECTAL | Status: DC | PRN

## 2022-01-13 NOTE — Assessment & Plan Note (Addendum)
-   Severe despite young age ?-Continue prazosin and Flomax ?

## 2022-01-13 NOTE — Assessment & Plan Note (Addendum)
-   Controlled, continue prazosin ?

## 2022-01-13 NOTE — Consult Note (Addendum)
Neurology Consultation ? ?Reason for Consult: Code Stroke ?Referring Physician: Dr Harrell Gave Tegeler ? ?CC: Right leg numbness ? ?History is obtained from:Patient  and chart review ? ?HPI: Jeffrey Barrera. is a 41 y.o. male with past medical history of anxiety, asthma, blind in left eye, coronary artery disease, depression, type 2 diabetes, hypertension, hyperlipidemia, tobacco abuse, obstructive sleep apnea, who presented to [facility] ED from [place] after he fell a total of 3 times according to patient yesterday and today and hit his head on the floor.  Denied loss of consciousness.  His right leg was numb and thengaveoutonhim, he was assisted out of went to the bathroom and fell again.  Patient states he has had history of stroke in the past. ? ? ?LKW: 8 PM last night on 01/12/2022 ?TNK given?: no, outside the time window for IV TNK ?IR Thrombectomy? No, no large vessel occlusion ?Modified Rankin Scale: 1-No significant post stroke disability and can perform usual duties with stroke symptoms ? ?ROS: A complete ROS was performed and is negative except as noted in the HPI.  ?Past Medical History:  ?Diagnosis Date  ? Anxiety   ? Arthritis   ? Asthma   ? Blind left eye   ? Claudication ([place]) 04/02/2017  ? Coronary artery disease involving native coronary artery of native heart without angina pectoris 04/02/2017  ? Depression   ? Essential hypertension 03/07/2017  ? Glucose intolerance (impaired glucose tolerance) 03/15/2017  ? Hypertension   ? Inflamed external hemorrhoid   ? Inflamed internal hemorrhoid   ? Internal and external bleeding hemorrhoids 2019  ? Myocardial infarction [facility])   ? Paresthesia of lower extremity 04/02/2017  ? Pure hypercholesterolemia 03/07/2017  ? Snoring 03/13/2018  ? Tobacco abuse 03/07/2017  ? ? ?Family History  ?Problem Relation Age of Onset  ? Hypertension Mother   ? Heart attack Mother   ? Heart disease Mother   ? Hypertension Father   ? ? ? ?Social History:  ? reports that he has been  smoking cigarettes. He quit smokeless tobacco use about 23 years ago.  His smokeless tobacco use included chew. He reports that he does not currently use alcohol. He reports current drug use. Drug: Marijuana. ? ?Medications ?No current facility-administered medications for this encounter. ? ?Current Outpatient Medications:  ?  albuterol (VENTOLIN HFA) 108 (90 Base) MCG/ACT inhaler, Inhale 1-2 puffs into the lungs every 6 (six) hours as needed for wheezing or shortness of breath., Disp: 18 g, Rfl: 1 ?  amLODipine (NORVASC) 10 MG tablet, Take 1 tablet (10 mg total) by mouth daily., Disp: 90 tablet, Rfl: 3 ?  atorvastatin (LIPITOR) 40 MG tablet, Take 1 tablet (40 mg total) by mouth at bedtime., Disp: 90 tablet, Rfl: 3 ?  benzocaine (AMERICAINE) 20 % rectal ointment, Place rectally every 3 (three) hours as needed for pain., Disp: 28.4 g, Rfl: 0 ?  benzonatate (TESSALON) 100 MG capsule, Take 1 capsule (100 mg total) by mouth every 8 (eight) hours., Disp: 21 capsule, Rfl: 0 ?  blood glucose meter kit and supplies, Dispense based on patient and insurance preference. Use up to four times daily as directed. (FOR ICD-10 E10.9, E11.9)., Disp: 1 each, Rfl: 0 ?  Blood Glucose Monitoring Suppl (TRUE METRIX METER) w/Device KIT, Use as instructed, Disp: 1 kit, Rfl: 0 ?  buPROPion (WELLBUTRIN SR) 200 MG 12 hr tablet, Take 1 tablet (200 mg total) by mouth 2 (two) times daily. (Patient not taking: Reported on 06/30/2020), Disp: 60 tablet,  Rfl: 11 ?  chlorhexidine (PERIDEX) 0.12 % solution, Use as directed 15 mLs in the mouth or throat 2 (two) times daily. (Patient taking differently: Use as directed 15 mLs in the mouth or throat 2 (two) times daily as needed (sore throat). ), Disp: 473 mL, Rfl: 1 ?  furosemide (LASIX) 40 MG tablet, Take 1 tablet (40 mg total) by mouth daily as needed for edema., Disp: 15 tablet, Rfl: 0 ?  gabapentin (NEURONTIN) 400 MG capsule, Take 2 capsules (800 mg total) by mouth 3 (three) times daily., Disp: 180  capsule, Rfl: 11 ?  glucose blood (TRUE METRIX BLOOD GLUCOSE TEST) test strip, Use as instructed. Check blood glucose level by fingerstick twice per day.  E11.65, Disp: 100 each, Rfl: 12 ?  hydrocortisone (PROCTOSOL HC) 2.5 % rectal cream, Place 1 application rectally 4 (four) times daily., Disp: 28 g, Rfl: 11 ?  Insulin Pen Needle (B-D UF III MINI PEN NEEDLES) 31G X 5 MM MISC, Use as instructed. Inject into the skin once nightly., Disp: 90 each, Rfl: 1 ?  losartan-hydrochlorothiazide (HYZAAR) 100-25 MG tablet, Take 1 tablet by mouth daily., Disp: 90 tablet, Rfl: 3 ?  metFORMIN (GLUCOPHAGE) 500 MG tablet, Take 2 tablets (1,000 mg total) by mouth 2 (two) times daily with a meal. (Patient not taking: Reported on 06/30/2020), Disp: 360 tablet, Rfl: 3 ?  metoprolol tartrate (LOPRESSOR) 25 MG tablet, Take 1 tablet (25 mg total) by mouth 2 (two) times daily., Disp: 60 tablet, Rfl: 11 ?  nitroGLYCERIN (NITROSTAT) 0.4 MG SL tablet, Place 1 tablet (0.4 mg total) under the tongue every 5 (five) minutes as needed for chest pain., Disp: 25 tablet, Rfl: 11 ?  PARoxetine (PAXIL) 20 MG tablet, Take 1 tablet (20 mg total) by mouth daily., Disp: 30 tablet, Rfl: 11 ?  PRAZOSIN HCL PO, Take 1 mg by mouth at bedtime., Disp: , Rfl:  ?  SUMAtriptan (IMITREX) 100 MG tablet, Take 1 tablet (100 mg total) by mouth every 2 (two) hours as needed for migraine (Max 2 tablets daily). Repeat in 2 hrs if headache persists, Disp: 9 tablet, Rfl: 2 ?  traZODone (DESYREL) 150 MG tablet, Take 1 tablet (150 mg total) by mouth at bedtime. (Patient taking differently: Take 200 mg by mouth at bedtime. ), Disp: 30 tablet, Rfl: 11 ?  TRUEplus Lancets 28G MISC, Use as instructed. Check blood glucose level by fingerstick twice per day.  E11.65, Disp: 100 each, Rfl: 3 ? ? ?Exam: ?Current vital signs: ?BP 120/74 (BP Location: Right Arm)   Pulse 69   Temp 98.3 ?F (36.8 ?C) (Oral)   Resp 12   SpO2 91%  ?Vital signs in last 24 hours: ?Temp:  [98.3 ?F (36.8 ?C)]  98.3 ?F (36.8 ?C) (04/28 1645) ?Pulse Rate:  [69] 69 (04/28 1645) ?Resp:  [12] 12 (04/28 1645) ?BP: (120)/(74) 120/74 (04/28 1645) ?SpO2:  [91 %] 91 % (04/28 1645) ? ?GENERAL: Awake, alert, in no acute distress ?Psych: Affect appropriate for situation, patient is calm and cooperative with examination ?Head: Normocephalic and atraumatic, without obvious abnormality ?EENT: Normal conjunctivae, dry mucous membranes, no OP obstruction ?LUNGS: Normal respiratory effort. Non-labored breathing on room air ?CV: Regular rate and rhythm on telemetry ?ABDOMEN: Soft, non-tender, non-distended ?Extremities: warm, well perfused, without obvious deformity ? ?NEURO:  ?Mental Status: Awake, alert, and oriented to person, place, time, and situation. ?He/She is able to provide a clear and coherent history of present illness. ?Speech/Language: speech is clear  ?Naming, repetition, fluency,  and comprehension intact without aphasia  ?No neglect is noted ?Cranial Nerves:  ?II: PERRL__3__mm/brisk. Blind in left eye ?visual fields full.  ?III, IV, VI: EOMI. Lid elevation symmetric and full.  ?V: Sensation is intact to light touch and symmetrical to face. Blinks to threat. Moves jaw back and forth.  ?VII: Face is symmetric resting and smiling. Able to puff cheeks and raise eyebrows.  ?VIII: Hearing intact to voice ?IX, X: Palate elevation is symmetric. Phonation normal.  ?XI: Normal sternocleidomastoid and trapezius muscle strength ?XII: Tongue protrudes midline without fasciculations.   ?Motor: 35 strength is right leg and 5/5 in BUE and left LE groups.  ?Tone is normal. Bulk is normal.  ?Sensation: Intact to light touch bilaterally in all four extremities. No extinction to DSS present.  ?Coordination: FTN intact bilaterally. HKS intact bilaterally. No pronator drift. Alternating hand movements.  ?DTRs: 2+ throughout.  ?Gait: Deferred ?  ?    ?NIHSS components Score: Comment  ?1a Level of Conscious 0'[x]'  1'[]'  2'[]'  3'[]'      ?1b LOC Questions  0'[x]'  1'[]'  2'[]'       ?1c LOC Commands 0'[x]'  1'[]'  2'[]'       ?2 Best Gaze 0'[x]'  1'[]'  2'[]'       ?3 Visual 0'[x]'  1'[]'  2'[]'  3'[]'      ?4 Facial Palsy 0'[x]'  1'[]'  2'[]'  3'[]'      ?5a Motor Arm - left 0'[x]'  1'[]'  2'[]'  3'[]'  4'[]'  UN'[]'    ?5b Motor Arm

## 2022-01-13 NOTE — Code Documentation (Signed)
Pt is a 41 yr old male with history of autism, HTN, MI, CAD who [facility] following syncopal episode. He reports that his rt leg has been numb since last night at 2000. He fell off toilet due to the numbness in his leg.He is on no antithrombotics. Pt was cleared by EDP, labs and CBG obtained in hallway 12. Pt to CTNC at 1740. CTNC negative for acute hemorrhage per Dr. Roda Shutters. Pt has a contrast allergy, so MRI/ MRA ordered. Pt w/ stents in leg, needs an X ray to clear for MRI. ?

## 2022-01-13 NOTE — Assessment & Plan Note (Addendum)
-   RLE numbness and weakness per patient ?-MRI brain as well as MRA head/neck are negative for stroke or acute abnormalities ?-He has been evaluated by neurology as well and also evaluated by PT.  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  ?

## 2022-01-13 NOTE — Progress Notes (Signed)
Pt able to walk to restroom unassisted. NIHSS now 1.MRI beginning now. EDRN Alexa with pt. RN instructed to call rapid response if she needs assistance. ?

## 2022-01-13 NOTE — Assessment & Plan Note (Addendum)
-   on glipizide prior to admission but given A1c 5.6% likely can be controlled with ongoing diet and his history of dramatic weight loss ?

## 2022-01-13 NOTE — ED Notes (Signed)
Pt ambulatory to restroom in MRI with assistance.  ?

## 2022-01-13 NOTE — ED Provider Notes (Signed)
?[facility] ?Provider Note ? ? ?CSN: [id number] ?Arrival date & time: 01/13/22  1637 ? ?  ? ?History ? ?Chief Complaint  ?Patient presents with  ? Fall  ? ? ?Jeffrey CrowJames Diperna Jr. is a 41 y.o. male. ? ? ?Fall ? ?Patient is a 41 year old male with a history of autism, hypertension, MI, CAD, paresthesia of the lower extremity who [facility] the emergency department from [facility] after syncopal episode.  He reportedly was using the bathroom and turned around to go to his bed which point his right leg felt completely.  He then passed out and does not remember what exactly happened.  It is unclear exactly when this incident happened but the officer with family states around 1500.  He also reports multiple falls yesterday.  Patient currently endorses right leg complete numbness which she reports is similar to when he did get his previous stroke.  In addition, he also reports occipital head pain.  He denies any recent fever, vomiting or diarrhea.  He does report urinary irregularities.  He reports he was recently diagnosed with multiple kidney stones in his urine frequency has been different than his regular.  He denies any pain other than his head and cervical spine.  Otherwise no other complaints. ? ?Home Medications ?Prior to Admission medications   ?Medication Sig Start Date End Date Taking? Authorizing Provider  ?amLODipine (NORVASC) 10 MG tablet Take 1 tablet (10 mg total) by mouth daily. 04/08/20 01/13/22 Yes Barbette MerinoKing, Crystal M, NP  ?amoxicillin-clavulanate (AUGMENTIN) 875-125 MG tablet Take 1 tablet by mouth See admin instructions. Bid x 7 days   Yes [provider]  ?carvedilol (COREG) 3.125 MG tablet Take 3.125 mg by mouth 2 (two) times daily with a meal.   Yes [provider]  ?clopidogrel (PLAVIX) 75 MG tablet Take 75 mg by mouth daily.   Yes [provider]  ?doxycycline (VIBRAMYCIN) 100 MG capsule Take 100 mg by mouth See admin instructions. Bid x 10 days    Yes [provider]  ?DULoxetine (CYMBALTA) 60 MG capsule Take 60 mg by mouth daily.   Yes [provider]  ?glipiZIDE (GLUCOTROL XL) 10 MG 24 hr tablet Take 10 mg by mouth daily with breakfast.   Yes [provider]  ?hydrochlorothiazide (HYDRODIURIL) 25 MG tablet Take 25 mg by mouth daily.   Yes [provider]  ?losartan (COZAAR) 100 MG tablet Take 100 mg by mouth daily.   Yes [provider]  ?nitroGLYCERIN (NITROSTAT) 0.4 MG SL tablet Place 1 tablet (0.4 mg total) under the tongue every 5 (five) minutes as needed for chest pain. 07/04/18 01/13/22 Yes Baldo DaubMunley, Brian J, MD  ?prazosin (MINIPRESS) 1 MG capsule Take 1 mg by mouth at bedtime. Take along with 2 mg   Yes [provider]  ?prazosin (MINIPRESS) 2 MG capsule Take 2 mg by mouth at bedtime. Take along with 1 mg   Yes [provider]  ?tamsulosin (FLOMAX) 0.4 MG CAPS capsule Take 0.8 mg by mouth daily.   Yes [provider]  ?acetaminophen (TYLENOL) 325 MG tablet Take 650 mg by mouth See admin instructions. Bid x 10 days ?Patient not taking: Reported on 01/13/2022    [provider]  ?albuterol (VENTOLIN HFA) 108 (90 Base) MCG/ACT inhaler Inhale 1-2 puffs into the lungs every 6 (six) hours as needed for wheezing or shortness of breath. ?Patient not taking: Reported on 01/13/2022 04/08/20   Barbette MerinoKing, Crystal M, NP  ?atorvastatin (LIPITOR) 40  MG tablet Take 1 tablet (40 mg total) by mouth at bedtime. ?Patient not taking: Reported on 01/13/2022 04/08/20   Barbette Merino, NP  ?benzocaine (AMERICAINE) 20 % rectal ointment Place rectally every 3 (three) hours as needed for pain. ?Patient not taking: Reported on 01/13/2022 04/15/20   Barbette Merino, NP  ?benzonatate (TESSALON) 100 MG capsule Take 1 capsule (100 mg total) by mouth every 8 (eight) hours. ?Patient not taking: Reported on 01/13/2022 07/11/21   Madelyn Brunner, DO  ?buPROPion [facility] SR) 200 MG 12 hr tablet Take 1 tablet (200 mg  total) by mouth 2 (two) times daily. ?Patient not taking: Reported on 06/30/2020 04/08/20 04/08/21  Barbette Merino, NP  ?chlorhexidine (PERIDEX) 0.12 % solution Use as directed 15 mLs in the mouth or throat 2 (two) times daily. ?Patient not taking: Reported on 01/13/2022 09/02/19   Claiborne Rigg, NP  ?furosemide (LASIX) 40 MG tablet Take 1 tablet (40 mg total) by mouth daily as needed for edema. ?Patient not taking: Reported on 01/13/2022 02/26/20 06/30/20  Barbette Merino, NP  ?gabapentin (NEURONTIN) 400 MG capsule Take 2 capsules (800 mg total) by mouth 3 (three) times daily. ?Patient not taking: Reported on 01/13/2022 04/08/20 04/08/21  Barbette Merino, NP  ?hydrocortisone (PROCTOSOL HC) 2.5 % rectal cream Place 1 application rectally 4 (four) times daily. ?Patient not taking: Reported on 01/13/2022 04/08/20   Barbette Merino, NP  ?losartan-hydrochlorothiazide (HYZAAR) 100-25 MG tablet Take 1 tablet by mouth daily. ?Patient not taking: Reported on 01/13/2022 04/08/20   Barbette Merino, NP  ?metFORMIN (GLUCOPHAGE) 500 MG tablet Take 2 tablets (1,000 mg total) by mouth 2 (two) times daily with a meal. ?Patient not taking: Reported on 06/30/2020 04/08/20 04/08/21  Barbette Merino, NP  ?metoprolol tartrate (LOPRESSOR) 25 MG tablet Take 1 tablet (25 mg total) by mouth 2 (two) times daily. ?Patient not taking: Reported on 01/13/2022 04/08/20 04/08/21  Barbette Merino, NP  ?PARoxetine (PAXIL) 20 MG tablet Take 1 tablet (20 mg total) by mouth daily. ?Patient not taking: Reported on 01/13/2022 04/08/20 04/08/21  Barbette Merino, NP  ?SUMAtriptan (IMITREX) 100 MG tablet Take 1 tablet (100 mg total) by mouth every 2 (two) hours as needed for migraine (Max 2 tablets daily). Repeat in 2 hrs if headache persists ?Patient not taking: Reported on 01/13/2022 04/08/20 06/30/20  Barbette Merino, NP  ?traZODone (DESYREL) 150 MG tablet Take 1 tablet (150 mg total) by mouth at bedtime. ?Patient taking differently: Take 200 mg by mouth at bedtime.   04/08/20 04/08/21  Barbette Merino, NP  ?   ? ?Allergies    ?Asa [aspirin], Lemon flavor, Other, Penicillins, Iodine, Lisinopril, Sulfabenzamide, Clindamycin, and Erythromycin   ? ?Review of Systems   ?Review of Systems ? ?Physical Exam ?Updated Vital Signs ?BP (!) 146/86   Pulse 63   Temp 98.3 ?F (36.8 ?C) (Oral)   Resp 18   SpO2 100%  ?Physical Exam ?Constitutional:   ?   General: He is not in acute distress. ?   Appearance: He is not ill-appearing.  ?HENT:  ?   Head:  ?   Comments: Occipital hematoma towards the left side, no laceration or active bleeding ?   Nose: No congestion.  ?   Mouth/Throat:  ?   Mouth: Mucous membranes are moist.  ?   Pharynx: Oropharynx is clear.  ?Eyes:  ?   Comments: Vision loss on the left.  Pupil is reactive on the right.  Patient is able to cross midline without any gaze preference.  ?Cardiovascular:  ?   Rate and Rhythm: Normal rate.  ?Pulmonary:  ?   Effort: No respiratory distress.  ?   Breath sounds: No wheezing.  ?Chest:  ?   Chest wall: No tenderness.  ?Abdominal:  ?   Tenderness: There is no abdominal tenderness. There is no guarding or rebound.  ?Musculoskeletal:     ?   General: No deformity or signs of injury. Normal range of motion.  ?   Cervical back: Tenderness present. No rigidity.  ?Skin: ?   Capillary Refill: Capillary refill takes less than 2 seconds.  ?Neurological:  ?   Mental Status: He is alert and oriented to person, place, and time.  ?   Sensory: Sensory deficit present.  ?   Motor: No weakness.  ?   Comments: Sensation loss on the right lower extremity starting from the right hip all the way down to the toe.  Does not withdraw on pinprick.  No sensation loss on the left side.    ? ? ?ED Results / Procedures / Treatments   ?Labs ?(all labs ordered are listed, but only abnormal results are displayed) ?Labs Reviewed  ?I-STAT CHEM 8, ED - Abnormal; Notable for the following components:  ?    Result Value  ? Glucose, Bld 100 (*)   ? All other components within  normal limits  ?CBG MONITORING, ED - Abnormal; Notable for the following components:  ? Glucose-Capillary 100 (*)   ? All other components within normal limits  ?RESP PANEL BY RT-PCR (FLU A&B, COVID) [id number]  ?ET

## 2022-01-13 NOTE — ED Triage Notes (Signed)
Patient [facility]IB [facility] after he fell of the toilet and hit his head on the floor, hematoma on left side of head. No LOC. Patient is alert, oriented, and in police custody. ? ?EMS Vitals ?BP 158/92 ?HR 68 ?99% on room air ?

## 2022-01-14 ENCOUNTER — Observation Stay (HOSPITAL_COMMUNITY)

## 2022-01-14 ENCOUNTER — Observation Stay (HOSPITAL_BASED_OUTPATIENT_CLINIC_OR_DEPARTMENT_OTHER)

## 2022-01-14 DIAGNOSIS — F449 Dissociative and conversion disorder, unspecified: Secondary | ICD-10-CM | POA: Diagnosis not present

## 2022-01-14 DIAGNOSIS — R202 Paresthesia of skin: Secondary | ICD-10-CM

## 2022-01-14 DIAGNOSIS — M792 Neuralgia and neuritis, unspecified: Secondary | ICD-10-CM

## 2022-01-14 DIAGNOSIS — I10 Essential (primary) hypertension: Secondary | ICD-10-CM

## 2022-01-14 DIAGNOSIS — E119 Type 2 diabetes mellitus without complications: Secondary | ICD-10-CM

## 2022-01-14 DIAGNOSIS — N401 Enlarged prostate with lower urinary tract symptoms: Secondary | ICD-10-CM | POA: Diagnosis not present

## 2022-01-14 DIAGNOSIS — R2990 Unspecified symptoms and signs involving the nervous system: Secondary | ICD-10-CM

## 2022-01-14 DIAGNOSIS — R3916 Straining to void: Secondary | ICD-10-CM

## 2022-01-14 DIAGNOSIS — W19XXXA Unspecified fall, initial encounter: Secondary | ICD-10-CM | POA: Diagnosis not present

## 2022-01-14 LAB — ECHOCARDIOGRAM COMPLETE
AR max vel: 2.72 cm2
AV Area VTI: 2.65 cm2
AV Area mean vel: 2.6 cm2
AV Mean grad: 4 mmHg
AV Peak grad: 6.9 mmHg
Ao pk vel: 1.31 m/s
Area-P 1/2: 2.39 cm2
Calc EF: 54.4 %
MV VTI: 2.61 cm2
S' Lateral: 3 cm
Single Plane A2C EF: 54.4 %
Single Plane A4C EF: 54.2 %

## 2022-01-14 LAB — LIPID PANEL
Cholesterol: 139 mg/dL (ref 0–200)
HDL: 26 mg/dL — ABNORMAL LOW (ref >40)
LDL Cholesterol: 89 mg/dL (ref 0–99)
Total CHOL/HDL Ratio: 5.3 RATIO
Triglycerides: 119 mg/dL (ref <150)
VLDL: 24 mg/dL (ref 0–40)

## 2022-01-14 LAB — TSH: TSH: 2.392 u[IU]/mL (ref 0.350–4.500)

## 2022-01-14 LAB — HEMOGLOBIN A1C
Hgb A1c MFr Bld: 5.6 % (ref 4.8–5.6)
Mean Plasma Glucose: 114.02 mg/dL

## 2022-01-14 LAB — GLUCOSE, CAPILLARY
Glucose-Capillary: 145 mg/dL — ABNORMAL HIGH (ref 70–99)
Glucose-Capillary: 121 mg/dL — ABNORMAL HIGH (ref 70–99)
Glucose-Capillary: 117 mg/dL — ABNORMAL HIGH (ref 70–99)

## 2022-01-14 LAB — HIV ANTIBODY (ROUTINE TESTING W REFLEX): HIV Screen 4th Generation wRfx: NONREACTIVE

## 2022-01-14 IMAGING — MR MR LUMBAR SPINE W/O CM
4 of 5 series · 26 of 48 positions shown · non-contrast
Comparison: X-ray [DATE]

CLINICAL DATA: Right leg weakness

EXAM:
MRI LUMBAR SPINE WITHOUT CONTRAST
TECHNIQUE: Multiplanar, multisequence MR imaging of the lumbar spine was
performed. No intravenous contrast was administered.

[Series 5: T2 · sagittal · 4.0mm · 0.73mm/px · 6 of 15 slices shown (1 of 2)]
[im 1/15]
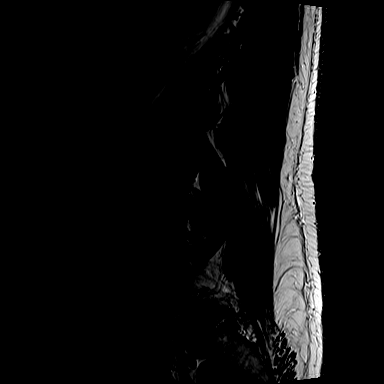
[im 3/15]
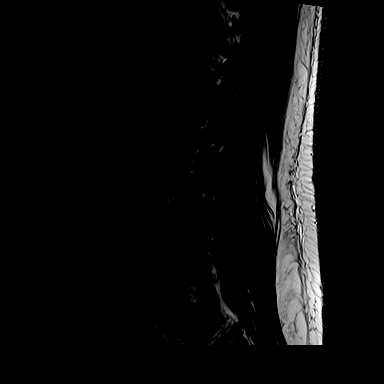
[im 6/15]
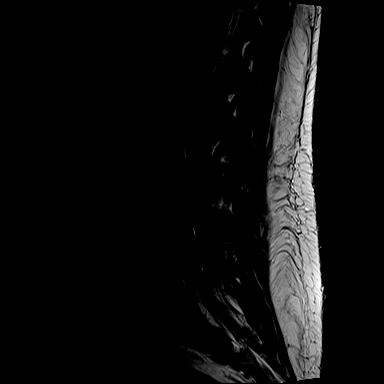
[im 9/15]
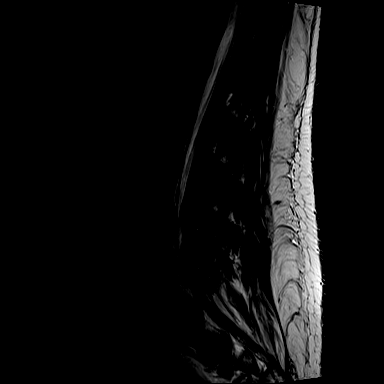
[im 12/15]
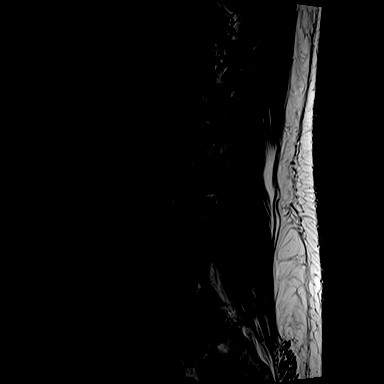
[im 15/15]
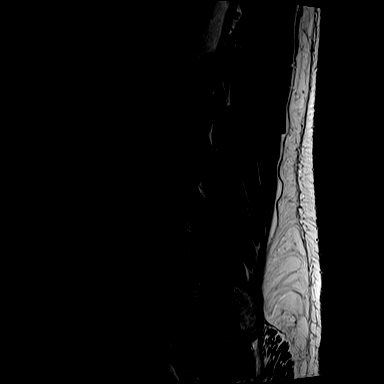

[Series 7: T1 · sagittal · 4.0mm · 0.88mm/px · 6 of 15 slices shown (1 of 2)]
[im 1/15]
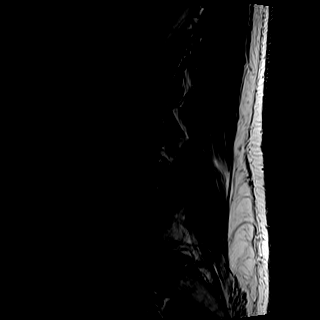
[im 3/15]
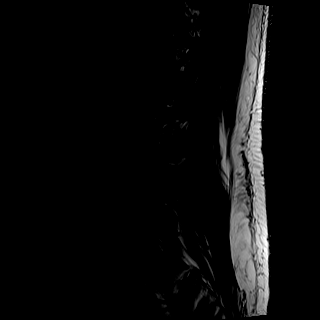
[im 6/15]
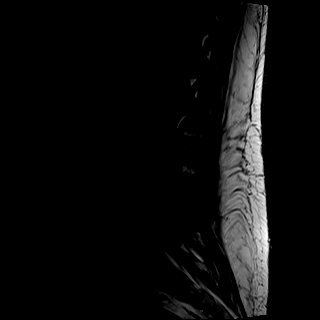
[im 9/15]
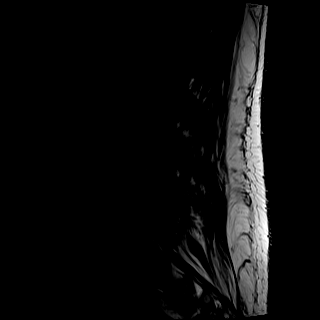
[im 12/15]
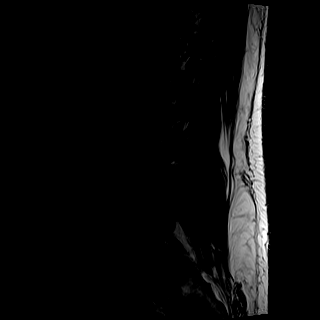
[im 15/15]
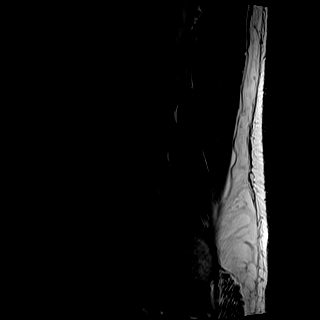

[Series 8: T2 · axial · 4.0mm · 0.57mm/px · z∈[-113,+103]mm · 9 of 36 slices shown (2 of 2)]
[im 1/36]
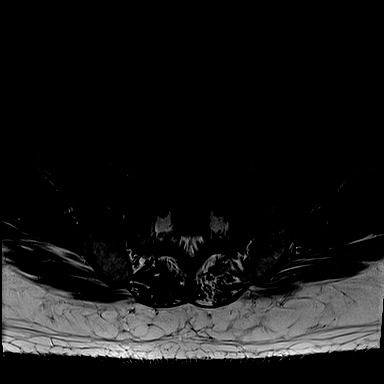
[im 6/36]
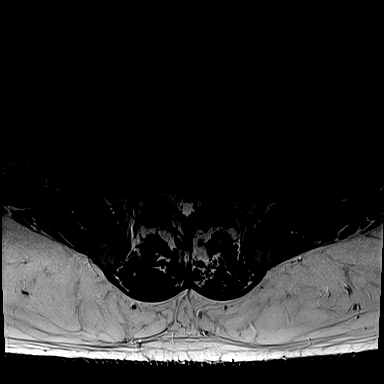
[im 11/36]
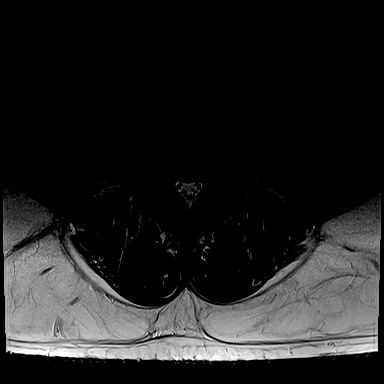
[im 16/36]
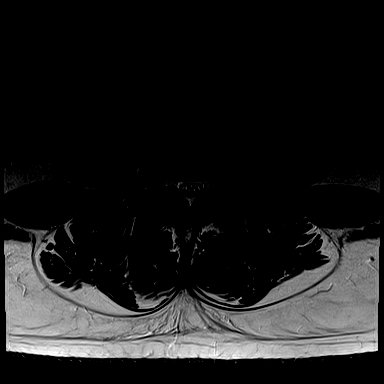
[im 18/36]
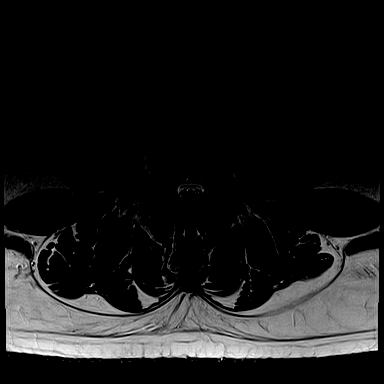
[im 21/36]
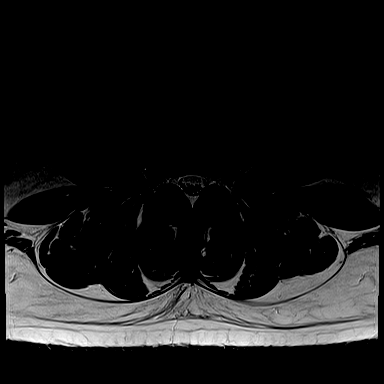
[im 26/36]
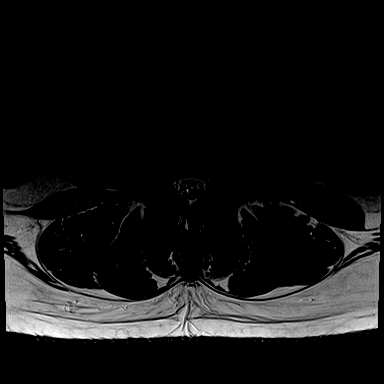
[im 31/36]
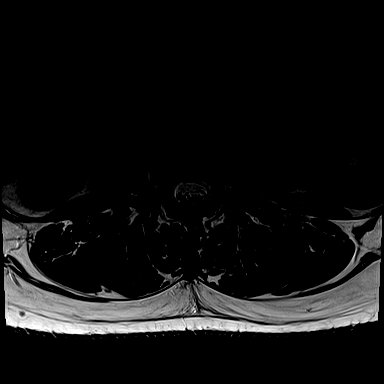
[im 36/36]
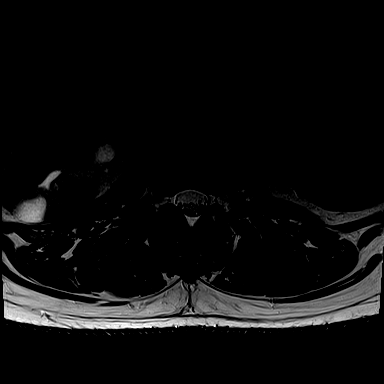

[Series 9: T1 · axial · 4.0mm · 0.34mm/px · z∈[-113,+78]mm · 5 of 36 slices shown (2 of 2)]
[im 1/36]
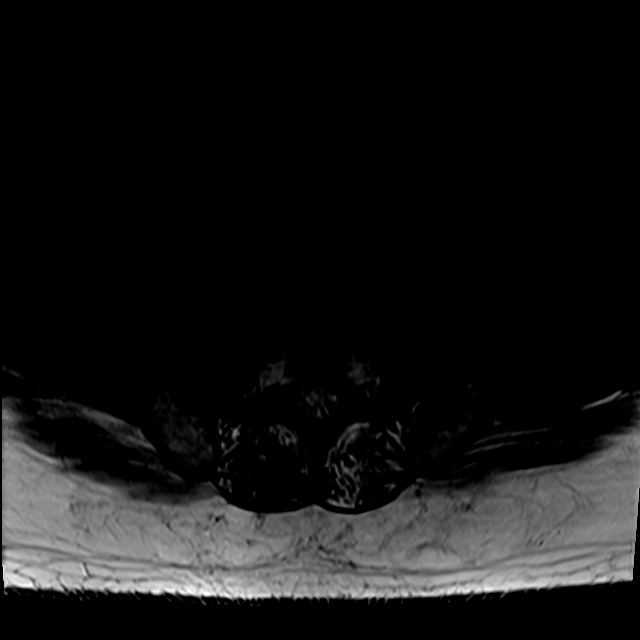
[im 6/36]
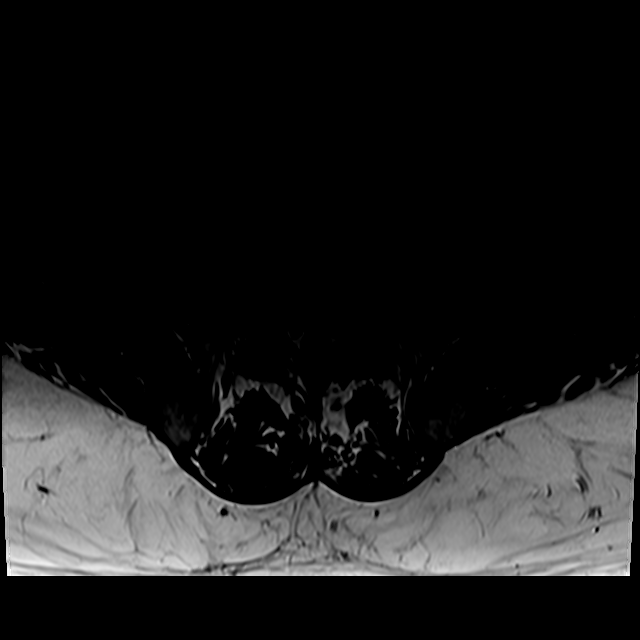
[im 11/36]
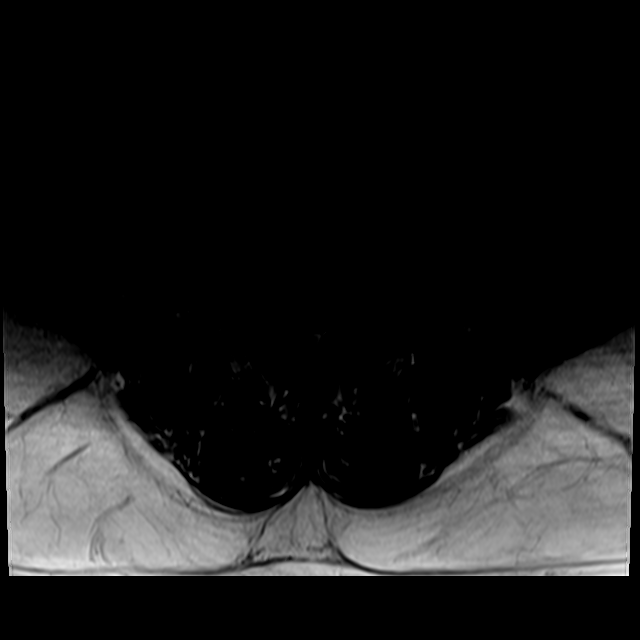
[im 18/36]
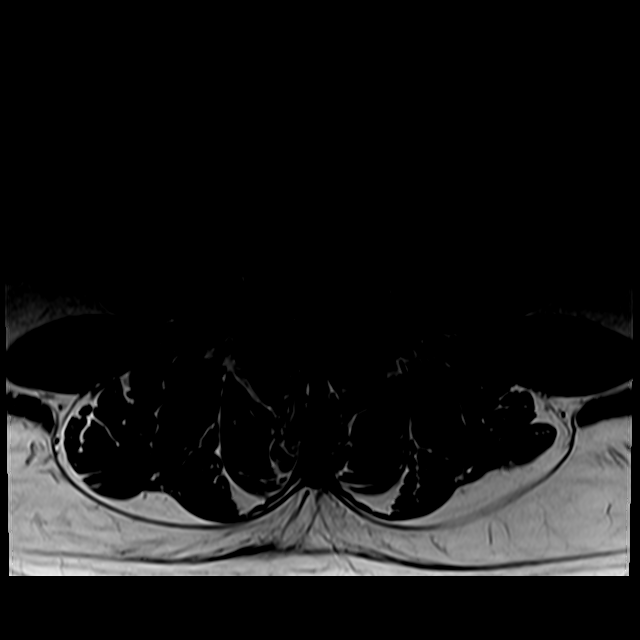
[im 31/36]
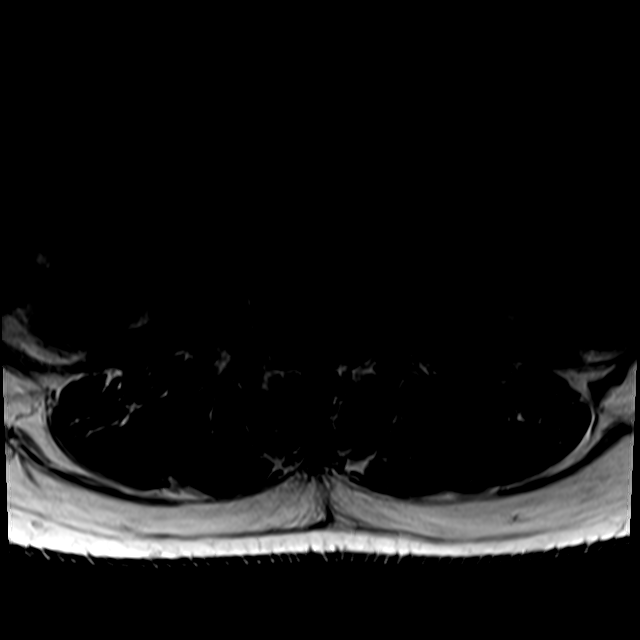

[26 of 48 positions shown; findings below may reference images not displayed]

FINDINGS: Segmentation: Transitional lumbosacral anatomy with partial
lumbarization of the S1 segment. Lowest well developed disc space is
labeled as L5-S1.

Alignment:  Trace retrolisthesis at L5-S1.

Vertebrae:  No fracture, evidence of discitis, or bone lesion.

Conus medullaris and cauda equina: Conus extends to the T12-L1
level. Conus and cauda equina appear normal.

Paraspinal and other soft tissues: Negative.

Disc levels:

T12-L1 through L3-L4: Unremarkable.

L4-L5: Minimal annular disc bulge without focal protrusion.
Bilateral facet arthropathy with ligamentum flavum buckling. No
foraminal or canal stenosis.

L5-S1: Mild annular disc bulge with shallow left paracentral disc
protrusion. Bilateral facet arthropathy and ligamentum flavum
buckling. Moderate left and mild right subarticular recess stenosis.
Mild bilateral foraminal stenosis. No significant canal stenosis.
IMPRESSION: 1. Transitional lumbosacral anatomy with partial lumbarization of
the S1 segment.
2. Mild degenerative disc disease at L5-S1 with moderate left and
mild right subarticular recess stenosis. Mild bilateral foraminal
stenosis at this level.
3. No significant canal stenosis at any level.

## 2022-01-14 MED ORDER — PRAZOSIN HCL 1 MG PO CAPS: 3.0000 mg | ORAL_CAPSULE | Freq: Every day | ORAL | Status: AC

## 2022-01-14 NOTE — Progress Notes (Signed)
EEG complete - results pending 

## 2022-01-14 NOTE — Procedures (Signed)
Routine EEG Report ? ?Jeffrey Barrera. is a 41 y.o. male with a history of conversion disorder who is undergoing an EEG to evaluate for seizures. ? ?Report: This EEG was acquired with electrodes placed according to the International 10-20 electrode system (including Fp1, Fp2, F3, F4, C3, C4, P3, P4, O1, O2, T3, T4, T5, T6, A1, A2, Fz, Cz, Pz). The following electrodes were missing or displaced: none. ? ?The best background was low voltage fast. This activity is reactive to stimulation. Drowsiness was manifested by background fragmentation; deeper stages of sleep were not identified. There was no focal slowing. There were no interictal epileptiform discharges. There were no electrographic seizures identified. Photic stimulation and hyperventilation were not performed. ? ?Impression: This EEG was obtained while awake and drowsy and is normal. ?   ?Clinical Correlation: Normal EEGs, however, do not rule out epilepsy. ? ?[name], MD ?[facility] ?[phone] ? ?If 7pm- 7am, please page neurology on call as listed in AMION. ? ?

## 2022-01-14 NOTE — Progress Notes (Signed)
STROKE TEAM PROGRESS NOTE  ? ?SUBJECTIVE (INTERVAL HISTORY) ?His jail guard and EEG tech are at the bedside.  Overall his condition is gradually improving.  He still complaining of right leg decree sensation and the weakness, slightly improved from yesterday.  He stated that he walked with PT/OT, went to bathroom in the sink and his right leg almost gave out.  However, I talked with PT/OT, they felt patient doing okay, may need a walker but able without a walker he would be also okay for walking by himself.  MRI, MRA brain unremarkable.  On exam, continued to show functional component on the right lower extremity. ? ? ?OBJECTIVE ?Temp:  [97.8 ?F (36.6 ?C)-98 ?F (36.7 ?C)] 98 ?F (36.7 ?C) (04/29 1545) ?Pulse Rate:  [63-79] 68 (04/29 1545) ?Cardiac Rhythm: Normal sinus rhythm (04/29 0813) ?Resp:  [13-22] 18 (04/29 1545) ?BP: (122-150)/(74-94) 124/74 (04/29 1545) ?SpO2:  [96 %-100 %] 98 % (04/29 1545) ? ?Recent Labs  ?Lab 01/13/22 ?1738 01/14/22 ?0745 01/14/22 ?1201 01/14/22 ?1623  ?GLUCAP 100* 145* 121* 117*  ? ?Recent Labs  ?Lab 01/13/22 ?1736 01/13/22 ?1743  ?NA 139 140  ?K 3.6 3.7  ?CL 103 101  ?CO2 27  --   ?GLUCOSE 99 100*  ?BUN 9 10  ?CREATININE 0.83 0.80  ?CALCIUM 10.0  --   ? ?Recent Labs  ?Lab 01/13/22 ?1736  ?AST 19  ?ALT 19  ?ALKPHOS 61  ?BILITOT 0.4  ?PROT 7.5  ?ALBUMIN 3.8  ? ?Recent Labs  ?Lab 01/13/22 ?1736 01/13/22 ?1743  ?WBC 8.5  --   ?NEUTROABS 4.6  --   ?HGB 14.1 14.3  ?HCT 42.4 42.0  ?MCV 91.6  --   ?PLT 391  --   ? ?No results for input(s): CKTOTAL, CKMB, CKMBINDEX, TROPONINI in the last 168 hours. ?Recent Labs  ?  01/13/22 ?1736  ?LABPROT 13.5  ?INR 1.0  ? ?Recent Labs  ?  01/13/22 ?1901  ?COLORURINE YELLOW  ?LABSPEC 1.011  ?PHURINE 5.0  ?GLUCOSEU NEGATIVE  ?HGBUR NEGATIVE  ?BILIRUBINUR NEGATIVE  ?KETONESUR NEGATIVE  ?PROTEINUR NEGATIVE  ?NITRITE NEGATIVE  ?LEUKOCYTESUR NEGATIVE  ?  ?   ?Component Value Date/Time  ? CHOL 139 01/14/2022 0319  ? CHOL 133 09/02/2019 1515  ? TRIG 119 01/14/2022 0319   ? HDL 26 (L) 01/14/2022 0319  ? HDL 26 (L) 09/02/2019 1515  ? CHOLHDL 5.3 01/14/2022 0319  ? VLDL 24 01/14/2022 0319  ? LDLCALC 89 01/14/2022 0319  ? LDLCALC 72 09/02/2019 1515  ? ?Lab Results  ?Component Value Date  ? HGBA1C 5.6 01/14/2022  ? ?   ?Component Value Date/Time  ? LABOPIA NONE DETECTED 01/13/2022 1901  ? COCAINSCRNUR NONE DETECTED 01/13/2022 1901  ? LABBENZ NONE DETECTED 01/13/2022 1901  ? AMPHETMU NONE DETECTED 01/13/2022 1901  ? THCU NONE DETECTED 01/13/2022 1901  ? LABBARB NONE DETECTED 01/13/2022 1901  ?  ?Recent Labs  ?Lab 01/13/22 ?1730  ?ETH <10  ? ? ?I have personally reviewed the radiological images below and agree with the radiology interpretations. ? ?CT CERVICAL SPINE WO CONTRAST ? ?Result Date: 01/13/2022 ?CLINICAL DATA:  Facial trauma, blunt injury. EXAM: CT CERVICAL SPINE WITHOUT CONTRAST TECHNIQUE: Multidetector CT imaging of the cervical spine was performed without intravenous contrast. Multiplanar CT image reconstructions were also generated. RADIATION DOSE REDUCTION: This exam was performed according to the departmental dose-optimization program which includes automated exposure control, adjustment of the mA and/or kV according to patient size and/or use of iterative reconstruction technique. COMPARISON:  June 29, 2020 FINDINGS: Alignment: Mild straightening of normal cervical lordotic curvature is likely related to patient position. Skull base and vertebrae: No acute fracture. No primary bone lesion or focal pathologic process. Soft tissues and spinal canal: No prevertebral fluid or swelling. No visible canal hematoma. Disc levels: Mild-to-moderate degenerative changes in the cervical spine. Mild disc space narrowing at C3-4 and C4-5 as well as C5-6. Moderate facet arthropathy on the RIGHT at C2-3, C3-4 and C4-5. Upper chest: Negative. Other: None IMPRESSION: 1. No acute fracture or static subluxation of the cervical spine. 2. Mild-to-moderate degenerative changes in the cervical  spine. Electronically Signed   By: Donzetta Kohut M.D.   On: 01/13/2022 18:18  ? ?MR ANGIO HEAD WO CONTRAST ? ?Result Date: 01/13/2022 ?CLINICAL DATA:  Multiple falls, right leg numbness EXAM: MRI HEAD WITHOUT CONTRAST MRA HEAD WITHOUT CONTRAST MRA NECK WITHOUT AND WITH CONTRAST TECHNIQUE: Multiplanar, multi-echo pulse sequences of the brain and surrounding structures were acquired without intravenous contrast. Angiographic images of the Circle of Willis were acquired using MRA technique without intravenous contrast. Angiographic images of the neck were acquired using MRA technique without and with intravenous contrast. Carotid stenosis measurements (when applicable) are obtained utilizing NASCET criteria, using the distal internal carotid diameter as the denominator. CONTRAST:  36mL GADAVIST GADOBUTROL 1 MMOL/ML IV SOLN COMPARISON:  None FINDINGS: MRI HEAD Brain: There is no acute infarction or intracranial hemorrhage. There is no intracranial mass, mass effect, or edema. There is no hydrocephalus or extra-axial fluid collection. Ventricles and sulci are within normal limits in size configuration. Punctate focus of T2 hyperintensity in the right frontal white matter likely reflects nonspecific gliosis/demyelination of doubtful significance. Vascular: Major vessel flow voids at the skull base are preserved. Skull and upper cervical spine: Normal marrow signal is preserved. Sinuses/Orbits: Paranasal sinuses are aerated. Orbits are unremarkable. Other: Sella is unremarkable.  Mastoid air cells are clear. MRA HEAD Intracranial internal carotid arteries are patent. Middle and anterior cerebral arteries are patent. Incidental fenestration of right A1 ACA. Intracranial vertebral arteries, basilar artery, posterior cerebral arteries are patent. Bilateral posterior communicating arteries are present. There is no significant stenosis or aneurysm. MRA NECK Included arch is unremarkable. Great vessel origins are patent. Common,  internal, and external carotid arteries are patent. Codominant extracranial vertebral arteries are patent. There is no hemodynamically significant stenosis or evidence of dissection. IMPRESSION: No acute infarction, hemorrhage, or mass. Unremarkable vascular imaging. Electronically Signed   By: Guadlupe Spanish M.D.   On: 01/13/2022 20:50  ? ?MR ANGIO NECK W WO CONTRAST ? ?Result Date: 01/13/2022 ?CLINICAL DATA:  Multiple falls, right leg numbness EXAM: MRI HEAD WITHOUT CONTRAST MRA HEAD WITHOUT CONTRAST MRA NECK WITHOUT AND WITH CONTRAST TECHNIQUE: Multiplanar, multi-echo pulse sequences of the brain and surrounding structures were acquired without intravenous contrast. Angiographic images of the Circle of Willis were acquired using MRA technique without intravenous contrast. Angiographic images of the neck were acquired using MRA technique without and with intravenous contrast. Carotid stenosis measurements (when applicable) are obtained utilizing NASCET criteria, using the distal internal carotid diameter as the denominator. CONTRAST:  23mL GADAVIST GADOBUTROL 1 MMOL/ML IV SOLN COMPARISON:  None FINDINGS: MRI HEAD Brain: There is no acute infarction or intracranial hemorrhage. There is no intracranial mass, mass effect, or edema. There is no hydrocephalus or extra-axial fluid collection. Ventricles and sulci are within normal limits in size configuration. Punctate focus of T2 hyperintensity in the right frontal white matter likely reflects nonspecific gliosis/demyelination of doubtful significance. Vascular: Major  vessel flow voids at the skull base are preserved. Skull and upper cervical spine: Normal marrow signal is preserved. Sinuses/Orbits: Paranasal sinuses are aerated. Orbits are unremarkable. Other: Sella is unremarkable.  Mastoid air cells are clear. MRA HEAD Intracranial internal carotid arteries are patent. Middle and anterior cerebral arteries are patent. Incidental fenestration of right A1 ACA.  Intracranial vertebral arteries, basilar artery, posterior cerebral arteries are patent. Bilateral posterior communicating arteries are present. There is no significant stenosis or aneurysm. MRA NECK Includ

## 2022-01-14 NOTE — Evaluation (Signed)
Physical Therapy Evaluation ?Patient Details ?Name: Jeffrey Barrera. ?MRN: [id number] ?DOB: Apr 11, 1981 ?Today's Date: 01/14/2022 ? ?History of Present Illness ? Pt is a 41 y/o male admitted secondary to RLE numbness. Awaiting MRI of lumbar spine. MRI of head negative. PMH includes HTN, DM, anxiety, asthma, blind in L eye.  ?Clinical Impression ? Pt admitted secondary to problem above with deficits below. Pt requiring min guard to min A for mobility tasks this session. Was able to stand initially without assist to pull pants up, however, on second attempts, pulling up on PT/OT. Reports unable to move RLE for formal MMT, but was able to lift RLE back onto bed without assist. Anticipate pt will progress well with mobility tasks. Will continue to follow acutely.    ?   ? ?Recommendations for follow up therapy are one component of a multi-disciplinary discharge planning process, led by the attending physician.  Recommendations may be updated based on patient status, additional functional criteria and insurance authorization. ? ?Follow Up Recommendations No PT follow up ? ?  ?Assistance Recommended at Discharge Intermittent Supervision/Assistance  ?Patient can return home with the following ?   ? ?  ?Equipment Recommendations Rolling walker (2 wheels)  ?Recommendations for Other Services ?    ?  ?Functional Status Assessment Patient has had a recent decline in their functional status and demonstrates the ability to make significant improvements in function in a reasonable and predictable amount of time.  ? ?  ?Precautions / Restrictions Precautions ?Precautions: Fall ?Restrictions ?Weight Bearing Restrictions: No  ? ?  ? ?Mobility ? Bed Mobility ?Overal bed mobility: Modified Independent ?  ?  ?  ?  ?  ?  ?  ?  ? ?Transfers ?Overall transfer level: Needs assistance ?Equipment used: 2 person hand held assist ?Transfers: Sit to/from Stand ?Sit to Stand: Min guard, Min assist, +2 safety/equipment ?  ?  ?  ?  ?  ?General  transfer comment: was able to stand without assist when pulling up pants, however, when standing second time, pulling up on PT/OT. Min A for lift assist and steadying. ?  ? ?Ambulation/Gait ?Ambulation/Gait assistance: Min guard, Min assist ?Gait Distance (Feet): 10 Feet ?Assistive device: 2 person hand held assist ?Gait Pattern/deviations: Step-to pattern, Decreased stride length, Decreased stance time - right ?Gait velocity: Decreased ?  ?  ?General Gait Details: Pt dragging RLE initially, but then began picking up with increased distance. Min guard to min A for steadying with bilateral HHA. ? ?Stairs ?  ?  ?  ?  ?  ? ?Wheelchair Mobility ?  ? ?Modified Rankin (Stroke Patients Only) ?  ? ?  ? ?Balance Overall balance assessment: Needs assistance ?Sitting-balance support: No upper extremity supported, Feet supported ?Sitting balance-Leahy Scale: Fair ?  ?  ?Standing balance support: Bilateral upper extremity supported, No upper extremity supported ?Standing balance-Leahy Scale: Fair ?Standing balance comment: Able to stand and pull pants up without UE support ?  ?  ?  ?  ?  ?  ?  ?  ?  ?  ?  ?   ? ? ? ?Pertinent Vitals/Pain Pain Assessment ?Pain Assessment: No/denies pain  ? ? ?Home Living Family/patient expects to be discharged to:: [facility] ?  ?  ?  ?  ?  ?  ?  ?  ?  ?   ?  ?Prior Function Prior Level of Function : Independent/Modified Independent ?  ?  ?  ?  ?  ?  ?  ?  ?  ? ? ?  Hand Dominance  ?   ? ?  ?Extremity/Trunk Assessment  ? Upper Extremity Assessment ?Upper Extremity Assessment: Defer to OT evaluation ?  ? ?Lower Extremity Assessment ?Lower Extremity Assessment: RLE deficits/detail ?RLE Deficits / Details: Reports numbness and reports he could not perform LAQ and hip flexion. However, was able to lift RLE back onto bed and stand without assist. ?  ? ?Cervical / Trunk Assessment ?Cervical / Trunk Assessment: Normal  ?Communication  ? Communication: No difficulties  ?Cognition Arousal/Alertness:  Awake/alert ?Behavior During Therapy: [facility] for tasks assessed/performed ?Overall Cognitive Status: Within Functional Limits for tasks assessed ?  ?  ?  ?  ?  ?  ?  ?  ?  ?  ?  ?  ?  ?  ?  ?  ?  ?  ?  ? ?  ?General Comments   ? ?  ?Exercises    ? ?Assessment/Plan  ?  ?PT Assessment Patient needs continued PT services  ?PT Problem List Decreased strength;Decreased balance;Decreased mobility;Impaired sensation ? ?   ?  ?PT Treatment Interventions Gait training;DME instruction;Therapeutic activities;Functional mobility training;Therapeutic exercise;Cognitive remediation;Neuromuscular re-education;Balance training   ? ?PT Goals (Current goals can be found in the Care Plan section)  ?Acute Rehab PT Goals ?Patient Stated Goal: to get better ?PT Goal Formulation: With patient ?Time For Goal Achievement: 01/28/22 ?Potential to Achieve Goals: Good ? ?  ?Frequency Min 3X/week ?  ? ? ?Co-evaluation   ?  ?  ?  ?  ? ? ?  ?AM-PAC PT "6 Clicks" Mobility  ?Outcome Measure Help needed turning from your back to your side while in a flat bed without using bedrails?: A Little ?Help needed moving from lying on your back to sitting on the side of a flat bed without using bedrails?: A Little ?Help needed moving to and from a bed to a chair (including a wheelchair)?: A Little ?Help needed standing up from a chair using your arms (e.g., wheelchair or bedside chair)?: A Little ?Help needed to walk in hospital room?: A Little ?Help needed climbing 3-5 steps with a railing? : A Little ?6 Click Score: 18 ? ?  ?End of Session Equipment Utilized During Treatment: Gait belt ?Activity Tolerance: Patient tolerated treatment well ?Patient left: in bed;with call bell/phone within reach;Other (comment) (officer present) ?Nurse Communication: Mobility status ?PT Visit Diagnosis: Other abnormalities of gait and mobility (R26.89);Other symptoms and signs involving the nervous system (R29.898) ?  ? ?Time: 4081-4481 ?PT Time Calculation (min) (ACUTE ONLY): 25  min ? ? ?Charges:   PT Evaluation ?$PT Eval Moderate Complexity: 1 Mod ?  ?  ?   ? ? ?Farley Ly, PT, DPT  ?Acute Rehabilitation Services  ?Pager: [phone] ?Office: [phone] ? ? ?[place] S Jodilyn Giese ?01/14/2022, 9:46 AM ? ?

## 2022-01-14 NOTE — H&P (Addendum)
?History and Physical  ? ? ?Patient: Jeffrey Barrera. [id number] DOB: March 15, 1981 ?DOA: 01/13/2022 ?DOS: the patient was seen and examined on 01/14/2022 ?PCP: Rebecka Apley, NP  ?Patient coming from:  [place] ? ?Chief Complaint:  ?Chief Complaint  ?Patient presents with  ? Fall  ? ?HPI: Jeffrey Barrera. is a 41 y.o. male with medical history significant of anxiety, asthma, blind in L eye, DM2, CAD s/p stent, questionable prior stroke.  Tobacco abuse, HTN, known lumbar spinal stenosis / radiculopathy with chronic back pain. ? ?Pt seeing pain medicine at [facility] up until Dec last year, dismissed from [facility] due to violating pain contract. ? ?Pt in [place] for "a while" now. ? ?Pt has lost 200lbs since going to [facility].  Weighed 489lbs before going to jail (now only ~250), and used to weigh close to 600lbs a couple of years ago. ? ?Pt presents to the ED today after 3 episodes of falling yesterday and today.  Hit head on floor, no LOC.  Says his R leg was numb and gave out on him. ? ?Initially concern for stroke. ? ?ROS is positive for back pain going down his leg "like someone is sticking an ice pick in my back". ?  ?Review of Systems: As mentioned in the history of present illness. All other systems reviewed and are negative. ?Past Medical History:  ?Diagnosis Date  ? Anxiety   ? Arthritis   ? Asthma   ? Blind left eye   ? Claudication (HCC) 04/02/2017  ? Coronary artery disease involving native coronary artery of native heart without angina pectoris 04/02/2017  ? Depression   ? Essential hypertension 03/07/2017  ? Glucose intolerance (impaired glucose tolerance) 03/15/2017  ? Hypertension   ? Inflamed external hemorrhoid   ? Inflamed internal hemorrhoid   ? Internal and external bleeding hemorrhoids 2019  ? Myocardial infarction [facility])   ? Paresthesia of lower extremity 04/02/2017  ? Pure hypercholesterolemia 03/07/2017  ? Snoring 03/13/2018  ? Tobacco abuse 03/07/2017  ? ?Past Surgical History:  ?Procedure Laterality Date  ?  CARDIAC CATHETERIZATION    ? cardiac stents    ? CORONARY ANGIOPLASTY    ? LEFT HEART CATH AND CORONARY ANGIOGRAPHY N/A 07/11/2018  ? Procedure: LEFT HEART CATH AND CORONARY ANGIOGRAPHY;  Surgeon: Yvonne Kendall, MD;  Location: [facility] INVASIVE CV LAB;  Service: Cardiovascular;  Laterality: N/A;  ? ?Social History:  reports that he has been smoking cigarettes. He quit smokeless tobacco use about 23 years ago.  His smokeless tobacco use included chew. He reports that he does not currently use alcohol. He reports current drug use. Drug: Marijuana. ? ?Allergies  ?Allergen Reactions  ? Asa [Aspirin] Anaphylaxis  ? Lemon Flavor Anaphylaxis  ?  Throat swell up.   ? Other Anaphylaxis and Swelling  ?  Pickle  ? Penicillins Anaphylaxis and Rash  ?  Has patient had a PCN reaction causing immediate rash, facial/tongue/throat swelling, SOB or lightheadedness with hypotension: Yes ?Has patient had a PCN reaction causing severe rash involving mucus membranes or skin necrosis: No ?Has patient had a PCN reaction that required hospitalization: reaction occurred in the hospital ?Has patient had a PCN reaction occurring within the last 10 years: Yes ?If all of the above answers are "NO", then may proceed with Cephalosporin use.  ? Iodine   ? Lisinopril   ?  Kidney failure  ? Sulfabenzamide   ?  Difficulty breathing,blacks out  ? Clindamycin Rash  ? Erythromycin Rash  ? ? ?  Family History  ?Problem Relation Age of Onset  ? Hypertension Mother   ? Heart attack Mother   ? Heart disease Mother   ? Hypertension Father   ? ? ?Prior to Admission medications   ?Medication Sig Start Date End Date Taking? Authorizing Provider  ?amLODipine (NORVASC) 10 MG tablet Take 1 tablet (10 mg total) by mouth daily. 04/08/20 01/13/22 Yes Barbette MerinoKing, Crystal M, NP  ?amoxicillin-clavulanate (AUGMENTIN) 875-125 MG tablet Take 1 tablet by mouth See admin instructions. Bid x 7 days   Yes [provider]  ?carvedilol (COREG) 3.125 MG tablet Take 3.125 mg by mouth 2  (two) times daily with a meal.   Yes [provider]  ?clopidogrel (PLAVIX) 75 MG tablet Take 75 mg by mouth daily.   Yes [provider]  ?doxycycline (VIBRAMYCIN) 100 MG capsule Take 100 mg by mouth See admin instructions. Bid x 10 days   Yes [provider]  ?DULoxetine (CYMBALTA) 60 MG capsule Take 60 mg by mouth daily.   Yes [provider]  ?glipiZIDE (GLUCOTROL XL) 10 MG 24 hr tablet Take 10 mg by mouth daily with breakfast.   Yes [provider]  ?hydrochlorothiazide (HYDRODIURIL) 25 MG tablet Take 25 mg by mouth daily.   Yes [provider]  ?losartan (COZAAR) 100 MG tablet Take 100 mg by mouth daily.   Yes [provider]  ?nitroGLYCERIN (NITROSTAT) 0.4 MG SL tablet Place 1 tablet (0.4 mg total) under the tongue every 5 (five) minutes as needed for chest pain. 07/04/18 01/13/22 Yes Baldo DaubMunley, Brian J, MD  ?prazosin (MINIPRESS) 1 MG capsule Take 1 mg by mouth at bedtime. Take along with 2 mg   Yes [provider]  ?prazosin (MINIPRESS) 2 MG capsule Take 2 mg by mouth at bedtime. Take along with 1 mg   Yes [provider]  ?tamsulosin (FLOMAX) 0.4 MG CAPS capsule Take 0.8 mg by mouth daily.   Yes [provider]  ?atorvastatin (LIPITOR) 40 MG tablet Take 1 tablet (40 mg total) by mouth at bedtime. ?Patient not taking: Reported on 01/13/2022 04/08/20   Barbette MerinoKing, Crystal M, NP  ?buPROPion [facility](WELLBUTRIN SR) 200 MG 12 hr tablet Take 1 tablet (200 mg total) by mouth 2 (two) times daily. ?Patient not taking: Reported on 06/30/2020 04/08/20 04/08/21  Barbette MerinoKing, Crystal M, NP  ?furosemide (LASIX) 40 MG tablet Take 1 tablet (40 mg total) by mouth daily as needed for edema. ?Patient not taking: Reported on 01/13/2022 02/26/20 06/30/20  Barbette MerinoKing, Crystal M, NP  ?gabapentin (NEURONTIN) 400 MG capsule Take 2 capsules (800 mg total) by mouth 3 (three) times daily. ?Patient not taking: Reported on 01/13/2022 04/08/20 04/08/21  Barbette MerinoKing, Crystal M, NP  ?metFORMIN  (GLUCOPHAGE) 500 MG tablet Take 2 tablets (1,000 mg total) by mouth 2 (two) times daily with a meal. ?Patient not taking: Reported on 06/30/2020 04/08/20 04/08/21  Barbette MerinoKing, Crystal M, NP  ?metoprolol tartrate (LOPRESSOR) 25 MG tablet Take 1 tablet (25 mg total) by mouth 2 (two) times daily. ?Patient not taking: Reported on 01/13/2022 04/08/20 04/08/21  Barbette MerinoKing, Crystal M, NP  ?PARoxetine (PAXIL) 20 MG tablet Take 1 tablet (20 mg total) by mouth daily. ?Patient not taking: Reported on 01/13/2022 04/08/20 04/08/21  Barbette MerinoKing, Crystal M, NP  ?SUMAtriptan (IMITREX) 100 MG tablet Take 1 tablet (100 mg total) by mouth every 2 (two) hours as needed for migraine (Max 2 tablets daily). Repeat in 2 hrs if headache persists ?Patient not taking: Reported on 01/13/2022 04/08/20 06/30/20  Barbette Merino, NP  ?traZODone (DESYREL) 150 MG tablet Take 1 tablet (150 mg total) by mouth at bedtime. ?Patient taking differently: Take 200 mg by mouth at bedtime.  04/08/20 04/08/21  Barbette Merino, NP  ? ? ?Physical Exam: ?Vitals:  ? 01/13/22 1957 01/13/22 2000 01/13/22 2124 01/13/22 2130  ?BP: (!) 145/94 (!) 150/77 138/90 (!) 146/86  ?Pulse: 79 68 63 63  ?Resp: (!) 22  18   ?Temp:      ?TempSrc:      ?SpO2: 99% 96% 96% 100%  ? ?Constitutional: NAD, calm, comfortable ?Eyes: PERRL, lids and conjunctivae normal ?ENMT: Mucous membranes are moist. Posterior pharynx clear of any exudate or lesions.Normal dentition.  ?Neck: normal, supple, no masses, no thyromegaly ?Respiratory: clear to auscultation bilaterally, no wheezing, no crackles. Normal respiratory effort. No accessory muscle use.  ?Cardiovascular: Regular rate and rhythm, no murmurs / rubs / gallops. No extremity edema. 2+ pedal pulses. No carotid bruits.  ?Abdomen: no tenderness, no masses palpated. No hepatosplenomegaly. Bowel sounds positive.  ?Musculoskeletal: no clubbing / cyanosis. No joint deformity upper and lower extremities. Good ROM, no contractures. Normal muscle tone.  ?Skin: no rashes,  lesions, ulcers. No induration ?Neurologic: 3/5 strength in R leg ?Psychiatric: Normal judgment and insight. Alert and oriented x 3. Mildly depressed mood.  ? ?Data Reviewed: ? ?MRI brain neg for acute finding

## 2022-01-14 NOTE — ED Notes (Signed)
MRI paged this RN and stated because this pt had a mri study today with contrast they will not be able to scan this patient until around 0730. ?

## 2022-01-14 NOTE — Evaluation (Signed)
Occupational Therapy Evaluation ?Patient Details ?Name: Jeffrey Barrera. ?MRN: [id number] ?DOB: 1981/04/04 ?Today's Date: 01/14/2022 ? ? ?History of Present Illness Pt is a 41 y/o male admitted secondary to RLE numbness. Awaiting MRI of lumbar spine. MRI of head negative. PMH includes HTN, DM, anxiety, asthma, blind in L eye.  ? ?Clinical Impression ?  ?Pt admitted for concerns listed above. PTA pt reported that he was independent with all ADL's and IADL's. At this time, pt is limited by RLE numbness and weakness. Pt demonstrates difficulty progressing RLE forward with gait, as well as lifting it when sitting EOB. In bed and returning to supine, pt is able to lift leg to don socks and pants, as well as bringing BLE back into bed. Pt also reporting dizziness in sitting and standing with BP remaining stable. OT will follow acutely to maximize pt independence and safety.   ?   ? ?Recommendations for follow up therapy are one component of a multi-disciplinary discharge planning process, led by the attending physician.  Recommendations may be updated based on patient status, additional functional criteria and insurance authorization.  ? ?Follow Up Recommendations ? No OT follow up  ?  ?Assistance Recommended at Discharge PRN  ?Patient can return home with the following   ? ?  ?Functional Status Assessment ? Patient has had a recent decline in their functional status and demonstrates the ability to make significant improvements in function in a reasonable and predictable amount of time.  ?Equipment Recommendations ? Other (comment) (RW?)  ?  ?Recommendations for Other Services   ? ? ?  ?Precautions / Restrictions Precautions ?Precautions: Fall ?Restrictions ?Weight Bearing Restrictions: No  ? ?  ? ?Mobility Bed Mobility ?Overal bed mobility: Modified Independent ?  ?  ?  ?  ?  ?  ?  ?  ? ?Transfers ?Overall transfer level: Needs assistance ?Equipment used: 2 person hand held assist ?Transfers: Sit to/from Stand ?Sit to  Stand: Min guard, Min assist, +2 safety/equipment ?  ?  ?  ?  ?  ?General transfer comment: was able to stand without assist when pulling up pants, however, when standing second time, pulling up on PT/OT. Min A for lift assist and steadying. ?  ? ?  ?Balance Overall balance assessment: Needs assistance ?Sitting-balance support: No upper extremity supported, Feet supported ?Sitting balance-Leahy Scale: Fair ?  ?  ?Standing balance support: Bilateral upper extremity supported, No upper extremity supported ?Standing balance-Leahy Scale: Fair ?Standing balance comment: Able to stand and pull pants up without UE support ?  ?  ?  ?  ?  ?  ?  ?  ?  ?  ?  ?   ? ?ADL either performed or assessed with clinical judgement  ? ?ADL Overall ADL's : Needs assistance/impaired ?Eating/Feeding: Set up;Sitting ?  ?Grooming: Set up;Sitting ?  ?Upper Body Bathing: Set up;Sitting ?  ?Lower Body Bathing: Minimal assistance;Sitting/lateral leans;Sit to/from stand ?  ?Upper Body Dressing : Set up;Sitting ?  ?Lower Body Dressing: Set up;Sitting/lateral leans;Min guard;Sit to/from stand ?  ?Toilet Transfer: Minimal assistance ?  ?Toileting- Clothing Manipulation and Hygiene: Min guard;Sitting/lateral lean ?  ?  ?  ?Functional mobility during ADLs: Minimal assistance ?General ADL Comments: Pt demonstrates poor balance, difficulty with mobility/standing tasks due to balance, and set up for seated tasks due to visual deficits.  ? ? ? ?Vision Baseline Vision/History: 2 Legally blind ?Ability to See in Adequate Light: 2 Moderately impaired ?Patient Visual Report: Diplopia;Other (comment) (dizziness) ?Additional Comments:  Pt reports complete blindness in L eye and R eye is slowly going blind as well. Tracking WFL, did not run into anything. Reports dizziness with sitting and standing - BP normal.  ?   ?Perception   ?  ?Praxis   ?  ? ?Pertinent Vitals/Pain Pain Assessment ?Pain Assessment: No/denies pain  ? ? ? ?Hand Dominance Right ?   ?Extremity/Trunk Assessment Upper Extremity Assessment ?Upper Extremity Assessment: Overall WFL for tasks assessed ?  ?Lower Extremity Assessment ?Lower Extremity Assessment: Defer to PT evaluation ?RLE Deficits / Details: Reports numbness and reports he could not perform LAQ and hip flexion. However, was able to lift RLE back onto bed and stand without assist. ?  ?Cervical / Trunk Assessment ?Cervical / Trunk Assessment: Normal ?  ?Communication Communication ?Communication: No difficulties ?  ?Cognition Arousal/Alertness: Awake/alert ?Behavior During Therapy: [facility] for tasks assessed/performed ?Overall Cognitive Status: Within Functional Limits for tasks assessed ?  ?  ?  ?  ?  ?  ?  ?  ?  ?  ?  ?  ?  ?  ?  ?  ?  ?  ?  ?General Comments  VSS on RA, Pt reporting dizziness, however BP remains stable ? ?  ?Exercises   ?  ?Shoulder Instructions    ? ? ?Home Living Family/patient expects to be discharged to:: [facility] ?  ?  ?  ?  ?  ?  ?  ?  ?  ?  ?  ?  ?  ?  ?  ?  ?  ?  ? ?  ?Prior Functioning/Environment Prior Level of Function : Independent/Modified Independent ?  ?  ?  ?  ?  ?  ?  ?  ?  ? ?  ?  ?OT Problem List: Decreased strength;Decreased activity tolerance;Impaired balance (sitting and/or standing);Impaired vision/perception;Decreased safety awareness;Impaired sensation ?  ?   ?OT Treatment/Interventions: Self-care/ADL training;Therapeutic exercise;Energy conservation;DME and/or AE instruction;Visual/perceptual remediation/compensation;Patient/family education;Balance training  ?  ?OT Goals(Current goals can be found in the care plan section) Acute Rehab OT Goals ?Patient Stated Goal: to walk again ?OT Goal Formulation: With patient ?Time For Goal Achievement: 01/28/22 ?Potential to Achieve Goals: Good ?ADL Goals ?Pt Will Transfer to Toilet: Independently;ambulating ?Pt Will Perform Tub/Shower Transfer: Independently;ambulating  ?OT Frequency: Min 2X/week ?  ? ?Co-evaluation   ?  ?  ?  ?  ? ?  ?AM-PAC OT  "6 Clicks" Daily Activity     ?Outcome Measure Help from another person eating meals?: A Little ?Help from another person taking care of personal grooming?: A Little ?Help from another person toileting, which includes using toliet, bedpan, or urinal?: A Little ?Help from another person bathing (including washing, rinsing, drying)?: A Little ?Help from another person to put on and taking off regular upper body clothing?: A Little ?Help from another person to put on and taking off regular lower body clothing?: A Little ?6 Click Score: 18 ?  ?End of Session Equipment Utilized During Treatment: Gait belt ?Nurse Communication: Mobility status ? ?Activity Tolerance: Patient tolerated treatment well ?Patient left: in bed;with call bell/phone within reach;with family/visitor present ? ?OT Visit Diagnosis: Unsteadiness on feet (R26.81);Other abnormalities of gait and mobility (R26.89);Muscle weakness (generalized) (M62.81)  ?              ?Time: [id number] ?OT Time Calculation (min): 23 min ?Charges:  OT General Charges ?$OT Visit: 1 Visit ?OT Evaluation ?$OT Eval Moderate Complexity: 1 Mod ? ?Bexley Laubach H., OTR/L ?Acute Rehabilitation ? ?  Bertie Mcconathy Elane Yolanda Bonine ?01/14/2022, 10:24 AM ?

## 2022-01-14 NOTE — Progress Notes (Signed)
Patient is OTF currently. ?

## 2022-01-14 NOTE — Progress Notes (Signed)
Patient d/c [facility] to jail. Patient's d/c instructions given by dayshift RN. Racheal Patches RN  ?

## 2022-01-14 NOTE — Discharge Summary (Signed)
?Physician Discharge Summary ?  ?Patient: Jeffrey Barrera. MRN: [id number] DOB: 12-Dec-1980  ?Admit date:     01/13/2022  ?Discharge date: 01/14/22  ?Discharge Physician: Lewie Chamber  ? ?PCP: Rebecka Apley, NP  ? ?Recommendations at discharge:  ? ?DMII can be diet controlled given low A1c ?No organic cause found for RLE weakness. Would consider referral to psych as needed if ongoing symptoms. Concern for malingering vs conversion. Hoover's test positive  ? ?Discharge Diagnoses: ?Principal Problem: ?  Paresthesia of lower extremity ?Active Problems: ?  Essential hypertension ?  BPH (benign prostatic hyperplasia) ?  DM2 (diabetes mellitus, type 2) (HCC) ? ?Resolved Problems: ?  * No resolved hospital problems. * ? ?Hospital Course: ?[name] is a 41 yo male with PMH anxiety, asthma, L eye blindness, CAD, DMII, ?hx CVA, tobacco use, HTN, lumbar spinal stenosis/radiculopathy, chronic LBP who presented with falling and RLE weakness/numbness.  ?In the past he was on a pain contract with [facility] until last December and was dismissed for violating pain contract.  He has also lost at least 200 to 300 pounds over the past couple years.  ?He was evaluated by neurology and admitted for stroke work-up. ?MRI brain as well as MRA head/neck were negative for stroke or acute abnormalities.  His symptoms were considered nonorganic from a neurological standpoint and he was considered stable for discharging.  For further evaluation, MRI L-spine was also ordered. This showed mild DDD L5-S1 with moderate left and mild right subarticular recess stenosis. There was no significant canal stenosis at any level. His weakness was considered non-organic and no further workup was indicated. A rolling walker was ordered but patient does have the ability to lift RLE when needed without assist as per PT evaluation.  ?He was considered stable for discharge from the hospital and can follow up with primary care at discharge as needed.   ? ?Assessment and Plan: ?* Paresthesia of lower extremity ?- RLE numbness and weakness per patient ?-MRI brain as well as MRA head/neck are negative for stroke or acute abnormalities ?-He has been evaluated by neurology as well and also evaluated by PT.  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  ? ?DM2 (diabetes mellitus, type 2) (HCC) ?- on glipizide prior to admission but given A1c 5.6% likely can be controlled with ongoing diet and his history of dramatic weight loss ? ?BPH (benign prostatic hyperplasia) ?- Severe despite young age ?-Continue prazosin and Flomax ? ?Essential hypertension ?- Controlled, continue prazosin ? ? ? ? ?  ? ? ?Consultants: Neurology ?Procedures performed:   ?Disposition:  Jail ?Diet recommendation:  ?Discharge Diet Orders (From admission, onward)  ? ?  Start     Ordered  ? 01/14/22 0000  Diet - low sodium heart healthy       ? 01/14/22 1757  ? ?  ?  ? ?  ? ?Carb modified diet ?DISCHARGE MEDICATION: ?Allergies as of 01/14/2022   ? ?   Reactions  ? Asa [aspirin] Anaphylaxis  ?  Lemon Flavor Anaphylaxis  ? Throat swell up.   ? Other Anaphylaxis, Swelling  ? Pickle  ? Penicillins Anaphylaxis, Rash  ? Has patient had a PCN reaction causing immediate rash, facial/tongue/throat swelling, SOB or lightheadedness with hypotension: Yes ?Has patient had a PCN reaction causing severe rash involving mucus membranes or skin necrosis: No ?Has patient had a PCN reaction that  required hospitalization: reaction occurred in the hospital ?Has patient had a PCN reaction occurring within the last 10 years: Yes ?If all of the above answers are "NO", then may proceed with Cephalosporin use.  ? Iodine   ? Lisinopril   ? Kidney failure  ? Sulfabenzamide   ? Difficulty breathing,blacks out  ? Clindamycin Rash  ? Erythromycin Rash  ? ?  ? ?  ?Medication List  ?  ? ?STOP taking these medications   ? ?amLODipine 10 MG tablet ?Commonly known as: NORVASC ?  ?amoxicillin-clavulanate 875-125 MG tablet ?Commonly known as: AUGMENTIN ?  ?atorvastatin 40 MG tablet ?Commonly known as: LIPITOR ?  ?buPROPion 200 MG 12 hr tablet ?Commonly known as: WELLBUTRIN SR ?  ?carvedilol 3.125 MG tablet ?Commonly known as: COREG ?  ?doxycycline 100 MG capsule ?Commonly known as: VIBRAMYCIN ?  ?furosemide 40 MG tablet ?Commonly known as: LASIX ?  ?gabapentin 400 MG capsule ?Commonly known as: NEURONTIN ?  ?glipiZIDE 10 MG 24 hr tablet ?Commonly known as: GLUCOTROL XL ?  ?hydrochlorothiazide 25 MG tablet ?Commonly known as: HYDRODIURIL ?  ?losartan 100 MG tablet ?Commonly known as: COZAAR ?  ?metFORMIN 500 MG tablet ?Commonly known as: GLUCOPHAGE ?  ?metoprolol tartrate 25 MG tablet ?Commonly known as: LOPRESSOR ?  ?PARoxetine 20 MG tablet ?Commonly known as: Paxil ?  ?SUMAtriptan 100 MG tablet ?Commonly known as: IMITREX ?  ? ?  ? ?TAKE these medications   ? ?clopidogrel 75 MG tablet ?Commonly known as: PLAVIX ?Take 75 mg by mouth daily. ?  ?DULoxetine 60 MG capsule ?Commonly known as: CYMBALTA ?Take 60 mg by mouth daily. ?  ?nitroGLYCERIN 0.4 MG SL tablet ?Commonly known as: NITROSTAT ?Place 1 tablet (0.4 mg total) under the tongue every 5 (five) minutes as needed for chest pain. ?  ?prazosin 1 MG capsule ?Commonly known as: MINIPRESS ?Take 3 capsules (3 mg total) by mouth at bedtime. ?What changed:  ?how much to take ?additional instructions ?Another medication with the same name was removed. Continue taking this  medication, and follow the directions you see here. ?  ?tamsulosin 0.4 MG Caps capsule ?Commonly known as: FLOMAX ?Take 0.8 mg by mouth daily. ?  ?traZODone 150 MG tablet ?Commonly known as: DESYREL ?Take 1 tablet (150 mg total) by mouth at bedtime. ?What changed: how much to take ?  ? ?  ? ?  ?  ? ? ?  ?Durable Medical Equipment  ?(From admission, onward)  ?  ? ? ?  ? ?  Start     Ordered  ? 01/14/22 0000  For home use only DME Walker rolling       ?Question Answer Comment  ?Walker: With 5 Inch Wheels   ?Patient needs a walker to treat with the following condition Generalized muscle weakness   ?  ? 01/14/22 1757  ? ?  ?  ? ?  ? ? ?Discharge Exam: ? ?  01/14/2022  ?  3:45 PM 01/14/2022  ? 11:31 AM 01/14/2022  ?  7:54 AM  ?Vitals with BMI  ?Systolic 124 [id number]  ?Diastolic 74 78 77  ?  Pulse 68 67 75  ? Physical Exam ?Constitutional:   ?   General: He is not in acute distress. ?   Appearance: Normal appearance.  ?HENT:  ?   Head: Normocephalic and atraumatic.  ?   Mouth/Throat:  ?   Mouth: Mucous membranes are moist.  ?Eyes:  ?   Extraocular Movements: Extraocular movements intact.  ?Cardiovascular:  ?   Rate and Rhythm: Normal rate and regular rhythm.  ?   Heart sounds: Normal heart sounds.  ?Pulmonary:  ?   Effort: Pulmonary effort is normal. No respiratory distress.  ?   Breath sounds: Normal breath sounds. No wheezing.  ?Abdominal:  ?   General: Bowel sounds are normal. There is no distension.  ?   Palpations: Abdomen is soft.  ?   Tenderness: There is no abdominal tenderness.  ?Musculoskeletal:     ?   General: Normal range of motion.  ?   Cervical back: Normal range of motion and neck supple.  ?Skin: ?   General: Skin is warm and dry.  ?Neurological:  ?   Mental Status: He is alert.  ?   Comments: Functional RLE weakness and paresthesia per patient. Hoover test positive  ?Psychiatric:     ?   Mood and Affect: Mood normal.     ?   Behavior: Behavior normal.  ? ? ?Condition at discharge: stable ? ?The results of  significant diagnostics from this hospitalization (including imaging, microbiology, ancillary and laboratory) are listed below for reference.  ? ?Imaging Studies: ?CT CERVICAL SPINE WO CONTRAST ? ?Result Date: 01/13/2022 ?CLIN

## 2022-01-14 NOTE — Hospital Course (Addendum)
[name] is a 41 yo male with PMH anxiety, asthma, L eye blindness, CAD, DMII, ?hx CVA, tobacco use, HTN, lumbar spinal stenosis/radiculopathy, chronic LBP who presented with falling and RLE weakness/numbness.  ?In the past he was on a pain contract with [facility] until last December and was dismissed for violating pain contract.  He has also lost at least 200 to 300 pounds over the past couple years.  ?He was evaluated by neurology and admitted for stroke work-up. ?MRI brain as well as MRA head/neck were negative for stroke or acute abnormalities.  His symptoms were considered nonorganic from a neurological standpoint and he was considered stable for discharging.  For further evaluation, MRI L-spine was also ordered. This showed mild DDD L5-S1 with moderate left and mild right subarticular recess stenosis. There was no significant canal stenosis at any level. His weakness was considered non-organic and no further workup was indicated. A rolling walker was ordered but patient does have the ability to lift RLE when needed without assist as per PT evaluation.  ?He was considered stable for discharge from the hospital and can follow up with primary care at discharge as needed.  ?

## 2022-01-14 NOTE — Progress Notes (Signed)
*  PRELIMINARY RESULTS* ?Echocardiogram ?2D Echocardiogram has been performed. ? ?Jeffrey Barrera ?01/14/2022, 3:32 PM ?
